# Patient Record
Sex: Female | Born: 1935 | Race: White | Hispanic: No | Smoking: Former smoker
Health system: Southern US, Community
[De-identification: ages and names within clinical notes are randomized; demographics above are authoritative.]

## PROBLEM LIST (undated history)

## (undated) DIAGNOSIS — K5730 Diverticulosis of large intestine without perforation or abscess without bleeding: Secondary | ICD-10-CM

## (undated) DIAGNOSIS — K644 Residual hemorrhoidal skin tags: Secondary | ICD-10-CM

## (undated) DIAGNOSIS — M8580 Other specified disorders of bone density and structure, unspecified site: Secondary | ICD-10-CM

## (undated) DIAGNOSIS — D751 Secondary polycythemia: Secondary | ICD-10-CM

## (undated) DIAGNOSIS — I4891 Unspecified atrial fibrillation: Secondary | ICD-10-CM

## (undated) DIAGNOSIS — E785 Hyperlipidemia, unspecified: Secondary | ICD-10-CM

## (undated) DIAGNOSIS — C549 Malignant neoplasm of corpus uteri, unspecified: Secondary | ICD-10-CM

## (undated) DIAGNOSIS — J189 Pneumonia, unspecified organism: Secondary | ICD-10-CM

## (undated) DIAGNOSIS — I10 Essential (primary) hypertension: Secondary | ICD-10-CM

## (undated) DIAGNOSIS — L03039 Cellulitis of unspecified toe: Secondary | ICD-10-CM

## (undated) DIAGNOSIS — I517 Cardiomegaly: Secondary | ICD-10-CM

## (undated) DIAGNOSIS — I639 Cerebral infarction, unspecified: Secondary | ICD-10-CM

## (undated) DIAGNOSIS — D126 Benign neoplasm of colon, unspecified: Secondary | ICD-10-CM

## (undated) HISTORY — DX: Residual hemorrhoidal skin tags: K64.4

## (undated) HISTORY — DX: Other specified disorders of bone density and structure, unspecified site: M85.80

## (undated) HISTORY — DX: Cellulitis of unspecified toe: L03.039

## (undated) HISTORY — PX: TONSILLECTOMY: SUR1361

## (undated) HISTORY — DX: Pneumonia, unspecified organism: J18.9

## (undated) HISTORY — PX: GYNECOLOGIC CRYOSURGERY: SHX857

## (undated) HISTORY — DX: Cerebral infarction, unspecified: I63.9

## (undated) HISTORY — PX: LUMBAR DISC SURGERY: SHX700

## (undated) HISTORY — DX: Hyperlipidemia, unspecified: E78.5

## (undated) HISTORY — DX: Essential (primary) hypertension: I10

## (undated) HISTORY — DX: Unspecified atrial fibrillation: I48.91

## (undated) HISTORY — DX: Benign neoplasm of colon, unspecified: D12.6

## (undated) HISTORY — DX: Diverticulosis of large intestine without perforation or abscess without bleeding: K57.30

## (undated) HISTORY — PX: CATARACT EXTRACTION: SUR2

## (undated) HISTORY — PX: REFRACTIVE SURGERY: SHX103

## (undated) HISTORY — DX: Cardiomegaly: I51.7

## (undated) HISTORY — DX: Secondary polycythemia: D75.1

## (undated) HISTORY — DX: Malignant neoplasm of corpus uteri, unspecified: C54.9

---

## 1999-01-08 ENCOUNTER — Other Ambulatory Visit: Admission: RE | Admit: 1999-01-08 | Discharge: 1999-01-08 | Payer: Self-pay | Admitting: Family Medicine

## 2000-03-17 ENCOUNTER — Other Ambulatory Visit: Admission: RE | Admit: 2000-03-17 | Discharge: 2000-03-17 | Payer: Self-pay | Admitting: Family Medicine

## 2001-04-29 ENCOUNTER — Other Ambulatory Visit: Admission: RE | Admit: 2001-04-29 | Discharge: 2001-04-29 | Payer: Self-pay | Admitting: Family Medicine

## 2002-04-27 ENCOUNTER — Other Ambulatory Visit: Admission: RE | Admit: 2002-04-27 | Discharge: 2002-04-27 | Payer: Self-pay | Admitting: Family Medicine

## 2003-06-13 ENCOUNTER — Other Ambulatory Visit: Admission: RE | Admit: 2003-06-13 | Discharge: 2003-06-13 | Payer: Self-pay | Admitting: Family Medicine

## 2003-09-22 HISTORY — PX: TOTAL ABDOMINAL HYSTERECTOMY W/ BILATERAL SALPINGOOPHORECTOMY: SHX83

## 2004-07-22 ENCOUNTER — Ambulatory Visit: Payer: Self-pay | Admitting: Family Medicine

## 2004-08-28 ENCOUNTER — Ambulatory Visit: Payer: Self-pay | Admitting: Family Medicine

## 2004-09-12 ENCOUNTER — Ambulatory Visit: Payer: Self-pay | Admitting: Family Medicine

## 2004-09-16 ENCOUNTER — Ambulatory Visit: Payer: Self-pay | Admitting: Family Medicine

## 2004-09-18 ENCOUNTER — Ambulatory Visit: Payer: Self-pay | Admitting: Family Medicine

## 2004-09-25 ENCOUNTER — Ambulatory Visit: Payer: Self-pay | Admitting: Family Medicine

## 2004-10-23 ENCOUNTER — Ambulatory Visit: Payer: Self-pay | Admitting: Family Medicine

## 2004-11-11 ENCOUNTER — Ambulatory Visit: Payer: Self-pay | Admitting: Family Medicine

## 2004-11-12 ENCOUNTER — Ambulatory Visit: Payer: Self-pay | Admitting: Family Medicine

## 2004-11-12 ENCOUNTER — Other Ambulatory Visit: Admission: RE | Admit: 2004-11-12 | Discharge: 2004-11-12 | Payer: Self-pay | Admitting: Family Medicine

## 2004-11-19 ENCOUNTER — Ambulatory Visit (HOSPITAL_COMMUNITY): Admission: RE | Admit: 2004-11-19 | Discharge: 2004-11-19 | Payer: Self-pay | Admitting: Gynecology

## 2004-11-20 ENCOUNTER — Ambulatory Visit: Payer: Self-pay | Admitting: Internal Medicine

## 2004-11-24 ENCOUNTER — Ambulatory Visit: Payer: Self-pay

## 2004-12-04 ENCOUNTER — Encounter (INDEPENDENT_AMBULATORY_CARE_PROVIDER_SITE_OTHER): Payer: Self-pay | Admitting: *Deleted

## 2004-12-05 ENCOUNTER — Inpatient Hospital Stay (HOSPITAL_COMMUNITY): Admission: RE | Admit: 2004-12-05 | Discharge: 2004-12-06 | Payer: Self-pay | Admitting: Gynecology

## 2004-12-19 ENCOUNTER — Ambulatory Visit: Payer: Self-pay | Admitting: Family Medicine

## 2005-01-16 ENCOUNTER — Ambulatory Visit: Payer: Self-pay | Admitting: Family Medicine

## 2005-01-30 ENCOUNTER — Ambulatory Visit: Payer: Self-pay | Admitting: Family Medicine

## 2005-02-13 ENCOUNTER — Ambulatory Visit: Admission: RE | Admit: 2005-02-13 | Discharge: 2005-05-14 | Payer: Self-pay | Admitting: Radiation Oncology

## 2005-03-02 ENCOUNTER — Ambulatory Visit: Payer: Self-pay | Admitting: Family Medicine

## 2005-03-20 ENCOUNTER — Ambulatory Visit: Payer: Self-pay | Admitting: Family Medicine

## 2005-03-30 ENCOUNTER — Ambulatory Visit: Payer: Self-pay | Admitting: Family Medicine

## 2005-04-13 ENCOUNTER — Ambulatory Visit: Payer: Self-pay | Admitting: Family Medicine

## 2005-04-23 ENCOUNTER — Ambulatory Visit (HOSPITAL_COMMUNITY): Admission: RE | Admit: 2005-04-23 | Discharge: 2005-04-23 | Payer: Self-pay | Admitting: Radiation Oncology

## 2005-04-27 ENCOUNTER — Ambulatory Visit: Payer: Self-pay | Admitting: Family Medicine

## 2005-04-30 ENCOUNTER — Ambulatory Visit (HOSPITAL_COMMUNITY): Admission: RE | Admit: 2005-04-30 | Discharge: 2005-04-30 | Payer: Self-pay | Admitting: Radiation Oncology

## 2005-05-07 ENCOUNTER — Ambulatory Visit (HOSPITAL_COMMUNITY): Admission: RE | Admit: 2005-05-07 | Discharge: 2005-05-07 | Payer: Self-pay | Admitting: Radiation Oncology

## 2005-05-11 ENCOUNTER — Ambulatory Visit: Payer: Self-pay | Admitting: Family Medicine

## 2005-05-26 ENCOUNTER — Ambulatory Visit: Payer: Self-pay | Admitting: Family Medicine

## 2005-05-29 ENCOUNTER — Ambulatory Visit: Payer: Self-pay | Admitting: Internal Medicine

## 2005-06-09 ENCOUNTER — Ambulatory Visit: Payer: Self-pay | Admitting: Family Medicine

## 2005-06-22 ENCOUNTER — Ambulatory Visit: Admission: RE | Admit: 2005-06-22 | Discharge: 2005-06-26 | Payer: Self-pay | Admitting: Radiation Oncology

## 2005-06-23 ENCOUNTER — Ambulatory Visit (HOSPITAL_COMMUNITY): Admission: RE | Admit: 2005-06-23 | Discharge: 2005-06-23 | Payer: Self-pay | Admitting: Radiation Oncology

## 2005-07-07 ENCOUNTER — Ambulatory Visit: Payer: Self-pay | Admitting: Family Medicine

## 2005-07-13 ENCOUNTER — Encounter: Payer: Self-pay | Admitting: Family Medicine

## 2005-07-13 LAB — CONVERTED CEMR LAB: Pap Smear: ABNORMAL

## 2005-08-04 ENCOUNTER — Ambulatory Visit: Payer: Self-pay | Admitting: Family Medicine

## 2005-09-01 ENCOUNTER — Ambulatory Visit: Payer: Self-pay | Admitting: Family Medicine

## 2005-10-02 ENCOUNTER — Ambulatory Visit: Payer: Self-pay | Admitting: Family Medicine

## 2005-10-06 ENCOUNTER — Ambulatory Visit: Payer: Self-pay | Admitting: Family Medicine

## 2005-10-12 ENCOUNTER — Ambulatory Visit: Payer: Self-pay | Admitting: Internal Medicine

## 2005-10-14 ENCOUNTER — Ambulatory Visit: Payer: Self-pay | Admitting: Family Medicine

## 2005-10-22 ENCOUNTER — Ambulatory Visit: Payer: Self-pay | Admitting: Internal Medicine

## 2005-10-22 ENCOUNTER — Encounter: Payer: Self-pay | Admitting: Internal Medicine

## 2005-10-22 ENCOUNTER — Encounter (INDEPENDENT_AMBULATORY_CARE_PROVIDER_SITE_OTHER): Payer: Self-pay | Admitting: Specialist

## 2005-10-22 DIAGNOSIS — D126 Benign neoplasm of colon, unspecified: Secondary | ICD-10-CM

## 2005-10-22 HISTORY — DX: Benign neoplasm of colon, unspecified: D12.6

## 2005-10-22 HISTORY — PX: COLONOSCOPY: SHX174

## 2005-10-28 ENCOUNTER — Ambulatory Visit: Payer: Self-pay | Admitting: Family Medicine

## 2005-11-18 ENCOUNTER — Ambulatory Visit: Payer: Self-pay | Admitting: Family Medicine

## 2005-12-02 ENCOUNTER — Ambulatory Visit: Payer: Self-pay | Admitting: Family Medicine

## 2006-01-06 ENCOUNTER — Ambulatory Visit: Payer: Self-pay | Admitting: Family Medicine

## 2006-02-03 ENCOUNTER — Ambulatory Visit: Payer: Self-pay | Admitting: Family Medicine

## 2006-02-17 ENCOUNTER — Ambulatory Visit: Payer: Self-pay | Admitting: Family Medicine

## 2006-03-03 ENCOUNTER — Ambulatory Visit: Payer: Self-pay | Admitting: Family Medicine

## 2006-03-17 ENCOUNTER — Ambulatory Visit: Payer: Self-pay | Admitting: Family Medicine

## 2006-03-30 ENCOUNTER — Ambulatory Visit (HOSPITAL_COMMUNITY): Admission: RE | Admit: 2006-03-30 | Discharge: 2006-03-30 | Payer: Self-pay | Admitting: Obstetrics and Gynecology

## 2006-03-31 ENCOUNTER — Ambulatory Visit: Payer: Self-pay | Admitting: Family Medicine

## 2006-04-02 ENCOUNTER — Ambulatory Visit (HOSPITAL_COMMUNITY): Admission: RE | Admit: 2006-04-02 | Discharge: 2006-04-02 | Payer: Self-pay | Admitting: Obstetrics and Gynecology

## 2006-04-06 ENCOUNTER — Ambulatory Visit: Payer: Self-pay | Admitting: Family Medicine

## 2006-04-28 ENCOUNTER — Ambulatory Visit: Payer: Self-pay | Admitting: Family Medicine

## 2006-05-26 ENCOUNTER — Ambulatory Visit: Payer: Self-pay | Admitting: Family Medicine

## 2006-06-09 ENCOUNTER — Ambulatory Visit: Payer: Self-pay | Admitting: Internal Medicine

## 2006-06-09 ENCOUNTER — Ambulatory Visit: Payer: Self-pay | Admitting: Family Medicine

## 2006-06-30 ENCOUNTER — Ambulatory Visit: Payer: Self-pay | Admitting: Family Medicine

## 2006-07-09 ENCOUNTER — Ambulatory Visit: Payer: Self-pay | Admitting: Family Medicine

## 2006-07-22 ENCOUNTER — Ambulatory Visit: Payer: Self-pay | Admitting: Family Medicine

## 2006-08-13 ENCOUNTER — Ambulatory Visit: Payer: Self-pay | Admitting: Family Medicine

## 2006-09-10 ENCOUNTER — Ambulatory Visit: Payer: Self-pay | Admitting: Family Medicine

## 2006-10-06 ENCOUNTER — Ambulatory Visit: Payer: Self-pay | Admitting: Family Medicine

## 2006-10-06 LAB — CONVERTED CEMR LAB
ALT: 14 units/L (ref 0–40)
AST: 18 units/L (ref 0–37)
Albumin: 3.9 g/dL (ref 3.5–5.2)
Alkaline Phosphatase: 66 units/L (ref 39–117)
BUN: 21 mg/dL (ref 6–23)
Bilirubin, Direct: 0.1 mg/dL (ref 0.0–0.3)
CO2: 33 meq/L — ABNORMAL HIGH (ref 19–32)
Calcium: 9.8 mg/dL (ref 8.4–10.5)
Chloride: 96 meq/L (ref 96–112)
Cholesterol: 164 mg/dL (ref 0–200)
Creatinine, Ser: 1.1 mg/dL (ref 0.4–1.2)
GFR calc Af Amer: 62 mL/min
GFR calc non Af Amer: 51 mL/min
Glucose, Bld: 117 mg/dL — ABNORMAL HIGH (ref 70–99)
HCT: 48 % — ABNORMAL HIGH (ref 36.0–46.0)
HDL: 64.2 mg/dL (ref >39.0)
Hemoglobin: 15.3 g/dL — ABNORMAL HIGH (ref 12.0–15.0)
LDL Cholesterol: 72 mg/dL (ref 0–99)
MCHC: 32 g/dL (ref 30.0–36.0)
MCV: 89.6 fL (ref 78.0–100.0)
Phosphorus: 3.4 mg/dL (ref 2.3–4.6)
Platelets: 243 10*3/uL (ref 150–400)
Potassium: 4 meq/L (ref 3.5–5.1)
RBC: 5.36 M/uL — ABNORMAL HIGH (ref 3.87–5.11)
RDW: 12.5 % (ref 11.5–14.6)
Sodium: 137 meq/L (ref 135–145)
TSH: 2.44 microintl units/mL (ref 0.35–5.50)
Total Bilirubin: 0.8 mg/dL (ref 0.3–1.2)
Total CHOL/HDL Ratio: 2.6
Total Protein: 7.1 g/dL (ref 6.0–8.3)
Triglycerides: 140 mg/dL (ref 0–149)
VLDL: 28 mg/dL (ref 0–40)
WBC: 10.5 10*3/uL (ref 4.5–10.5)

## 2006-10-08 ENCOUNTER — Ambulatory Visit: Payer: Self-pay | Admitting: Family Medicine

## 2006-10-14 ENCOUNTER — Ambulatory Visit: Payer: Self-pay | Admitting: Family Medicine

## 2006-10-22 ENCOUNTER — Ambulatory Visit: Payer: Self-pay | Admitting: Family Medicine

## 2006-10-29 ENCOUNTER — Ambulatory Visit: Payer: Self-pay | Admitting: Family Medicine

## 2006-11-11 ENCOUNTER — Ambulatory Visit: Payer: Self-pay | Admitting: Family Medicine

## 2006-12-09 ENCOUNTER — Ambulatory Visit: Payer: Self-pay | Admitting: Family Medicine

## 2006-12-23 ENCOUNTER — Ambulatory Visit: Payer: Self-pay | Admitting: Family Medicine

## 2007-01-06 ENCOUNTER — Ambulatory Visit: Payer: Self-pay | Admitting: Family Medicine

## 2007-01-31 ENCOUNTER — Ambulatory Visit (HOSPITAL_COMMUNITY): Admission: RE | Admit: 2007-01-31 | Discharge: 2007-01-31 | Payer: Self-pay | Admitting: Obstetrics and Gynecology

## 2007-02-02 ENCOUNTER — Ambulatory Visit (HOSPITAL_COMMUNITY): Admission: RE | Admit: 2007-02-02 | Discharge: 2007-02-02 | Payer: Self-pay | Admitting: Obstetrics and Gynecology

## 2007-02-03 ENCOUNTER — Ambulatory Visit: Payer: Self-pay | Admitting: Family Medicine

## 2007-02-03 DIAGNOSIS — I4891 Unspecified atrial fibrillation: Secondary | ICD-10-CM

## 2007-02-03 LAB — CONVERTED CEMR LAB
INR: 2
Prothrombin Time: 17.6 s

## 2007-02-22 ENCOUNTER — Ambulatory Visit: Payer: Self-pay | Admitting: Family Medicine

## 2007-02-22 DIAGNOSIS — Z8542 Personal history of malignant neoplasm of other parts of uterus: Secondary | ICD-10-CM

## 2007-02-22 DIAGNOSIS — I10 Essential (primary) hypertension: Secondary | ICD-10-CM | POA: Insufficient documentation

## 2007-02-22 DIAGNOSIS — L2089 Other atopic dermatitis: Secondary | ICD-10-CM

## 2007-02-22 DIAGNOSIS — E7800 Pure hypercholesterolemia, unspecified: Secondary | ICD-10-CM

## 2007-02-22 DIAGNOSIS — J45909 Unspecified asthma, uncomplicated: Secondary | ICD-10-CM | POA: Insufficient documentation

## 2007-02-22 DIAGNOSIS — K5730 Diverticulosis of large intestine without perforation or abscess without bleeding: Secondary | ICD-10-CM | POA: Insufficient documentation

## 2007-02-22 LAB — CONVERTED CEMR LAB
Cholesterol, target level: 200 mg/dL
HDL goal, serum: 40 mg/dL
LDL Goal: 160 mg/dL

## 2007-03-04 ENCOUNTER — Ambulatory Visit: Payer: Self-pay | Admitting: Family Medicine

## 2007-03-04 LAB — CONVERTED CEMR LAB
INR: 1.8
Prothrombin Time: 16.5 s

## 2007-03-18 ENCOUNTER — Ambulatory Visit: Payer: Self-pay | Admitting: Family Medicine

## 2007-03-18 LAB — CONVERTED CEMR LAB
INR: 1.9
Prothrombin Time: 17 s

## 2007-04-08 ENCOUNTER — Ambulatory Visit: Payer: Self-pay | Admitting: Family Medicine

## 2007-05-06 ENCOUNTER — Ambulatory Visit: Payer: Self-pay | Admitting: Family Medicine

## 2007-05-06 LAB — CONVERTED CEMR LAB: Prothrombin Time: 22.2 s

## 2007-05-20 ENCOUNTER — Ambulatory Visit: Payer: Self-pay | Admitting: Family Medicine

## 2007-05-20 LAB — CONVERTED CEMR LAB: INR: 2.8

## 2007-05-24 ENCOUNTER — Ambulatory Visit: Payer: Self-pay | Admitting: Internal Medicine

## 2007-06-17 ENCOUNTER — Ambulatory Visit: Payer: Self-pay | Admitting: Internal Medicine

## 2007-06-21 LAB — CONVERTED CEMR LAB: INR: 1.9

## 2007-07-15 ENCOUNTER — Ambulatory Visit: Payer: Self-pay | Admitting: Family Medicine

## 2007-07-15 LAB — CONVERTED CEMR LAB: INR: 1.9

## 2007-08-12 ENCOUNTER — Ambulatory Visit: Payer: Self-pay | Admitting: Family Medicine

## 2007-09-09 ENCOUNTER — Ambulatory Visit: Payer: Self-pay | Admitting: Family Medicine

## 2007-09-09 LAB — CONVERTED CEMR LAB: INR: 1.7

## 2007-09-23 ENCOUNTER — Ambulatory Visit: Payer: Self-pay | Admitting: Family Medicine

## 2007-09-23 LAB — CONVERTED CEMR LAB: Prothrombin Time: 17.7 s

## 2007-09-25 LAB — CONVERTED CEMR LAB
Albumin: 3.4 g/dL — ABNORMAL LOW (ref 3.5–5.2)
Alkaline Phosphatase: 66 units/L (ref 39–117)
BUN: 17 mg/dL (ref 6–23)
Basophils Absolute: 0 10*3/uL (ref 0.0–0.1)
Cholesterol: 141 mg/dL (ref 0–200)
GFR calc Af Amer: 78 mL/min
HDL: 55.4 mg/dL (ref >39.0)
Hemoglobin: 14.7 g/dL (ref 12.0–15.0)
LDL Cholesterol: 60 mg/dL (ref 0–99)
Lymphocytes Relative: 15.3 % (ref 12.0–46.0)
MCHC: 33.2 g/dL (ref 30.0–36.0)
MCV: 88.6 fL (ref 78.0–100.0)
Monocytes Absolute: 0.9 10*3/uL — ABNORMAL HIGH (ref 0.2–0.7)
Monocytes Relative: 12 % — ABNORMAL HIGH (ref 3.0–11.0)
Neutro Abs: 5 10*3/uL (ref 1.4–7.7)
Potassium: 3.9 meq/L (ref 3.5–5.1)
TSH: 3.03 microintl units/mL (ref 0.35–5.50)
Total Protein: 6.6 g/dL (ref 6.0–8.3)

## 2007-09-28 ENCOUNTER — Ambulatory Visit: Payer: Self-pay | Admitting: Family Medicine

## 2007-10-13 ENCOUNTER — Ambulatory Visit: Payer: Self-pay | Admitting: Family Medicine

## 2007-10-21 ENCOUNTER — Ambulatory Visit: Payer: Self-pay | Admitting: Family Medicine

## 2007-10-21 LAB — CONVERTED CEMR LAB
INR: 1.9
Prothrombin Time: 17.1 s

## 2007-10-26 ENCOUNTER — Encounter: Payer: Self-pay | Admitting: Family Medicine

## 2007-11-18 ENCOUNTER — Ambulatory Visit: Payer: Self-pay | Admitting: Family Medicine

## 2007-11-18 LAB — CONVERTED CEMR LAB: Prothrombin Time: 17.5 s

## 2007-11-25 ENCOUNTER — Ambulatory Visit: Payer: Self-pay | Admitting: Family Medicine

## 2007-11-29 ENCOUNTER — Encounter (INDEPENDENT_AMBULATORY_CARE_PROVIDER_SITE_OTHER): Payer: Self-pay | Admitting: *Deleted

## 2007-12-16 ENCOUNTER — Ambulatory Visit: Payer: Self-pay | Admitting: Family Medicine

## 2008-01-13 ENCOUNTER — Ambulatory Visit: Payer: Self-pay | Admitting: Family Medicine

## 2008-01-13 LAB — CONVERTED CEMR LAB: Prothrombin Time: 18.1 s

## 2008-02-08 ENCOUNTER — Encounter: Payer: Self-pay | Admitting: Family Medicine

## 2008-02-08 ENCOUNTER — Ambulatory Visit (HOSPITAL_COMMUNITY): Admission: RE | Admit: 2008-02-08 | Discharge: 2008-02-08 | Payer: Self-pay | Admitting: Obstetrics and Gynecology

## 2008-02-10 ENCOUNTER — Ambulatory Visit: Payer: Self-pay | Admitting: Family Medicine

## 2008-02-10 LAB — CONVERTED CEMR LAB: INR: 2.5

## 2008-03-08 ENCOUNTER — Ambulatory Visit: Payer: Self-pay | Admitting: Family Medicine

## 2008-03-08 LAB — CONVERTED CEMR LAB: Prothrombin Time: 2.5 s

## 2008-03-29 ENCOUNTER — Ambulatory Visit: Payer: Self-pay | Admitting: Family Medicine

## 2008-04-26 ENCOUNTER — Ambulatory Visit: Payer: Self-pay | Admitting: Family Medicine

## 2008-05-24 ENCOUNTER — Ambulatory Visit: Payer: Self-pay | Admitting: Family Medicine

## 2008-05-24 LAB — CONVERTED CEMR LAB: Prothrombin Time: 22.6 s

## 2008-06-07 ENCOUNTER — Ambulatory Visit: Payer: Self-pay | Admitting: Family Medicine

## 2008-06-07 LAB — CONVERTED CEMR LAB
INR: 2.7
Prothrombin Time: 19.9 s

## 2008-07-05 ENCOUNTER — Ambulatory Visit: Payer: Self-pay | Admitting: Family Medicine

## 2008-07-06 LAB — CONVERTED CEMR LAB: Prothrombin Time: 16.9 s — ABNORMAL HIGH (ref 10.9–13.3)

## 2008-07-20 ENCOUNTER — Ambulatory Visit: Payer: Self-pay | Admitting: Family Medicine

## 2008-07-20 LAB — CONVERTED CEMR LAB: INR: 3.3

## 2008-08-03 ENCOUNTER — Ambulatory Visit: Payer: Self-pay | Admitting: Family Medicine

## 2008-08-31 ENCOUNTER — Ambulatory Visit: Payer: Self-pay | Admitting: Family Medicine

## 2008-08-31 LAB — CONVERTED CEMR LAB
INR: 2.2
Prothrombin Time: 18.1 s

## 2008-09-19 ENCOUNTER — Ambulatory Visit: Payer: Self-pay | Admitting: Family Medicine

## 2008-09-19 LAB — CONVERTED CEMR LAB
Albumin: 3.7 g/dL (ref 3.5–5.2)
Alkaline Phosphatase: 64 units/L (ref 39–117)
Basophils Absolute: 0 10*3/uL (ref 0.0–0.1)
Bilirubin, Direct: 0.1 mg/dL (ref 0.0–0.3)
Calcium: 10 mg/dL (ref 8.4–10.5)
Cholesterol: 153 mg/dL (ref 0–200)
Eosinophils Absolute: 0.3 10*3/uL (ref 0.0–0.7)
GFR calc Af Amer: 62 mL/min
GFR calc non Af Amer: 51 mL/min
Glucose, Bld: 105 mg/dL — ABNORMAL HIGH (ref 70–99)
HCT: 45.2 % (ref 36.0–46.0)
HDL: 67.9 mg/dL (ref >39.0)
Hemoglobin: 15.3 g/dL — ABNORMAL HIGH (ref 12.0–15.0)
MCHC: 33.8 g/dL (ref 30.0–36.0)
MCV: 90.8 fL (ref 78.0–100.0)
Monocytes Absolute: 0.7 10*3/uL (ref 0.1–1.0)
Neutro Abs: 4.9 10*3/uL (ref 1.4–7.7)
Platelets: 200 10*3/uL (ref 150–400)
Potassium: 4.1 meq/L (ref 3.5–5.1)
RDW: 13.1 % (ref 11.5–14.6)
Sodium: 139 meq/L (ref 135–145)
Total CHOL/HDL Ratio: 2.3
Total Protein: 6.9 g/dL (ref 6.0–8.3)
Triglycerides: 174 mg/dL — ABNORMAL HIGH (ref 0–149)

## 2008-09-22 LAB — CONVERTED CEMR LAB: Vit D, 1,25-Dihydroxy: 24 — ABNORMAL LOW (ref 30–89)

## 2008-09-27 ENCOUNTER — Ambulatory Visit: Payer: Self-pay | Admitting: Family Medicine

## 2008-10-04 ENCOUNTER — Ambulatory Visit: Payer: Self-pay | Admitting: Family Medicine

## 2008-10-04 LAB — CONVERTED CEMR LAB
INR: 1.8
Prothrombin Time: 16.4 s

## 2008-10-18 ENCOUNTER — Ambulatory Visit: Payer: Self-pay | Admitting: Family Medicine

## 2008-10-18 LAB — CONVERTED CEMR LAB: INR: 1.8

## 2008-10-29 ENCOUNTER — Encounter: Payer: Self-pay | Admitting: Family Medicine

## 2008-10-31 ENCOUNTER — Encounter (INDEPENDENT_AMBULATORY_CARE_PROVIDER_SITE_OTHER): Payer: Self-pay | Admitting: *Deleted

## 2008-11-01 ENCOUNTER — Ambulatory Visit: Payer: Self-pay | Admitting: Family Medicine

## 2008-11-01 LAB — CONVERTED CEMR LAB: Prothrombin Time: 17.1 s

## 2008-11-08 ENCOUNTER — Ambulatory Visit: Payer: Self-pay | Admitting: Family Medicine

## 2008-11-08 ENCOUNTER — Encounter (INDEPENDENT_AMBULATORY_CARE_PROVIDER_SITE_OTHER): Payer: Self-pay | Admitting: *Deleted

## 2008-11-08 LAB — FECAL OCCULT BLOOD, GUAIAC: Fecal Occult Blood: NEGATIVE

## 2008-11-08 LAB — CONVERTED CEMR LAB
OCCULT 2: NEGATIVE
OCCULT 3: NEGATIVE

## 2008-11-15 ENCOUNTER — Ambulatory Visit: Payer: Self-pay | Admitting: Family Medicine

## 2008-11-15 LAB — CONVERTED CEMR LAB: INR: 2.3

## 2008-12-13 ENCOUNTER — Ambulatory Visit: Payer: Self-pay | Admitting: Family Medicine

## 2008-12-13 LAB — CONVERTED CEMR LAB
INR: 2
Prothrombin Time: 17.5 s

## 2009-01-10 ENCOUNTER — Ambulatory Visit: Payer: Self-pay | Admitting: Family Medicine

## 2009-02-07 ENCOUNTER — Ambulatory Visit: Payer: Self-pay | Admitting: Family Medicine

## 2009-02-07 LAB — CONVERTED CEMR LAB: Prothrombin Time: 18.5 s

## 2009-03-07 ENCOUNTER — Ambulatory Visit: Payer: Self-pay | Admitting: Family Medicine

## 2009-03-07 LAB — CONVERTED CEMR LAB: INR: 2.3

## 2009-04-04 ENCOUNTER — Ambulatory Visit: Payer: Self-pay | Admitting: Family Medicine

## 2009-04-04 LAB — CONVERTED CEMR LAB: Prothrombin Time: 14.5 s

## 2009-04-16 ENCOUNTER — Ambulatory Visit (HOSPITAL_COMMUNITY): Admission: RE | Admit: 2009-04-16 | Discharge: 2009-04-16 | Payer: Self-pay | Admitting: Obstetrics and Gynecology

## 2009-04-18 ENCOUNTER — Ambulatory Visit: Payer: Self-pay | Admitting: Internal Medicine

## 2009-04-18 LAB — CONVERTED CEMR LAB: Prothrombin Time: 16.5 s

## 2009-05-02 ENCOUNTER — Ambulatory Visit: Payer: Self-pay | Admitting: Internal Medicine

## 2009-05-30 ENCOUNTER — Ambulatory Visit: Payer: Self-pay | Admitting: Internal Medicine

## 2009-05-30 LAB — CONVERTED CEMR LAB
INR: 2.4
Prothrombin Time: 18.8 s

## 2009-06-26 ENCOUNTER — Ambulatory Visit: Payer: Self-pay | Admitting: Internal Medicine

## 2009-06-26 LAB — CONVERTED CEMR LAB: INR: 2.6

## 2009-07-25 ENCOUNTER — Ambulatory Visit: Payer: Self-pay | Admitting: Internal Medicine

## 2009-07-25 LAB — CONVERTED CEMR LAB: Prothrombin Time: 19.8 s

## 2009-08-22 ENCOUNTER — Ambulatory Visit: Payer: Self-pay | Admitting: Internal Medicine

## 2009-09-19 ENCOUNTER — Ambulatory Visit: Payer: Self-pay | Admitting: Internal Medicine

## 2009-09-30 ENCOUNTER — Ambulatory Visit: Payer: Self-pay | Admitting: Internal Medicine

## 2009-09-30 LAB — CONVERTED CEMR LAB: INR: 2

## 2009-10-18 ENCOUNTER — Ambulatory Visit: Payer: Self-pay | Admitting: Internal Medicine

## 2009-10-18 LAB — CONVERTED CEMR LAB
INR: 1.6
Prothrombin Time: 15.8 s

## 2009-11-01 ENCOUNTER — Ambulatory Visit: Payer: Self-pay | Admitting: Internal Medicine

## 2009-11-11 ENCOUNTER — Telehealth (INDEPENDENT_AMBULATORY_CARE_PROVIDER_SITE_OTHER): Payer: Self-pay | Admitting: *Deleted

## 2009-11-12 ENCOUNTER — Ambulatory Visit: Payer: Self-pay | Admitting: Family Medicine

## 2009-11-22 ENCOUNTER — Ambulatory Visit: Payer: Self-pay | Admitting: Family Medicine

## 2009-11-22 LAB — CONVERTED CEMR LAB
AST: 24 units/L (ref 0–37)
Albumin: 4 g/dL (ref 3.5–5.2)
Alkaline Phosphatase: 61 units/L (ref 39–117)
Basophils Absolute: 0 10*3/uL (ref 0.0–0.1)
Basophils Relative: 0.1 % (ref 0.0–3.0)
Calcium: 9.7 mg/dL (ref 8.4–10.5)
GFR calc non Af Amer: 45.87 mL/min (ref >60)
HDL: 73.7 mg/dL (ref >39.00)
Hemoglobin: 15.2 g/dL — ABNORMAL HIGH (ref 12.0–15.0)
LDL Cholesterol: 43 mg/dL (ref 0–99)
Lymphocytes Relative: 17.9 % (ref 12.0–46.0)
Monocytes Relative: 12.3 % — ABNORMAL HIGH (ref 3.0–12.0)
Neutro Abs: 5.3 10*3/uL (ref 1.4–7.7)
RBC: 5.01 M/uL (ref 3.87–5.11)
RDW: 13 % (ref 11.5–14.6)
Sodium: 139 meq/L (ref 135–145)
Total CHOL/HDL Ratio: 2
VLDL: 29.8 mg/dL (ref 0.0–40.0)

## 2009-11-26 ENCOUNTER — Ambulatory Visit: Payer: Self-pay | Admitting: Family Medicine

## 2009-12-10 ENCOUNTER — Ambulatory Visit: Payer: Self-pay | Admitting: Family Medicine

## 2009-12-10 LAB — CONVERTED CEMR LAB: INR: 2.2

## 2009-12-12 ENCOUNTER — Encounter: Payer: Self-pay | Admitting: Family Medicine

## 2009-12-16 ENCOUNTER — Encounter: Payer: Self-pay | Admitting: Family Medicine

## 2009-12-18 ENCOUNTER — Telehealth: Payer: Self-pay | Admitting: Family Medicine

## 2009-12-18 ENCOUNTER — Encounter: Payer: Self-pay | Admitting: Family Medicine

## 2010-01-06 ENCOUNTER — Encounter: Payer: Self-pay | Admitting: Family Medicine

## 2010-01-13 ENCOUNTER — Ambulatory Visit: Payer: Self-pay | Admitting: Family Medicine

## 2010-01-13 LAB — CONVERTED CEMR LAB: Prothrombin Time: 17.6 s

## 2010-01-27 ENCOUNTER — Ambulatory Visit: Payer: Self-pay | Admitting: Family Medicine

## 2010-01-27 LAB — CONVERTED CEMR LAB
INR: 2.1
Prothrombin Time: 24.8 s

## 2010-02-24 ENCOUNTER — Ambulatory Visit: Payer: Self-pay | Admitting: Family Medicine

## 2010-03-25 ENCOUNTER — Ambulatory Visit: Payer: Self-pay | Admitting: Family Medicine

## 2010-03-25 LAB — CONVERTED CEMR LAB: INR: 1.5

## 2010-04-01 ENCOUNTER — Ambulatory Visit: Payer: Self-pay | Admitting: Family Medicine

## 2010-04-15 ENCOUNTER — Ambulatory Visit: Payer: Self-pay | Admitting: Family Medicine

## 2010-04-15 LAB — CONVERTED CEMR LAB
INR: 1.7
Prothrombin Time: 20 s

## 2010-04-25 ENCOUNTER — Ambulatory Visit (HOSPITAL_COMMUNITY): Admission: RE | Admit: 2010-04-25 | Discharge: 2010-04-25 | Payer: Self-pay | Admitting: Obstetrics and Gynecology

## 2010-04-29 ENCOUNTER — Ambulatory Visit: Payer: Self-pay | Admitting: Family Medicine

## 2010-04-29 ENCOUNTER — Encounter (INDEPENDENT_AMBULATORY_CARE_PROVIDER_SITE_OTHER): Payer: Self-pay | Admitting: *Deleted

## 2010-04-29 LAB — CONVERTED CEMR LAB
INR: 2.4
Prothrombin Time: 28.4 s

## 2010-05-01 ENCOUNTER — Encounter: Payer: Self-pay | Admitting: Family Medicine

## 2010-05-27 ENCOUNTER — Ambulatory Visit: Payer: Self-pay | Admitting: Family Medicine

## 2010-05-27 LAB — CONVERTED CEMR LAB: Prothrombin Time: 34.8 s

## 2010-06-08 ENCOUNTER — Encounter: Payer: Self-pay | Admitting: Family Medicine

## 2010-06-10 ENCOUNTER — Ambulatory Visit: Payer: Self-pay | Admitting: Family Medicine

## 2010-06-24 ENCOUNTER — Ambulatory Visit: Payer: Self-pay | Admitting: Family Medicine

## 2010-06-30 ENCOUNTER — Encounter: Payer: Self-pay | Admitting: Family Medicine

## 2010-07-01 ENCOUNTER — Encounter: Payer: Self-pay | Admitting: Family Medicine

## 2010-07-22 ENCOUNTER — Ambulatory Visit: Payer: Self-pay | Admitting: Family Medicine

## 2010-08-21 ENCOUNTER — Ambulatory Visit: Payer: Self-pay | Admitting: Family Medicine

## 2010-08-21 LAB — CONVERTED CEMR LAB: INR: 2.3

## 2010-09-18 ENCOUNTER — Ambulatory Visit: Payer: Self-pay | Admitting: Family Medicine

## 2010-09-18 LAB — CONVERTED CEMR LAB: INR: 3.9

## 2010-10-09 ENCOUNTER — Ambulatory Visit
Admission: RE | Admit: 2010-10-09 | Discharge: 2010-10-09 | Payer: Self-pay | Source: Home / Self Care | Attending: Family Medicine | Admitting: Family Medicine

## 2010-10-09 LAB — CONVERTED CEMR LAB: Prothrombin Time: 37.8 s

## 2010-10-21 NOTE — Letter (Signed)
Summary: [facility],Order for Left Breast Stereo Core Biopsy  [facility],Order for Left Breast Stereo Core Biopsy   Imported By: Beau Fanny 12/19/2009 11:46:05  _____________________________________________________________________  External Attachment:    Type:   Image     Comment:   External Document

## 2010-10-21 NOTE — Progress Notes (Signed)
Summary: nose bleed   Phone Note Call from Patient   Caller: Patient Call For: Shaune Leeks MD Summary of Call: Patient had a nose bleed for over an hour today. She has a  runny nose that she usually has. I looked up past INR's and they have been stable. She is concerened and thinks she should have INR done. We discussed this and I told her I would send you a phone note Initial call taken by: Mills Koller,  November 11, 2009 11:14 AM  Follow-up for Phone Call        Discussed with pt. Discussed approach to epistaxis. Also she has been on Ab for prolonged period for toe infection so will do PT to check clotting function. PT tomm. Follow-up by: Shaune Leeks MD,  November 11, 2009 1:32 PM  Additional Follow-up for Phone Call Additional follow up Details #1::        scheduled for 2.22.11, 11:30  Additional Follow-up by: Mills Koller,  November 11, 2009 2:06 PM

## 2010-10-21 NOTE — Assessment & Plan Note (Signed)
Summary: CPX/RBH   Vital Signs:  Patient profile:   77 year old female Height:      68.25 inches Weight:      201.25 pounds BMI:     30.49 Temp:     98.0 degrees F oral Pulse rate:   80 / minute Pulse rhythm:   regular BP sitting:   114 / 70  (left arm) Cuff size:   large  Vitals Entered By: Sydell Axon LPN (November 27, 5282 1:52 PM) CC: 30 Minute checkup, has her GYN care done in [place] at the [facility], had a colonoscopy 02/07 by Dr. Leone Payor   History of Present Illness: Pt here for followup. She was seen at [facility] for her Gyn followup. She is moving her mammo and radiologic followup to july.  She has no complaints and feels well. Vit D has helped her a lot.   Preventive Screening-Counseling & Management  Alcohol-Tobacco     Alcohol drinks/day: 0     Smoking Status: quit     Year Quit: 1988     Pack years: 15 yrs     Passive Smoke Exposure: no  Caffeine-Diet-Exercise     Caffeine use/day: 5+     Does Patient Exercise: no  Problems Prior to Update: 1)  Unspecified Menopausal&postmenopausal Disorder  (ICD-627.9) 2)  Onychia, Left Second Toenail  (ICD-681.11) 3)  Special Screening Malig Neoplasms Other Sites  (ICD-V76.49) 4)  Other Screening Mammogram  (ICD-V76.12) 5)  Dermatitis, Other Atopic  (ICD-691.8) 6)  Hx of Cancer, Endometrium  (ICD-182.0) 7)  Hx of Hypercholesterolemia  (ICD-272.0) 8)  Hx of Reactive Airway Disease  (ICD-493.90) 9)  Hypertension  (ICD-401.9) 10)  Diverticulosis, Colon  (ICD-562.10) 11)  Encounter For Therapeutic Drug Monitoring  (ICD-V58.83) 12)  Aftercare, Long-term Use, Anticoagulants  (ICD-V58.61) 13)  Atrial Fibrillation  (ICD-427.31)  Medications Prior to Update: 1)  Calcium .... Take 1000 Mg By Mouth Daily 2)  Zyrtec 10 Mg Tabs (Cetirizine Hcl) .... Take One By Mouth Daily 3)  Atenolol 50 Mg Tabs (Atenolol) .... Take One By Mouth Daily 4)  Klor-Con 10 10 Meq Tbcr (Potassium Chloride) .... Take One By Mouth Daily 5)   Pravastatin Sodium 40 Mg Tabs (Pravastatin Sodium) .... Take 2 Tablet By Mouth At Bedtime 6)  Coumadin 2.5 Mg Tabs (Warfarin Sodium) .... Take 1 Tablet By Mouth As Directed 7)  Triamterene-Hctz 75-50 Mg  Tabs (Triamterene-Hctz) .Marland Kitchen.. 1 Tablet Daily 8)  Warfarin Sodium 5 Mg  Tabs (Warfarin Sodium) .... As Directed  Allergies: 1)  ! Penicillin 2)  ! Steroids 3)  ! Amoxicillin  Review of Systems General:  Complains of fatigue; denies chills, fever, sweats, weakness, and weight loss; at times. Eyes:  Denies blurring, discharge, and eye pain. ENT:  Denies decreased hearing, earache, and ringing in ears; Familial hard of hearing.. CV:  Complains of shortness of breath with exertion; denies chest pain or discomfort, fainting, palpitations, swelling of feet, and swelling of hands; minimal. Resp:  Denies cough, shortness of breath, and wheezing. GI:  Complains of bloody stools and hemorrhoids; denies abdominal pain, change in bowel habits, constipation, dark tarry stools, diarrhea, indigestion, loss of appetite, nausea, vomiting, vomiting blood, and yellowish skin color. GU:  Denies discharge, nocturia, and urinary frequency. MS:  Complains of stiffness; denies joint pain, low back pain, muscle aches, cramps, and muscle weakness. Derm:  Denies dryness, itching, and rash. Neuro:  Denies numbness, poor balance, tingling, and tremors.  Physical Exam  General:  Well-developed,well-nourished,in  no acute distress; alert,appropriate and cooperative throughout examination Head:  Normocephalic and atraumatic without obvious abnormalities. No apparent alopecia or balding. Sinuses NT. Eyes:  Conjunctiva clear bilaterally.  Ears:  External ear exam shows no significant lesions or deformities.  Otoscopic examination reveals clear canals, tympanic membranes are intact bilaterally without bulging, retraction, inflammation or discharge. Hearing is grossly normal bilaterally. Nose:  External nasal examination  shows no deformity or inflammation. Nasal mucosa are pink and moist without lesions or exudates. Mouth:  Oral mucosa and oropharynx without lesions or exudates.  Teeth in good repair. Neck:  No deformities, masses, or tenderness noted. Chest Wall:  No deformities, masses, or tenderness noted. Breasts:  Not done. Lungs:  Normal respiratory effort, chest expands symmetrically. Lungs are clear to auscultation, no crackles or wheezes. Heart:  Irreg, irreg, good rate. Abdomen:  Bowel sounds positive,abdomen soft and non-tender without masses, organomegaly or hernias noted. Mildly obese. Rectal:  NOT done. Genitalia:  Not Done, had appt with gynonc in Jan  at [facility]. Msk:  No deformity or scoliosis noted of thoracic or lumbar spine.   Pulses:  R and L carotid,radial,femoral,dorsalis pedis and posterior tibial pulses are full and equal bilaterally Extremities:  No clubbing, cyanosis, edema, or deformity noted with normal full range of motion of all joints.   Neurologic:  No cranial nerve deficits noted. Station and gait are normal. Plantar reflexes are down-going bilaterally. DTRs are symmetrical throughout. Sensory, motor and coordinative functions appear intact. Skin:  Intact without suspicious lesions or rashes. Cervical Nodes:  No lymphadenopathy noted Inguinal Nodes:  No significant adenopathy Psych:  Cognition and judgment appear intact. Alert and cooperative with normal attention span and concentration. No apparent delusions, illusions, hallucinations   Impression & Recommendations:  Problem # 1:  Hx of HYPERCHOLESTEROLEMIA (ICD-272.0) Assessment Improved Great nos. Her updated medication list for this problem includes:    Pravastatin Sodium 40 Mg Tabs (Pravastatin sodium) .Marland Kitchen... Take 2 tablet by mouth at bedtime  Labs Reviewed: SGOT: 24 (11/22/2009)   SGPT: 20 (11/22/2009)  Lipid Goals: Chol Goal: 200 (02/22/2007)   HDL Goal: 40 (02/22/2007)   LDL Goal: 160 (02/22/2007)   TG Goal:  150 (02/22/2007)  Prior 10 Yr Risk Heart Disease: 5 % (02/22/2007)   HDL:73.70 (11/22/2009), 67.9 (09/19/2008)  LDL:43 (11/22/2009), 50 (09/19/2008)  Chol:146 (11/22/2009), 153 (09/19/2008)  Trig:149.0 (11/22/2009), 174 (09/19/2008)  Problem # 2:  Hx of CANCER, ENDOMETRIUM (ICD-182.0) Assessment: Unchanged Per [facility]. To have relook workup in July.  Problem # 3:  HYPERTENSION (ICD-401.9) Assessment: Unchanged Great. Her updated medication list for this problem includes:    Atenolol 50 Mg Tabs (Atenolol) .Marland Kitchen... Take one by mouth daily    Triamterene-hctz 75-50 Mg Tabs (Triamterene-hctz) .Marland Kitchen... 1 tablet daily  BP today: 114/70 Prior BP: 120/70 (09/27/2008)  Prior 10 Yr Risk Heart Disease: 5 % (02/22/2007)  Labs Reviewed: K+: 3.8 (11/22/2009) Creat: : 1.2 (11/22/2009)   Chol: 146 (11/22/2009)   HDL: 73.70 (11/22/2009)   LDL: 43 (11/22/2009)   TG: 149.0 (11/22/2009)  Problem # 4:  Hx of REACTIVE AIRWAY DISEASE (ICD-493.90) Assessment: Unchanged Stable.  Problem # 5:  DIVERTICULOSIS, COLON (ICD-562.10) Assessment: Unchanged  Discussed being seen for LLQ pain.  Labs Reviewed: Hgb: 15.2 (11/22/2009)   Hct: 46.7 (11/22/2009)   WBC: 8.0 (11/22/2009)  Problem # 6:  ATRIAL FIBRILLATION (ICD-427.31) Assessment: Unchanged  NSR when examined today. The following medications were removed from the medication list:    Coumadin 2.5 Mg Tabs (Warfarin sodium) .Marland Kitchen... Take  1 tablet by mouth as directed Her updated medication list for this problem includes:    Atenolol 50 Mg Tabs (Atenolol) .Marland Kitchen... Take one by mouth daily    Warfarin Sodium 5 Mg Tabs (Warfarin sodium) .Marland Kitchen... As directed  Reviewed the following: PT: 17.5 (11/01/2009)   INR: 2.2 (11/12/2009) Next Protime: 4 weeks (dated on 11/12/2009)  Complete Medication List: 1)  Calcium  .... Take 1000 mg by mouth daily 2)  Zyrtec 10 Mg Tabs (Cetirizine hcl) .... Take one by mouth daily as needed 3)  Atenolol 50 Mg Tabs (Atenolol) ....  Take one by mouth daily 4)  Klor-con 10 10 Meq Tbcr (Potassium chloride) .... Take one by mouth daily 5)  Pravastatin Sodium 40 Mg Tabs (Pravastatin sodium) .... Take 2 tablet by mouth at bedtime 6)  Triamterene-hctz 75-50 Mg Tabs (Triamterene-hctz) .Marland Kitchen.. 1 tablet daily 7)  Warfarin Sodium 5 Mg Tabs (Warfarin sodium) .... As directed 8)  Vitamin D 1000 Unit Tabs (Cholecalciferol) .... Take one by mouth daily  Current Allergies (reviewed today): ! PENICILLIN ! STEROIDS ! AMOXICILLIN

## 2010-10-21 NOTE — Letter (Signed)
Summary: Nadara Eaton letter  [place] at [facility]  [address] [place], [place] [address]   Phone: [phone]  Fax: [phone]       04/29/2010 MRN: [id number]  BRANDILYNN TAORMINA [address] [facility] [address] [place], [place]  [address]  Dear Ms. [facility] [place] and [place] announce the retirement of Arta Silence, M.D., from full-time practice at the [facility] office effective March 20, 2010 and his plans of returning part-time.  It is important to Dr. Hetty Ely and to our practice that you understand that [facility] has seven physicians in our office for your health care needs.  We will continue to offer the same exceptional care that you have today.    Dr. Hetty Ely has spoken to many of you about his plans for retirement and returning part-time in the fall.   We will continue to work with you through the transition to schedule appointments for you in the office and meet the high standards that [facility] is committed to.   Again, it is with great pleasure that we share the news that Dr. Hetty Ely will return to [facility] in October of 2011 with a reduced schedule.    If you have any questions, or would like to request an appointment with one of our physicians, please call us at [phone] and press the option for Scheduling an appointment.  We take pleasure in providing you with excellent patient care and look forward to seeing you at your next office visit.  Our [facility] Physicians are:  Tillman Abide, M.D. Laurita Quint, M.D. Roxy Manns, M.D. Kerby Nora, M.D. Hannah Beat, M.D. Ruthe Mannan, M.D. We proudly welcomed Raechel Ache, M.D. and Eustaquio Boyden, M.D. to the practice in July/August 2011.  Sincerely,  [facility]

## 2010-10-21 NOTE — Letter (Signed)
Summary: [facility],Order for additional diagnostic imaging  [facility],Order for additional diagnostic imaging   Imported By: Beau Fanny 12/19/2009 11:45:03  _____________________________________________________________________  External Attachment:    Type:   Image     Comment:   External Document

## 2010-10-21 NOTE — Assessment & Plan Note (Signed)
Summary: [facility] FLU SHOT/RBH   Nurse Visit   Allergies: 1)  ! Penicillin 2)  ! Steroids 3)  ! Amoxicillin  Immunizations Administered:  Influenza Vaccine # 1:    Vaccine Type: Fluvax 3+    Site: left deltoid    Mfr: [employer]    Dose: 0.5 ml    Route: IM    Given by: Mervin Hack CMA (AAMA)    Exp. Date: 03/21/2011    Lot #: [id number]    VIS given: 04/15/10 version given June 10, 2010.  Flu Vaccine Consent Questions:    Do you have a history of severe allergic reactions to this vaccine? no    Any prior history of allergic reactions to egg and/or gelatin? no    Do you have a sensitivity to the preservative Thimersol? no    Do you have a past history of Guillan-Barre Syndrome? no    Do you currently have an acute febrile illness? no    Have you ever had a severe reaction to latex? no    Vaccine information given and explained to patient? yes    Are you currently pregnant? no  Orders Added: 1)  Flu Vaccine 21yrs + [90658] 2)  Admin 1st Vaccine [[address]]

## 2010-10-21 NOTE — Progress Notes (Signed)
  Phone Note Call from Patient   Summary of Call: patient is having a left breast bx on April 18th, she was told to contact you to ask how many doses of Warafin to hold for that.  Initial call taken by: Mills Koller,  December 18, 2009 2:26 PM  Follow-up for Phone Call        Stop Coumadin for 5 days and then start the day done after procedure is completed. recheck one week later. Follow-up by: Shaune Leeks MD,  December 18, 2009 4:04 PM  Additional Follow-up for Phone Call Additional follow up Details #1::        Patient notified and appt made.  Additional Follow-up by: Mills Koller,  December 18, 2009 5:06 PM

## 2010-10-21 NOTE — Letter (Signed)
Summary: Results Follow up Letter  [place] at [facility]  [address] [place], [place] [address]   Phone: [phone]  Fax: [phone]    07/01/2010 MRN: [id number]  Erica Bridges [address] [facility] [address] [facility] [address] [place], [place]  [address]  Dear Ms. Heffern,  The following are the results of your recent test(s):  Test         Result    Pap Smear:        Normal _____  Not Normal _____ Comments: ______________________________________________________ Cholesterol: LDL(Bad cholesterol):         Your goal is less than:         HDL (Good cholesterol):       Your goal is more than: Comments:  ______________________________________________________ Mammogram:        Normal __X___  Not Normal _____ Comments: Next mammogram due 11/2010.  ___________________________________________________________________ Hemoccult:        Normal _____  Not normal _______ Comments:    _____________________________________________________________________ Other Tests:    We routinely do not discuss normal results over the telephone.  If you desire a copy of the results, or you have any questions about this information we can discuss them at your next office visit.   Sincerely,

## 2010-11-06 ENCOUNTER — Other Ambulatory Visit: Payer: Self-pay

## 2010-11-06 ENCOUNTER — Other Ambulatory Visit (INDEPENDENT_AMBULATORY_CARE_PROVIDER_SITE_OTHER): Payer: Medicare Other

## 2010-11-06 ENCOUNTER — Encounter: Payer: Self-pay | Admitting: Family Medicine

## 2010-11-06 DIAGNOSIS — Z7901 Long term (current) use of anticoagulants: Secondary | ICD-10-CM

## 2010-11-06 DIAGNOSIS — Z5181 Encounter for therapeutic drug level monitoring: Secondary | ICD-10-CM

## 2010-11-06 DIAGNOSIS — I4891 Unspecified atrial fibrillation: Secondary | ICD-10-CM

## 2010-11-06 LAB — CONVERTED CEMR LAB: INR: 2.2

## 2010-12-01 ENCOUNTER — Encounter: Payer: Self-pay | Admitting: Family Medicine

## 2010-12-01 ENCOUNTER — Other Ambulatory Visit (INDEPENDENT_AMBULATORY_CARE_PROVIDER_SITE_OTHER): Payer: Medicare Other

## 2010-12-01 ENCOUNTER — Other Ambulatory Visit: Payer: Self-pay | Admitting: Family Medicine

## 2010-12-01 DIAGNOSIS — Z5181 Encounter for therapeutic drug level monitoring: Secondary | ICD-10-CM

## 2010-12-01 DIAGNOSIS — E7800 Pure hypercholesterolemia, unspecified: Secondary | ICD-10-CM

## 2010-12-01 DIAGNOSIS — Z7901 Long term (current) use of anticoagulants: Secondary | ICD-10-CM

## 2010-12-01 DIAGNOSIS — I10 Essential (primary) hypertension: Secondary | ICD-10-CM

## 2010-12-01 DIAGNOSIS — I4891 Unspecified atrial fibrillation: Secondary | ICD-10-CM

## 2010-12-01 LAB — BASIC METABOLIC PANEL
BUN: 22 mg/dL (ref 6–23)
Creatinine, Ser: 1 mg/dL (ref 0.4–1.2)
GFR: 53.97 mL/min — ABNORMAL LOW (ref >60.00)
Glucose, Bld: 99 mg/dL (ref 70–99)
Potassium: 3.7 mEq/L (ref 3.5–5.1)

## 2010-12-01 LAB — LIPID PANEL
Cholesterol: 149 mg/dL (ref 0–200)
Triglycerides: 137 mg/dL (ref 0.0–149.0)
VLDL: 27.4 mg/dL (ref 0.0–40.0)

## 2010-12-01 LAB — HEPATIC FUNCTION PANEL
AST: 21 U/L (ref 0–37)
Albumin: 3.8 g/dL (ref 3.5–5.2)
Total Bilirubin: 0.7 mg/dL (ref 0.3–1.2)

## 2010-12-01 LAB — CBC WITH DIFFERENTIAL/PLATELET
Basophils Absolute: 0.1 10*3/uL (ref 0.0–0.1)
Basophils Relative: 0.9 % (ref 0.0–3.0)
Eosinophils Absolute: 0.4 10*3/uL (ref 0.0–0.7)
Hemoglobin: 15.2 g/dL — ABNORMAL HIGH (ref 12.0–15.0)
MCHC: 33.2 g/dL (ref 30.0–36.0)
MCV: 91.2 fl (ref 78.0–100.0)
Monocytes Absolute: 0.9 10*3/uL (ref 0.1–1.0)
Neutro Abs: 4.8 10*3/uL (ref 1.4–7.7)
Neutrophils Relative %: 62.9 % (ref 43.0–77.0)
RBC: 5.03 Mil/uL (ref 3.87–5.11)
RDW: 13.2 % (ref 11.5–14.6)

## 2010-12-03 ENCOUNTER — Encounter: Payer: Self-pay | Admitting: Family Medicine

## 2010-12-03 ENCOUNTER — Encounter (INDEPENDENT_AMBULATORY_CARE_PROVIDER_SITE_OTHER): Payer: Medicare Other | Admitting: Family Medicine

## 2010-12-03 DIAGNOSIS — Z0110 Encounter for examination of ears and hearing without abnormal findings: Secondary | ICD-10-CM

## 2010-12-03 DIAGNOSIS — E7800 Pure hypercholesterolemia, unspecified: Secondary | ICD-10-CM

## 2010-12-03 DIAGNOSIS — Z0000 Encounter for general adult medical examination without abnormal findings: Secondary | ICD-10-CM

## 2010-12-03 DIAGNOSIS — I10 Essential (primary) hypertension: Secondary | ICD-10-CM

## 2010-12-03 DIAGNOSIS — Z0100 Encounter for examination of eyes and vision without abnormal findings: Secondary | ICD-10-CM

## 2010-12-09 NOTE — Letter (Signed)
Summary: Nature conservation officer [employer] Wellness Visit Questionnaire   [employer] Annual Wellness Visit Questionnaire   Imported By: Beau Fanny 12/03/2010 10:25:06  _____________________________________________________________________  External Attachment:    Type:   Image     Comment:   External Document

## 2010-12-09 NOTE — Assessment & Plan Note (Signed)
Summary: CPX/ LFW   Vital Signs:  Patient profile:   77 year old female Weight:      198.75 pounds BMI:     30.11 Temp:     97.1 degrees F oral Pulse rate:   60 / minute Pulse rhythm:   regular BP sitting:   114 / 70  (left arm) Cuff size:   large  Vitals Entered By: Sydell Axon LPN (December 03, 2010 8:11 AM) CC: 30 minute checkup, has had a hysterectomy, Preventive Care  Vision Screening:Left eye with correction: 20 / 30 Right eye with correction: 20 / 40 Both eyes with correction: 20 / 30       25db HL: Left  500 hz: 25db 1000 hz: No Response 2000 hz: No Response 4000 hz: No Response Right  500 hz: 25db 1000 hz: No Response 2000 hz: No Response 4000 hz: No Response    History of Present Illness: Pt here for Comp Exam. She sees the folks at [facility] for followup of her endometrial cancer. Her last Pap smear was in 8/11, nml. She feels well with no complaints. She feels better than last year.  Her vision has been checked lately and was told her vision is not bad enough to change her glasses. She heard sounds that she did not respond to on oiur exam. She has been seen by Audiology in the past and was told she has some loss but not enough to get aids.  She has no probs with ADLs.  She does not exercise regularly because of aching.   Preventive Screening-Counseling & Management  Alcohol-Tobacco     Alcohol drinks/day: 0     Smoking Status: quit     Year Quit: 1988     Pack years: 15 yrs     Passive Smoke Exposure: no  Caffeine-Diet-Exercise     Caffeine use/day: 5+     Does Patient Exercise: no  Problems Prior to Update: 1)  Unspecified Menopausal&postmenopausal Disorder  (ICD-627.9) 2)  Onychia, Left Second Toenail  (ICD-681.11) 3)  Special Screening Malig Neoplasms Other Sites  (ICD-V76.49) 4)  Other Screening Mammogram  (ICD-V76.12) 5)  Dermatitis, Other Atopic  (ICD-691.8) 6)  Hx of Cancer, Endometrium  (ICD-182.0) 7)  Hx of Hypercholesterolemia   (ICD-272.0) 8)  Hx of Reactive Airway Disease  (ICD-493.90) 9)  Hypertension  (ICD-401.9) 10)  Diverticulosis, Colon  (ICD-562.10) 11)  Encounter For Therapeutic Drug Monitoring  (ICD-V58.83) 12)  Aftercare, Long-term Use, Anticoagulants  (ICD-V58.61) 13)  Atrial Fibrillation  (ICD-427.31)  Medications Prior to Update: 1)  Calcium .... Take 1000 Mg By Mouth Daily 2)  Zyrtec 10 Mg Tabs (Cetirizine Hcl) .... Take One By Mouth Daily As Needed 3)  Atenolol 50 Mg Tabs (Atenolol) .... Take One By Mouth Daily 4)  Klor-Con 10 10 Meq Tbcr (Potassium Chloride) .... Take One By Mouth Daily 5)  Pravastatin Sodium 40 Mg Tabs (Pravastatin Sodium) .... Take 2 Tablet By Mouth At Bedtime 6)  Triamterene-Hctz 75-50 Mg  Tabs (Triamterene-Hctz) .Marland Kitchen.. 1 Tablet Daily 7)  Warfarin Sodium 5 Mg  Tabs (Warfarin Sodium) .... As Directed 8)  Vitamin D 1000 Unit Tabs (Cholecalciferol) .... Take One By Mouth Daily  Current Medications (verified): 1)  Zyrtec 10 Mg Tabs (Cetirizine Hcl) .... Take One By Mouth Daily As Needed 2)  Atenolol 50 Mg Tabs (Atenolol) .... Take One By Mouth Daily 3)  Klor-Con 10 10 Meq Tbcr (Potassium Chloride) .... Take One By Mouth Daily 4)  Pravastatin Sodium 40  Mg Tabs (Pravastatin Sodium) .... Take 2 Tablet By Mouth At Bedtime 5)  Triamterene-Hctz 75-50 Mg  Tabs (Triamterene-Hctz) .Marland Kitchen.. 1 Tablet Daily 6)  Warfarin Sodium 5 Mg  Tabs (Warfarin Sodium) .... As Directed 7)  Vitamin D 1000 Unit Tabs (Cholecalciferol) .... Take One By Mouth Daily 8)  Calcium 500/vitamin D 500-125 Mg-Unit Tabs (Calcium Carbonate-Vitamin D) .... Take One By Mouth Two Times A Day  Allergies: 1)  ! Penicillin 2)  ! Steroids 3)  ! Amoxicillin  Past History:  Past Medical History: Last updated: 04/28/2008 Atrial fibrillation Diverticulosis, colon Hypertension Elevated glucose- 100 (03/2006) CT Chest No Metastasis (Dr Seward Carol) (02/09/08) CT Abdomen No Metastasis Stable hypervasc lesion in Liver  CT Pelvis No  Mets (Dr Seward Carol) (02/09/08)  Past Surgical History: Last updated: 02/25/2007 Cataract extraction Dexa- normal (12/1998) Flex- divertics (1998) Adenosine myoview- normal EF 68% (03/06/22006) Colonoscopy- polyps, biopsy neg, divertics, ext hemorrhoids (10/22/2005) Hysterectomy/ BSO- adenocarcinoma of the uterus CT Chest  Stable w/o met dz   Emphesem changes   02/02/07 CT Abd neg mets   stable R Hepatic lobe hypervascular lesion   02/02/07 CT Pelvis  NML   02/02/07  Family History: Last updated: March 08, 2007 Father: deceased age 68- HTN, stroke Mother: deceased age 50- DM,CAD,HTN Siblings: none  Social History: Last updated: 03/08/2007 Marital Status: Married Children: 3 Occupation: retired bookkeeper  Risk Factors: Alcohol Use: 0 (12/03/2010) Caffeine Use: 5+ (12/03/2010) Exercise: no (12/03/2010)  Risk Factors: Smoking Status: quit (12/03/2010) Passive Smoke Exposure: no (12/03/2010)  Review of Systems General:  Denies chills, fatigue, fever, sweats, weakness, and weight loss. Eyes:  Complains of blurring; denies discharge and eye pain; see HPI. ENT:  Complains of decreased hearing; denies ear discharge, earache, and ringing in ears; mild hearing problem with telephone.. CV:  Complains of shortness of breath with exertion; denies chest pain or discomfort, fainting, fatigue, palpitations, swelling of feet, and swelling of hands; chronicically progressed. Resp:  Complains of shortness of breath; denies cough and wheezing. GI:  Complains of bloody stools, hemorrhoids, and indigestion; denies abdominal pain, change in bowel habits, constipation, dark tarry stools, diarrhea, loss of appetite, nausea, vomiting, vomiting blood, and yellowish skin color; bright red with hemm irritation.. GU:  Denies discharge, dysuria, nocturia, and urinary frequency. MS:  Complains of joint pain, low back pain, and muscle aches; global has h/o disc probs s/p surgery 40 yrs ago. Had some permanent sciatic  probs. . Derm:  Complains of rash; denies dryness and itching. Neuro:  Denies numbness, poor balance, tingling, and tremors.  Physical Exam  General:  Well-developed,well-nourished,in no acute distress; alert,appropriate and cooperative throughout examination Head:  Normocephalic and atraumatic without obvious abnormalities. No apparent alopecia or balding. Sinuses NT. Eyes:  Conjunctiva clear bilaterally.  Ears:  External ear exam shows no significant lesions or deformities.  Otoscopic examination reveals clear canals, tympanic membranes are intact bilaterally without bulging, retraction, inflammation or discharge. Hearing is grossly normal bilaterally. Nose:  External nasal examination shows no deformity or inflammation. Nasal mucosa are pink and moist without lesions or exudates. Mouth:  Oral mucosa and oropharynx without lesions or exudates.  Teeth in good repair. Neck:  No deformities, masses, or tenderness noted. Chest Wall:  No deformities, masses, or tenderness noted. Breasts:  Not done. Sees [facility]. Lungs:  Normal respiratory effort, chest expands symmetrically. Lungs are clear to auscultation, no crackles or wheezes. Heart:  Irreg, irreg, good rate. Nml S1 and S2. Abdomen:  Bowel sounds positive,abdomen soft and non-tender without masses,  organomegaly or hernias noted. Mildly obese. Rectal:  NOT done. Genitalia:  Not Done, had appt with gyn/onc in Jan  at [facility]. Msk:  No deformity or scoliosis noted of thoracic or lumbar spine.   Pulses:  R and L carotid,radial,femoral,dorsalis pedis and posterior tibial pulses are full and equal bilaterally Extremities:  No clubbing, cyanosis, edema, or deformity noted..   Neurologic:  No cranial nerve deficits noted. Station and gait are normal. Sensory, motor and coordinative functions appear intact. Skin:  Intact without suspicious lesions or rashes. Cervical Nodes:  No lymphadenopathy noted Inguinal Nodes:  No significant  adenopathy Psych:  Cognition and judgment appear intact. Alert and cooperative with normal attention span and concentration. No apparent delusions, illusions, hallucinations   Impression & Recommendations:  Problem # 1:  HEALTH MAINTENANCE EXAM (ICD-V70.0) Assessment Comment Only  I have personally reviewed the Medicare Annual Wellness questionnaire and have noted 1.   The patient's medical and social history 2.   Their use of alcohol, tobacco or illicit drugs 3.   Their current medications and supplements 4.   The patient's functional ability including ADL's, fall risks, home safety risks and hearing or visual             impairment. Has some known wearing and vision loss but acceptable to the pt.Does not affect her abilities to do nml things. 5.   Diet and physical activities 6.   Evidence for depression or mood disorders   Orders: [facility] -Subsequent Annual Wellness Visit [phone])  Problem # 2:  ONYCHIA, LEFT SECOND TOENAIL (ICD-681.11) Assessment: Unchanged Sees Dr Al Corpus regularly. Is scheduled next week.  Problem # 3:  OTHER SCREENING MAMMOGRAM (ICD-V76.12) Assessment: Unchanged UTD via [facility]. LAst 06/30/10  Problem # 4:  DERMATITIS, OTHER ATOPIC (ICD-691.8) Assessment: Unchanged Stable uses Cortaid.  Problem # 5:  Hx of CANCER, ENDOMETRIUM (ICD-182.0) Assessment: Unchanged Followed via [facility].  Problem # 6:  Hx of HYPERCHOLESTEROLEMIA (ICD-272.0) Assessment: Unchanged Good control with excellent recent results. Her updated medication list for this problem includes:    Pravastatin Sodium 40 Mg Tabs (Pravastatin sodium) .Marland Kitchen... Take 2 tablet by mouth at bedtime  Labs Reviewed: SGOT: 21 (12/01/2010)   SGPT: 17 (12/01/2010)  Lipid Goals: Chol Goal: 200 (02/22/2007)   HDL Goal: 40 (02/22/2007)   LDL Goal: 160 (02/22/2007)   TG Goal: 150 (02/22/2007)  Prior 10 Yr Risk Heart Disease: 5 % (02/22/2007)   HDL:62.20 (12/01/2010), 73.70 (11/22/2009)  LDL:59  (12/01/2010), 43 (82/95/6213)  Chol:149 (12/01/2010), 146 (11/22/2009)  Trig:137.0 (12/01/2010), 149.0 (11/22/2009)  Problem # 7:  HYPERTENSION (ICD-401.9) Assessment: Unchanged Great control. Her updated medication list for this problem includes:    Atenolol 50 Mg Tabs (Atenolol) .Marland Kitchen... Take one by mouth daily    Triamterene-hctz 75-50 Mg Tabs (Triamterene-hctz) .Marland Kitchen... 1 tablet daily  BP today: 114/70 Prior BP: 114/70 (11/26/2009)  Prior 10 Yr Risk Heart Disease: 5 % (02/22/2007)  Labs Reviewed: K+: 3.7 (12/01/2010) Creat: : 1.0 (12/01/2010)   Chol: 149 (12/01/2010)   HDL: 62.20 (12/01/2010)   LDL: 59 (12/01/2010)   TG: 137.0 (12/01/2010)  Problem # 8:  DIVERTICULOSIS, COLON (ICD-562.10) Assessment: Unchanged Discussed being seen for LLQ pain.  Problem # 9:  ATRIAL FIBRILLATION (ICD-427.31) Assessment: Unchanged Rate well controlled on Coumadin.Marland Kitchenis therapeutic. Her updated medication list for this problem includes:    Atenolol 50 Mg Tabs (Atenolol) .Marland Kitchen... Take one by mouth daily    Warfarin Sodium 5 Mg Tabs (Warfarin sodium) .Marland Kitchen... As directed  Complete Medication List:  1)  Zyrtec 10 Mg Tabs (Cetirizine hcl) .... Take one by mouth daily as needed 2)  Atenolol 50 Mg Tabs (Atenolol) .... Take one by mouth daily 3)  Klor-con 10 10 Meq Tbcr (Potassium chloride) .... Take one by mouth daily 4)  Pravastatin Sodium 40 Mg Tabs (Pravastatin sodium) .... Take 2 tablet by mouth at bedtime 5)  Triamterene-hctz 75-50 Mg Tabs (Triamterene-hctz) .Marland Kitchen.. 1 tablet daily 6)  Warfarin Sodium 5 Mg Tabs (Warfarin sodium) .... As directed 7)  Vitamin D 1000 Unit Tabs (Cholecalciferol) .... Take one by mouth daily 8)  Calcium 500/vitamin D 500-125 Mg-unit Tabs (Calcium carbonate-vitamin d) .... Take one by mouth two times a day  PAP Screening:    Last PAP smear:  05/01/2010  PAP Smear Results:    Date of Exam:  05/01/2010    Results:  Normal, Satisfactory  PAP Smear Comments:    as reported by  pt.  Mammogram Screening:    Last Mammogram:  06/30/2010  Osteoporosis Risk Assessment:  Risk Factors for Fracture or Low Bone Density:   Race (White or Asian):     yes   Smoking status:       quit  Immunization & Chemoprophylaxis:    Tetanus vaccine: Historical  (01/20/1999)    Influenza vaccine: Fluvax 3+  (06/10/2010)    Pneumovax: Historical  (09/21/2004)  Patient Instructions: 1)  RTC as needed or in one year. 2)  Look into Zostavax. 3)  Try Co Q 10 100-200mg  per label.   Orders Added: 1)  [facility] -Subsequent Annual Wellness Visit [G0439]    Current Allergies (reviewed today): ! PENICILLIN ! STEROIDS ! AMOXICILLIN  Appended Document: CPX/ LFW     Clinical Lists Changes  Observations: Added new observation of TD BOOSTER: Td (03/06/2009 9:10)       Tetanus/Td Immunization History:    Tetanus/Td # 1:  Td (03/06/2009)

## 2010-12-12 ENCOUNTER — Other Ambulatory Visit: Payer: Self-pay | Admitting: Family Medicine

## 2010-12-25 ENCOUNTER — Telehealth: Payer: Self-pay | Admitting: *Deleted

## 2010-12-25 DIAGNOSIS — Z5181 Encounter for therapeutic drug level monitoring: Secondary | ICD-10-CM

## 2010-12-25 DIAGNOSIS — Z7901 Long term (current) use of anticoagulants: Secondary | ICD-10-CM

## 2010-12-25 DIAGNOSIS — I4891 Unspecified atrial fibrillation: Secondary | ICD-10-CM

## 2010-12-25 NOTE — Telephone Encounter (Signed)
INR monitoring enrollment  

## 2010-12-30 ENCOUNTER — Ambulatory Visit (INDEPENDENT_AMBULATORY_CARE_PROVIDER_SITE_OTHER): Payer: Medicare Other | Admitting: Family Medicine

## 2010-12-30 DIAGNOSIS — Z7901 Long term (current) use of anticoagulants: Secondary | ICD-10-CM

## 2010-12-30 DIAGNOSIS — Z5181 Encounter for therapeutic drug level monitoring: Secondary | ICD-10-CM

## 2010-12-30 DIAGNOSIS — I4891 Unspecified atrial fibrillation: Secondary | ICD-10-CM

## 2010-12-30 NOTE — Patient Instructions (Signed)
Continue current dose, check in 4 weeks  

## 2011-01-05 ENCOUNTER — Other Ambulatory Visit: Payer: Self-pay | Admitting: *Deleted

## 2011-01-05 MED ORDER — PRAVASTATIN SODIUM 40 MG PO TABS: 80.0000 mg | ORAL_TABLET | Freq: Every day | ORAL | Status: DC

## 2011-01-27 ENCOUNTER — Ambulatory Visit (INDEPENDENT_AMBULATORY_CARE_PROVIDER_SITE_OTHER): Payer: Medicare Other | Admitting: Family Medicine

## 2011-01-27 DIAGNOSIS — I4891 Unspecified atrial fibrillation: Secondary | ICD-10-CM

## 2011-01-27 DIAGNOSIS — Z7901 Long term (current) use of anticoagulants: Secondary | ICD-10-CM

## 2011-01-27 DIAGNOSIS — Z5181 Encounter for therapeutic drug level monitoring: Secondary | ICD-10-CM

## 2011-01-27 LAB — POCT INR: INR: 2.2

## 2011-02-03 NOTE — Assessment & Plan Note (Signed)
[facility]                         ELECTROPHYSIOLOGY OFFICE NOTE   JAMILETT, FERRANTE                         MRN:          [id number]  DATE:05/24/2007                            DOB:          [birth year]    [name] returns today for followup.  She is a very pleasant 77-year-  old woman with a history of atrial fibrillation (chronic) and  hypertension, who returns today for followup.  The patient in the last  year has done well.  She occasionally has dizziness when she stands up  early in the morning but otherwise has been stable.  She denies chest  pain or shortness of breath.  She has had no peripheral edema.   Medications include triamterene/HCTZ, atenolol, warfarin, potassium, and  calcium supplement.  She is also on Pravachol.   PHYSICAL EXAMINATION:  She is a pleasant, well-appearing 77 year old  woman in no distress.  The blood pressure today was 108/76.  The pulse was 65 and irregular.  Respirations were 18.  The weight was 200 pounds.  NECK:  No jugular venous distention.  LUNGS:  Clear bilaterally to auscultation.  There are no wheezes, rales  or rhonchi present.  CARDIOVASCULAR:  Regular rate and rhythm with a normal S1 and S2.  EXTREMITIES:  No edema.   EKG demonstrated atrial fibrillation.   IMPRESSION:  1. Chronic atrial fibrillation.  2. Hypertension.  3. Chronic Coumadin therapy.  4. Dizziness.   DISCUSSION:  Ms. Abeln was stable.  Her dizziness could be exacerbated  by her HCTZ, and if this persists, this would be my first recommendation  for decrease in her medical therapy.  We will plan to see her back in  the office on a p.r.n. basis.     Doylene Canning. Ladona Ridgel, MD  Electronically Signed    GWT/MedQ  DD: 05/24/2007  DT: 05/24/2007  Job #: [id number]   cc:   Arta Silence, MD

## 2011-02-06 NOTE — H&P (Signed)
NAME:  NEHEMIE, CASSERLY NO.:  [id number]   MEDICAL RECORD NO.:  [id number]          PATIENT TYPE:  AMB   LOCATION:  SDC                           FACILITY:  [facility]   PHYSICIAN:  Ginger Carne, MD  DATE OF BIRTH:  28-Jan-1927   DATE OF ADMISSION:  DATE OF DISCHARGE:                                HISTORY & PHYSICAL   ADDENDUM:  Ms. Cantrelle completed her cardiac evaluation for preoperative  clearance by Dr. Ladona Ridgel.  Review of patient's history indicates that the  patient has been under good control for her chronic atrial fibrillation  without recent breakthroughs.  She underwent an adenosine Myoview stress  test, which was normal.  Her ejection fraction was 68% as well.  In  discussion with Dr. Ladona Ridgel and in his notes, the patient was cleared for  surgery and was advised for inadvertent breakthrough intraoperatively or  postoperatively of atrial fibrillation, to use Lopressor 5 mg IV over three  to five minutes with a repeat in five to 10 minutes as necessary.  Otherwise, there is no change in condition or status of Ms. Grissinger, since her  November 16, 2004, dictation.      SHB/MEDQ  D:  12/01/2004  T:  12/01/2004  Job:  [id number]

## 2011-02-06 NOTE — Op Note (Signed)
NAMEJIMMI, SIDENER NO.:  [id number]   MEDICAL RECORD NO.:  [id number]          PATIENT TYPE:  OBV   LOCATION:  9399                          FACILITY:  [facility]   PHYSICIAN:  Ginger Carne, MD  DATE OF BIRTH:  1927-06-28   DATE OF PROCEDURE:  DATE OF DISCHARGE:                                 OPERATIVE REPORT   PREOPERATIVE DIAGNOSIS:  Adenocarcinoma of uterus.   POSTOPERATIVE DIAGNOSIS:  Adenocarcinoma of uterus.   PROCEDURE:  Laparoscopic-assisted vaginal hysterectomy, bilateral salpingo-  oophorectomy, and peritoneal washings.   SURGEON:  Ginger Carne, M.D.   ASSISTANT:  None.   COMPLICATIONS:  None immediate.   ESTIMATED BLOOD LOSS:  Less than 75 mL.   SPECIMENS:  Uterus, cervix, right and left tube and ovary with peritoneal  washings.   COMPLICATIONS:  None immediate.   OPERATIVE FINDINGS:  The patient preoperative diagnosis of well-  differentiated adenocarcinoma of uterus with a normal Pap smear.  Both  adnexa were palpable and found to be normal.  Laparoscopic evaluation was  unremarkable in the pelvis.  Uterus was normal size.  Peritoneal washing  obtained and sent for cell block and cytology.   OPERATIVE PROCEDURE:  The patient was prepped and draped in the usual  fashion and placed in a lithotomy position.  Betadine solution used for  antiseptic and the patient was catheterized prior to the procedure.  After  adequate general anesthesia, a vertical infra-umbilical was made, the Veress  needle placed in the abdomen.  Open and closing pressures were 10 and 15  mmHg.  __________ Trocar placed in the same incision.  Laparoscope placed in  the trocar sleeve.  Two 5 mm ports were made in the left lower quadrant and  left hypogastric regions respectively.  Afterwards, ureters were identified  bilaterally.  Peritoneal washing obtained from lactated Ringer's.  Afterwards, the infundibulopelvic ligaments were bipolar cauterized and cut  including the round ligaments, 40 watts of bipolar cautery was utilized, no  bleeding noted afterwards.   Attention was then directed to the vaginal portion of the procedure.  After  appropriate placement of lower extremities in a neurologically safe  position, double-tooth tenaculum grasped at the anterior and posterior lips  of the cervix, 2 cm of anterior and posterior vaginal epithelium were  incised transversely, peritoneal reflections opened without injury to  respective organs.  Uterosacral, cardinal ligament complexes were clamped,  cut, and ligated with 0 Vicryl suture.  This extended to the uterine  vasculature and its ascending branches and broad ligaments.  At this point,  the uterus, cervix, tubes, and ovaries were removed, bleeding points  hemostatically checked.  Irrigation followed, closure of the cuff with 0  Vicryl interlocking suture.   Following this, re-inspection from a laparoscopic perspective following.  Bleeding points hemostatically checked.  No active bleeding noted at the end  of the procedure.  Gas release, trocars removed.  Closure of the 10 mm  fascial site with 0 Vicryl suture and 4-0 Vicryl for subcuticular closure.  Instruments, sponge counts were correct.  The patient tolerated  the  procedure well and returned to post-anesthesia recovery room in excellent  condition.      SHB/MEDQ  D:  12/04/2004  T:  12/04/2004  Job:  [id number]

## 2011-02-06 NOTE — H&P (Signed)
NAME:  Erica Bridges, Erica Bridges NO.:  [id number]   MEDICAL RECORD NO.:  [id number]          PATIENT TYPE:  AMB   LOCATION:  SDC                           [facility]ACILITY:  [facility]   PHYSICIAN:  Ginger Carne, MD  DATE OF BIRTH:  Dec 25, 1926   DATE OF ADMISSION:  DATE OF DISCHARGE:                                HISTORY & PHYSICAL   ADMISSION DIAGNOSIS:  Well-differentiated adenocarcinoma of the cervix.   HISTORY OF PRESENT ILLNESS:  This patient is a 77 year old gravida 4, para 3-  0-1-3, Caucasian female with well-differentiated adenocarcinoma of the  uterus.  She is scheduled for a laparoscopically-assisted vaginal  hysterectomy, bilateral salpingo-oophorectomy and peritoneal washings.  The  patient presented with postmenopausal bleeding which started initially in  October 2005 for approximately three to four weeks, ceased and restarted  continuously, starting November 03, 2004.  Endometrial biopsy was performed  and transvaginal ultrasound revealed an endometrial thickness of 5.4 mm  without any added findings.  Report came back as well-differentiated  adenocarcinoma of the uterus.   OBSTETRIC/GYNECOLOGIC HISTORY:  The patient has had three vaginal deliveries  in 1954, 1958 and 1963.  In 1956 had a first trimester miscarriage.  In  1965, the patient had a dilation and curettage for abnormal uterine  bleeding.   CURRENT MEDICAL PROBLEMS:  Hypertension and medication-controlled atrial  fibrillation   MEDICATIONS:  1.  Triamterene with hydrochlorothiazide 75/50 mg daily.  2.  Atenolol 5 mg once at night.  3.  Warfarin 5 mg Monday, Wednesday and Thursday, and 2.5 mg on Sunday,      Tuesday, Friday and Saturday.   ALLERGIES:  PENICILLIN.   SMOKING HISTORY:  The patient stopped two years ago.  Does not abuse alcohol  or illicit drugs.   FAMILY HISTORY:  Negative for first degree relatives having breast, colon,  ovarian or uterine carcinoma.  Her father is deceased at age  14 with  hypertension and a stroke, and her mother is deceased at 53 from  hypertension and type 2 diabetes mellitus.   REVIEW OF SYSTEMS:  Negative.   PHYSICAL EXAMINATION:  GENERAL:  The patient is a pleasant-appearing female  in no acute distress.  VITAL SIGNS:  Blood pressure is 124/72, height 5 feet 9 inches, weight 219  pounds.  HEENT:  Grossly normal.  BREASTS:  Without masses, discharge, thickenings or tenderness.  CHEST:  Clear to percussion and auscultation.  CARDIOVASCULAR:  Without murmurs or enlargements, regular rate and rhythm.  Extremities, lymphatic, skin, neurological, musculoskeletal systems all  within normal limits.  ABDOMEN:  Soft without gross hepatosplenomegaly.  PELVIC:  External genitalia, vulva and vagina normal.  Cervix smooth without  erosions or lesions.  The uterus is normal in size, anteverted and flexed.  Both adnexa are palpable, found to be normal.  RECTAL:  Hemoccult-negative and without masses.   IMPRESSION:  Well-differentiated adenocarcinoma of the uterus.   PLAN:  Laparoscopically-assisted vaginal hysterectomy, bilateral salpingo-  oophorectomy, with peritoneal washings.  The nature of said procedure  discussed in detail with the patient.  Risks including injuries to  ureter,  bowel and bladder with possible conversion to an open procedure, hemorrhage  possibly requiring blood transfusion, infection and other unforeseen  complications are discussed and understood by the patient.  She will stop  Coumadin five days prior to surgery, and a PT will be obtained immediately  before her operation.  The nature of said procedure discussed in detail with  the patient, all questions answered. Patient is presently undergoing cardiac  clearance through a cardiologist with recommendations for management of  atrial fibrillation should this occur during her hospitalization.      SHB/MEDQ  D:  11/18/2004  T:  11/18/2004  Job:  [id number]

## 2011-02-06 NOTE — Assessment & Plan Note (Signed)
[facility]                           ELECTROPHYSIOLOGY OFFICE NOTE   Erica Bridges, Erica Bridges                         MRN:          [id number]  DATE:06/09/2006                            DOB:          07-Oct-1926    Erica Bridges returns today for follow up. She is a very pleasant 77 year old  woman with a history of chronic atrial fibrillation, chronic Coumadin  therapy and hypertension who also has a history of uterine cancer and is now  one year out from her hysterectomy, chemotherapy and radiation therapy. She  is doing well although she does complain of fatigue and being somewhat  lazy. She is not exercising regularly. She is concerned that her  medications are making her feel bad.   PHYSICAL EXAMINATION:  GENERAL:  he is a pleasant, well-appearing, 79-year-  old woman in no acute distress.  VITAL SIGNS:  Blood pressure today was 118/68, pulse 66 and irregular,  respirations 18, weight 207 pounds - up from 203 pounds back a year ago.  NECK:  Revealed no jugular venous distention.  LUNGS:  Clear bilaterally to auscultation.  CARDIOVASCULAR:  Irregular rate and rhythm with normal S1 and S2.  EXTREMITIES:  Demonstrated no edema.   EKG demonstrates atrial fibrillation with a controlled ventricular response.   IMPRESSION:  1. Chronic atrial fibrillation.  2. Chronic Coumadin therapy.  3. Hypertension.  4. History of uterine cancer.   DISCUSSION:  Overall, Erica Bridges is stable. Her medications include  triamterene/hydrochlorothiazide, atenolol, warfarin, potassium, and  Pravachol. I will plan to have the patient see [place] back in one year. She is  instructed to start an exercise program of walking daily, and I have  discussed the importance of this with her. We will plan to see her back in  the office in one year.                                   Doylene Canning. Ladona Ridgel, MD   GWT/MedQ  DD:  06/09/2006  DT:  06/10/2006  Job #:  [id number]

## 2011-02-06 NOTE — Discharge Summary (Signed)
NAMEQUINESHA, Erica Bridges NO.:  [id number]   MEDICAL RECORD NO.:  [id number]          PATIENT TYPE:  INP   LOCATION:  9319                          FACILITY:  [facility]   PHYSICIAN:  Ginger Carne, MD  DATE OF BIRTH:  01-30-1927   DATE OF ADMISSION:  12/04/2004  DATE OF DISCHARGE:  12/06/2004                                 DISCHARGE SUMMARY   ADMITTING DIAGNOSIS:  Well-differentiated adenocarcinoma of the uterus.   PROCEDURES:  Laparoscopic-assisted vaginal hysterectomy, bilateral salpingo-  oophorectomy and peritoneal washings.   HOSPITAL COURSE:  This patient is a 77 year old Caucasian female who  underwent the aforementioned procedure on December 04, 2004. Postoperative  course was uneventful. She was afebrile. Her hemoglobin was 13.6  postoperatively. The patient has chronic atrial fibrillation but there was  no change in her heart rate pattern throughout the course of her stay. Her  calves were without tenderness. Lungs were clear and the patient  demonstrated no cardiac symptomatology.   She was discharged with recommendations to resume her Coumadin protocol  preoperatively, Darvocet N 100 one to two every 4-6 hours and ibuprofen 600  milligrams up to four times a day as needed for pain relief. She was also  asked to contact the office if she has a temperature elevation above 100.4  degrees Fahrenheit, increasing pelvic or flank pain, increasing vaginal  drainage or discharge or any other concerns. She will return to the office  in 3 weeks. All questions answered to the satisfaction of said patient.      SHB/MEDQ  D:  12/08/2004  T:  12/08/2004  Job:  [id number]

## 2011-02-24 ENCOUNTER — Ambulatory Visit: Payer: Medicare Other

## 2011-03-04 ENCOUNTER — Ambulatory Visit (INDEPENDENT_AMBULATORY_CARE_PROVIDER_SITE_OTHER): Payer: Medicare Other | Admitting: Family Medicine

## 2011-03-04 DIAGNOSIS — I4891 Unspecified atrial fibrillation: Secondary | ICD-10-CM

## 2011-03-04 DIAGNOSIS — Z7901 Long term (current) use of anticoagulants: Secondary | ICD-10-CM

## 2011-03-04 DIAGNOSIS — Z5181 Encounter for therapeutic drug level monitoring: Secondary | ICD-10-CM

## 2011-03-04 NOTE — Patient Instructions (Signed)
Continue current dose, check in 4 weeks  

## 2011-04-01 ENCOUNTER — Ambulatory Visit (INDEPENDENT_AMBULATORY_CARE_PROVIDER_SITE_OTHER): Payer: Medicare Other | Admitting: Family Medicine

## 2011-04-01 DIAGNOSIS — I4891 Unspecified atrial fibrillation: Secondary | ICD-10-CM

## 2011-04-01 DIAGNOSIS — Z7901 Long term (current) use of anticoagulants: Secondary | ICD-10-CM

## 2011-04-01 DIAGNOSIS — Z5181 Encounter for therapeutic drug level monitoring: Secondary | ICD-10-CM

## 2011-04-01 NOTE — Patient Instructions (Signed)
Continue 2.5 mg daily, 5 mg MWF, recheck 4 weeks

## 2011-04-18 ENCOUNTER — Ambulatory Visit (HOSPITAL_COMMUNITY)
Admission: RE | Admit: 2011-04-18 | Discharge: 2011-04-18 | Disposition: A | Discharge status: Home / Self Care | Payer: Medicare Other | Source: Ambulatory Visit | Attending: Obstetrics and Gynecology | Admitting: Obstetrics and Gynecology

## 2011-04-18 ENCOUNTER — Other Ambulatory Visit (HOSPITAL_COMMUNITY): Payer: Self-pay | Admitting: Obstetrics and Gynecology

## 2011-04-18 DIAGNOSIS — C541 Malignant neoplasm of endometrium: Secondary | ICD-10-CM

## 2011-04-18 DIAGNOSIS — C549 Malignant neoplasm of corpus uteri, unspecified: Secondary | ICD-10-CM | POA: Insufficient documentation

## 2011-04-29 ENCOUNTER — Ambulatory Visit (INDEPENDENT_AMBULATORY_CARE_PROVIDER_SITE_OTHER): Payer: Medicare Other | Admitting: Family Medicine

## 2011-04-29 DIAGNOSIS — I4891 Unspecified atrial fibrillation: Secondary | ICD-10-CM

## 2011-04-29 DIAGNOSIS — Z5181 Encounter for therapeutic drug level monitoring: Secondary | ICD-10-CM

## 2011-04-29 DIAGNOSIS — Z7901 Long term (current) use of anticoagulants: Secondary | ICD-10-CM

## 2011-04-29 NOTE — Patient Instructions (Signed)
Continue current dose, check in 4 weeks  

## 2011-05-14 ENCOUNTER — Encounter: Payer: Self-pay | Admitting: Family Medicine

## 2011-05-27 ENCOUNTER — Ambulatory Visit (INDEPENDENT_AMBULATORY_CARE_PROVIDER_SITE_OTHER): Payer: Medicare Other | Admitting: Family Medicine

## 2011-05-27 DIAGNOSIS — I4891 Unspecified atrial fibrillation: Secondary | ICD-10-CM

## 2011-05-27 DIAGNOSIS — Z5181 Encounter for therapeutic drug level monitoring: Secondary | ICD-10-CM

## 2011-05-27 DIAGNOSIS — Z7901 Long term (current) use of anticoagulants: Secondary | ICD-10-CM

## 2011-05-27 NOTE — Patient Instructions (Signed)
Continue current dose, check in 4 weeks  

## 2011-06-17 LAB — BUN: BUN: 17

## 2011-06-17 LAB — CREATININE, SERUM
Creatinine, Ser: 0.95
GFR calc Af Amer: >60

## 2011-06-23 ENCOUNTER — Ambulatory Visit (INDEPENDENT_AMBULATORY_CARE_PROVIDER_SITE_OTHER): Payer: Medicare Other | Admitting: Family Medicine

## 2011-06-23 DIAGNOSIS — I4891 Unspecified atrial fibrillation: Secondary | ICD-10-CM

## 2011-06-23 DIAGNOSIS — Z7901 Long term (current) use of anticoagulants: Secondary | ICD-10-CM

## 2011-06-23 DIAGNOSIS — Z5181 Encounter for therapeutic drug level monitoring: Secondary | ICD-10-CM

## 2011-06-23 NOTE — Patient Instructions (Signed)
Continue 2.5 mg daily, 5 mg MWF, recheck 4 weeks 

## 2011-07-21 ENCOUNTER — Ambulatory Visit (INDEPENDENT_AMBULATORY_CARE_PROVIDER_SITE_OTHER): Payer: Medicare Other | Admitting: Family Medicine

## 2011-07-21 DIAGNOSIS — Z5181 Encounter for therapeutic drug level monitoring: Secondary | ICD-10-CM

## 2011-07-21 DIAGNOSIS — I4891 Unspecified atrial fibrillation: Secondary | ICD-10-CM

## 2011-07-21 DIAGNOSIS — Z7901 Long term (current) use of anticoagulants: Secondary | ICD-10-CM

## 2011-07-21 NOTE — Patient Instructions (Signed)
Continue 2.5 mg daily, 5 mg MWF, recheck 4 weeks 

## 2011-08-18 ENCOUNTER — Ambulatory Visit (INDEPENDENT_AMBULATORY_CARE_PROVIDER_SITE_OTHER): Payer: Medicare Other | Admitting: Family Medicine

## 2011-08-18 DIAGNOSIS — Z7901 Long term (current) use of anticoagulants: Secondary | ICD-10-CM

## 2011-08-18 DIAGNOSIS — Z5181 Encounter for therapeutic drug level monitoring: Secondary | ICD-10-CM

## 2011-08-18 DIAGNOSIS — I4891 Unspecified atrial fibrillation: Secondary | ICD-10-CM

## 2011-08-18 LAB — POCT INR: INR: 2

## 2011-08-18 NOTE — Patient Instructions (Signed)
Continue current dose, check in 4 weeks  

## 2011-09-17 ENCOUNTER — Ambulatory Visit (INDEPENDENT_AMBULATORY_CARE_PROVIDER_SITE_OTHER): Payer: Medicare Other | Admitting: Family Medicine

## 2011-09-17 DIAGNOSIS — Z5181 Encounter for therapeutic drug level monitoring: Secondary | ICD-10-CM

## 2011-09-17 DIAGNOSIS — Z7901 Long term (current) use of anticoagulants: Secondary | ICD-10-CM

## 2011-09-17 DIAGNOSIS — I4891 Unspecified atrial fibrillation: Secondary | ICD-10-CM

## 2011-09-17 NOTE — Patient Instructions (Signed)
Continue current dose, check in 4 weeks  

## 2011-09-22 DIAGNOSIS — I517 Cardiomegaly: Secondary | ICD-10-CM

## 2011-09-22 HISTORY — DX: Cardiomegaly: I51.7

## 2011-09-24 ENCOUNTER — Other Ambulatory Visit: Payer: Self-pay | Admitting: *Deleted

## 2011-09-24 MED ORDER — WARFARIN SODIUM 5 MG PO TABS: ORAL_TABLET | ORAL | Status: DC

## 2011-09-24 NOTE — Telephone Encounter (Signed)
Received faxed refill request from pharmacy Refill sent electronically to pharmacy. 

## 2011-10-15 ENCOUNTER — Ambulatory Visit (INDEPENDENT_AMBULATORY_CARE_PROVIDER_SITE_OTHER): Payer: Medicare Other | Admitting: Family Medicine

## 2011-10-15 DIAGNOSIS — Z5181 Encounter for therapeutic drug level monitoring: Secondary | ICD-10-CM | POA: Diagnosis not present

## 2011-10-15 DIAGNOSIS — Z7901 Long term (current) use of anticoagulants: Secondary | ICD-10-CM

## 2011-10-15 DIAGNOSIS — I4891 Unspecified atrial fibrillation: Secondary | ICD-10-CM

## 2011-10-15 NOTE — Patient Instructions (Signed)
Continue current dose, check in 4 weeks  

## 2011-11-04 ENCOUNTER — Other Ambulatory Visit: Payer: Self-pay | Admitting: Family Medicine

## 2011-11-04 DIAGNOSIS — I4891 Unspecified atrial fibrillation: Secondary | ICD-10-CM

## 2011-11-04 DIAGNOSIS — E7800 Pure hypercholesterolemia, unspecified: Secondary | ICD-10-CM

## 2011-11-06 ENCOUNTER — Ambulatory Visit: Payer: Medicare Other

## 2011-11-09 ENCOUNTER — Other Ambulatory Visit: Payer: Medicare Other

## 2011-11-09 ENCOUNTER — Encounter: Payer: Medicare Other | Admitting: Family Medicine

## 2011-11-12 ENCOUNTER — Ambulatory Visit (INDEPENDENT_AMBULATORY_CARE_PROVIDER_SITE_OTHER): Payer: Medicare Other | Admitting: Family Medicine

## 2011-11-12 ENCOUNTER — Ambulatory Visit: Payer: Medicare Other

## 2011-11-12 DIAGNOSIS — I4891 Unspecified atrial fibrillation: Secondary | ICD-10-CM

## 2011-11-12 DIAGNOSIS — Z5181 Encounter for therapeutic drug level monitoring: Secondary | ICD-10-CM

## 2011-11-12 DIAGNOSIS — Z7901 Long term (current) use of anticoagulants: Secondary | ICD-10-CM | POA: Diagnosis not present

## 2011-11-12 LAB — POCT INR: INR: 1.8

## 2011-11-12 NOTE — Patient Instructions (Signed)
Continue 2.5 mg daily, 5 mg MWF, recheck 2 weeks

## 2011-11-16 DIAGNOSIS — H43819 Vitreous degeneration, unspecified eye: Secondary | ICD-10-CM | POA: Diagnosis not present

## 2011-11-16 DIAGNOSIS — H3530 Unspecified macular degeneration: Secondary | ICD-10-CM | POA: Diagnosis not present

## 2011-11-16 DIAGNOSIS — H43399 Other vitreous opacities, unspecified eye: Secondary | ICD-10-CM | POA: Diagnosis not present

## 2011-11-20 DIAGNOSIS — D751 Secondary polycythemia: Secondary | ICD-10-CM

## 2011-11-20 HISTORY — DX: Secondary polycythemia: D75.1

## 2011-12-03 ENCOUNTER — Other Ambulatory Visit (INDEPENDENT_AMBULATORY_CARE_PROVIDER_SITE_OTHER): Payer: Medicare Other

## 2011-12-03 DIAGNOSIS — E7800 Pure hypercholesterolemia, unspecified: Secondary | ICD-10-CM | POA: Diagnosis not present

## 2011-12-03 DIAGNOSIS — I4891 Unspecified atrial fibrillation: Secondary | ICD-10-CM

## 2011-12-03 LAB — CBC WITH DIFFERENTIAL/PLATELET
Basophils Relative: 0.7 % (ref 0.0–3.0)
Eosinophils Relative: 4.4 % (ref 0.0–5.0)
HCT: 49.8 % — ABNORMAL HIGH (ref 36.0–46.0)
Hemoglobin: 16 g/dL — ABNORMAL HIGH (ref 12.0–15.0)
Lymphs Abs: 1.7 10*3/uL (ref 0.7–4.0)
Monocytes Relative: 10.6 % (ref 3.0–12.0)
Platelets: 195 10*3/uL (ref 150.0–400.0)
RBC: 5.49 Mil/uL — ABNORMAL HIGH (ref 3.87–5.11)
WBC: 8.9 10*3/uL (ref 4.5–10.5)

## 2011-12-03 LAB — LIPID PANEL
Cholesterol: 163 mg/dL (ref 0–200)
VLDL: 26.8 mg/dL (ref 0.0–40.0)

## 2011-12-03 LAB — COMPREHENSIVE METABOLIC PANEL
Albumin: 4.2 g/dL (ref 3.5–5.2)
CO2: 33 mEq/L — ABNORMAL HIGH (ref 19–32)
GFR: 56.33 mL/min — ABNORMAL LOW (ref >60.00)
Glucose, Bld: 91 mg/dL (ref 70–99)
Sodium: 139 mEq/L (ref 135–145)
Total Bilirubin: 0.5 mg/dL (ref 0.3–1.2)
Total Protein: 7.5 g/dL (ref 6.0–8.3)

## 2011-12-04 ENCOUNTER — Ambulatory Visit (INDEPENDENT_AMBULATORY_CARE_PROVIDER_SITE_OTHER): Payer: Medicare Other | Admitting: Family Medicine

## 2011-12-04 ENCOUNTER — Encounter: Payer: Self-pay | Admitting: Family Medicine

## 2011-12-04 ENCOUNTER — Encounter: Payer: Self-pay | Admitting: Internal Medicine

## 2011-12-04 VITALS — BP 128/78 | HR 80 | Temp 98.2°F | Ht 69.0 in | Wt 186.2 lb

## 2011-12-04 DIAGNOSIS — I10 Essential (primary) hypertension: Secondary | ICD-10-CM

## 2011-12-04 DIAGNOSIS — D45 Polycythemia vera: Secondary | ICD-10-CM

## 2011-12-04 DIAGNOSIS — I4891 Unspecified atrial fibrillation: Secondary | ICD-10-CM

## 2011-12-04 DIAGNOSIS — Z86010 Personal history of colonic polyps: Secondary | ICD-10-CM | POA: Diagnosis not present

## 2011-12-04 DIAGNOSIS — Z5181 Encounter for therapeutic drug level monitoring: Secondary | ICD-10-CM | POA: Diagnosis not present

## 2011-12-04 DIAGNOSIS — C549 Malignant neoplasm of corpus uteri, unspecified: Secondary | ICD-10-CM

## 2011-12-04 DIAGNOSIS — Z0000 Encounter for general adult medical examination without abnormal findings: Secondary | ICD-10-CM

## 2011-12-04 DIAGNOSIS — Z7901 Long term (current) use of anticoagulants: Secondary | ICD-10-CM

## 2011-12-04 DIAGNOSIS — D751 Secondary polycythemia: Secondary | ICD-10-CM

## 2011-12-04 DIAGNOSIS — E7800 Pure hypercholesterolemia, unspecified: Secondary | ICD-10-CM

## 2011-12-04 DIAGNOSIS — K5730 Diverticulosis of large intestine without perforation or abscess without bleeding: Secondary | ICD-10-CM

## 2011-12-04 LAB — POCT URINALYSIS DIPSTICK
Leukocytes, UA: NEGATIVE
Nitrite, UA: NEGATIVE
Protein, UA: NEGATIVE
Urobilinogen, UA: 0.2

## 2011-12-04 LAB — POCT INR: INR: 2.3

## 2011-12-04 NOTE — Patient Instructions (Addendum)
Let us know if you want shingles shot.  If you want to receive here, call for nurse visit.  If at pharmacy, call us and we will send prescription to pharmacy. Schedule appointment for recheck hearing. Blood work today to recheck blood count, as well as urinalysis. Pass by Marion's office to schedule repeat colonoscopy. Good to see you today, call us with questions.

## 2011-12-04 NOTE — Patient Instructions (Signed)
Continue 2.5 mg daily, 5 mg MWF, recheck 4 weeks 

## 2011-12-04 NOTE — Progress Notes (Signed)
Subjective:    Patient ID: Erica Bridges, female    DOB: 1927-03-24, 78 y.o.   MRN: [id number]  HPI CC: medicare wellness  Pleasant 78 yo with h/o afib on coumadin, HTN, h/o endometrial cancer presents for medicare wellness visit.  Takes care of husband who suffers from dementia and diabetes.  Dizziness noted recently, worse when waking up in morning.  No LOC.  No palpitations, heart racing.  No HA, CP/tightness, SOB.  Has noted does better when goes slower.  Had vision screen 2-3 wks ago.  New glasses. Hearing impairment - notes change in hearing.  Planning on returning to see ENT in near future to re evaluate.  Has not used hearing aides in past.  Polycythemia - mild for years, this year Hgb 16.  Has not seen heme in past for this.  H/o uterine cancer followed by [facility] (oncologist Dr. Seward Carol), planning to see back in 04/2012.  Changed diet, losing weight, trying. Wt Readings from Last 3 Encounters:  12/04/11 186 lb 4 oz (84.482 kg)  12/03/10 198 lb 12 oz (90.152 kg)  11/26/09 201 lb 4 oz (91.286 kg)   Preventative: Colonoscopy 2007, polyps, rec rpt 5 yrs. H/o uterine cancer s/p hysterectomy 2005 Mammogram 04/2011, rpt due 04/2012.  Pt sets this up herself. [facility] at [facility]. Flu shot - 2012 Has never had shingles shot - will look into insurance. Td 2010 Pneumonia shot 2006 No falls in last year. Denies significant depression, no anhedonia, enjoys hobbies.  Endorses some low mood intermittently, but declines intervention currently.  Medications and allergies reviewed and updated in chart.  Past histories reviewed and updated if relevant as below. Patient Active Problem List  Diagnoses  . CANCER, ENDOMETRIUM  . HYPERCHOLESTEROLEMIA  . HYPERTENSION  . ATRIAL FIBRILLATION  . REACTIVE AIRWAY DISEASE  . DIVERTICULOSIS, COLON  . UNSPECIFIED MENOPAUSAL&POSTMENOPAUSAL DISORDER  . DERMATITIS, OTHER ATOPIC   Past Medical History  Diagnosis Date  . Atrial  fibrillation   . Malignant neoplasm of corpus uteri, except isthmus     followed by Roma K[facility]er with yearly visits  . Diverticulosis of colon (without mention of hemorrhage)   . HLD (hyperlipidemia)   . HTN (hypertension)   . Onychia and paronychia of toe   . Asthma    Past Surgical History  Procedure Date  . Cataract extraction   . Total abdominal hysterectomy w/ bilateral salpingoophorectomy     uterine adenocarcinoma  . Colonoscopy 10/22/05    polyps-bx negative; divertics, ext hemmorhoids   History  Substance Use Topics  . Smoking status: Former Smoker    Quit date: 09/21/1982  . Smokeless tobacco: Not on file  . Alcohol Use: No   Family History  Problem Relation Age of Onset  . Hypertension Father   . Stroke Father   . Diabetes Mother   . Coronary artery disease Mother   . Hypertension Mother    Allergies  Allergen Reactions  . Amoxicillin     REACTION: rashj  . Penicillins     REACTION: rash   Current Outpatient Prescriptions on File Prior to Visit  Medication Sig Dispense Refill  . atenolol (TENORMIN) 50 MG tablet TAKE 1 TABLET BY MOUTH EVERY DAY  90 tablet  3  . potassium chloride (K-DUR) 10 MEQ tablet TAKE 1 TABLET BY MOUTH EVERY DAY  90 tablet  3  . pravastatin (PRAVACHOL) 40 MG tablet Take 2 tablets (80 mg total) by mouth daily.  180 tablet  3  . triamterene-hydrochlorothiazide (  MAXZIDE) 75-50 MG per tablet TAKE 1 TABLET BY MOUTH ONCE A DAY  90 tablet  3  . warfarin (COUMADIN) 5 MG tablet Take as directed  100 tablet  0   Review of Systems  Constitutional: Negative for fever, chills, activity change, appetite change, fatigue and unexpected weight change.  HENT: Negative for hearing loss and neck pain.   Eyes: Negative for visual disturbance.  Respiratory: Negative for cough, chest tightness, shortness of breath and wheezing.   Cardiovascular: Negative for chest pain, palpitations and leg swelling.  Gastrointestinal: Negative for nausea, vomiting, abdominal  pain, diarrhea, constipation, blood in stool and abdominal distention.  Genitourinary: Negative for hematuria and difficulty urinating.  Musculoskeletal: Negative for myalgias and arthralgias.  Skin: Negative for rash.  Neurological: Negative for dizziness, seizures, syncope and headaches.  Hematological: Bruises/bleeds easily (on warfarin).  Psychiatric/Behavioral: Negative for dysphoric mood. The patient is not nervous/anxious.        Objective:   Physical Exam  Nursing note and vitals reviewed. Constitutional: She is oriented to person, place, and time. She appears well-developed and well-nourished. No distress.  HENT:  Head: Normocephalic and atraumatic.  Right Ear: External ear normal.  Left Ear: External ear normal.  Nose: Nose normal.  Mouth/Throat: Oropharynx is clear and moist. No oropharyngeal exudate.  Eyes: Conjunctivae and EOM are normal. Pupils are equal, round, and reactive to light. No scleral icterus.  Neck: Normal range of motion. Neck supple. No thyromegaly present.  Cardiovascular: Normal rate, normal heart sounds and intact distal pulses.  An irregularly irregular rhythm present.  No murmur heard. Pulses:      Radial pulses are 2+ on the right side, and 2+ on the left side.  Pulmonary/Chest: Effort normal and breath sounds normal. No respiratory distress. She has no wheezes. She has no rales.       CBE - deferred (done at [facility])  Abdominal: Soft. Bowel sounds are normal. She exhibits no distension and no mass. There is no tenderness. There is no rebound and no guarding.  Genitourinary:       Pelvic: deferred, done at [facility]  Musculoskeletal: Normal range of motion.  Lymphadenopathy:    She has no cervical adenopathy.  Neurological: She is alert and oriented to person, place, and time.       CN grossly intact, station and gait intact  Skin: Skin is warm and dry. No rash noted.  Psychiatric: She has a normal mood and affect. Her behavior is normal.  Judgment and thought content normal.      Assessment & Plan:

## 2011-12-05 ENCOUNTER — Encounter: Payer: Self-pay | Admitting: Family Medicine

## 2011-12-05 DIAGNOSIS — Z0000 Encounter for general adult medical examination without abnormal findings: Secondary | ICD-10-CM | POA: Insufficient documentation

## 2011-12-05 DIAGNOSIS — Z86010 Personal history of colonic polyps: Secondary | ICD-10-CM | POA: Insufficient documentation

## 2011-12-05 DIAGNOSIS — D751 Secondary polycythemia: Secondary | ICD-10-CM | POA: Insufficient documentation

## 2011-12-05 NOTE — Assessment & Plan Note (Signed)
Knows to return for LLQ pain.

## 2011-12-05 NOTE — Assessment & Plan Note (Signed)
I have personally reviewed the Medicare Annual Wellness questionnaire and have noted 1. The patient's medical and social history 2. Their use of alcohol, tobacco or illicit drugs 3. Their current medications and supplements 4. The patient's functional ability including ADL's, fall risks, home safety risks and hearing or visual impairment. 5. Diet and physical activities 6. Evidence for depression or mood disorders The patients weight, height, BMI have been recorded in the chart.  Hearing and vision has been addressed. I have made referrals, counseling and provided education to the patient based review of the above and I have provided the pt with a written personalized care plan for preventive services. See scanned form.  Declines further intervention for depression. Failed hearing- will set up formal audiological evaluation. Recent vision exam so deferred today. Set up with repeat colonscopy.  May d/c after this time, consider iFOB.

## 2011-12-05 NOTE — Assessment & Plan Note (Signed)
Irregular today but rate controlled on atenolol Continues coumadin.

## 2011-12-05 NOTE — Assessment & Plan Note (Signed)
Followed by [facility]. Sees once a year for [facility] woman exams.

## 2011-12-05 NOTE — Assessment & Plan Note (Signed)
Great control today - continue meds (atenolol and maxzide). BP Readings from Last 3 Encounters:  12/04/11 128/78  12/03/10 114/70  11/26/09 114/70

## 2011-12-05 NOTE — Assessment & Plan Note (Signed)
Great control on pravastatin 40mg  qhs.  Continue this.

## 2011-12-05 NOTE — Assessment & Plan Note (Signed)
Longstanding hx polycythemia, in past mild.  This year Hgb elevated to 16. Remote smoker.  No smoke exposure. No blood in urine - UA normal today. Check epo.  If stable, continue to monitor.

## 2011-12-05 NOTE — Assessment & Plan Note (Addendum)
Pt states told rpt 5 yrs in 2007. Refer back to GI as due for this.  Consider d/c after this year's exam. On coumadin for chronic persistent afib.

## 2011-12-07 LAB — PATHOLOGIST SMEAR REVIEW

## 2011-12-08 DIAGNOSIS — B351 Tinea unguium: Secondary | ICD-10-CM | POA: Diagnosis not present

## 2011-12-08 DIAGNOSIS — M79609 Pain in unspecified limb: Secondary | ICD-10-CM | POA: Diagnosis not present

## 2011-12-09 ENCOUNTER — Encounter: Payer: Self-pay | Admitting: Family Medicine

## 2011-12-16 DIAGNOSIS — H6120 Impacted cerumen, unspecified ear: Secondary | ICD-10-CM | POA: Diagnosis not present

## 2011-12-21 ENCOUNTER — Other Ambulatory Visit: Payer: Self-pay | Admitting: Family Medicine

## 2011-12-21 NOTE — Telephone Encounter (Signed)
Ok to refill 

## 2011-12-21 NOTE — Telephone Encounter (Signed)
Received refill request electronically from pharmacy. See warning on Potassium. Is it okay to refill medication?

## 2011-12-24 ENCOUNTER — Other Ambulatory Visit: Payer: Self-pay | Admitting: Family Medicine

## 2012-01-01 ENCOUNTER — Encounter: Payer: Self-pay | Admitting: *Deleted

## 2012-01-01 ENCOUNTER — Ambulatory Visit (INDEPENDENT_AMBULATORY_CARE_PROVIDER_SITE_OTHER): Payer: Medicare Other | Admitting: Family Medicine

## 2012-01-01 DIAGNOSIS — Z7901 Long term (current) use of anticoagulants: Secondary | ICD-10-CM

## 2012-01-01 DIAGNOSIS — I4891 Unspecified atrial fibrillation: Secondary | ICD-10-CM

## 2012-01-01 NOTE — Patient Instructions (Signed)
Continue current dose, check in 4 weeks  

## 2012-01-05 ENCOUNTER — Encounter: Payer: Self-pay | Admitting: Internal Medicine

## 2012-01-05 ENCOUNTER — Other Ambulatory Visit: Payer: Self-pay | Admitting: *Deleted

## 2012-01-05 ENCOUNTER — Ambulatory Visit (INDEPENDENT_AMBULATORY_CARE_PROVIDER_SITE_OTHER): Payer: Medicare Other | Admitting: Internal Medicine

## 2012-01-05 VITALS — BP 110/70 | HR 66 | Ht 70.0 in | Wt 185.0 lb

## 2012-01-05 DIAGNOSIS — Z86010 Personal history of colonic polyps: Secondary | ICD-10-CM

## 2012-01-05 DIAGNOSIS — I4891 Unspecified atrial fibrillation: Secondary | ICD-10-CM

## 2012-01-05 MED ORDER — WARFARIN SODIUM 5 MG PO TABS: ORAL_TABLET | ORAL | Status: DC

## 2012-01-05 NOTE — Patient Instructions (Signed)
Follow-up as needed with Dr. Leone Payor.

## 2012-01-05 NOTE — Progress Notes (Signed)
  Subjective:    Patient ID: Erica Bridges, female    DOB: [birth year], 78 y.o.   MRN: [id number]  HPI This is a very pleasant 78 year old white woman here to discuss possible repeat colonoscopy for screening and surveillance of polyps. She had 2 polyps removed in February 2007, 8 mm and 7 mm from the splenic flexure and sigmoid colon respectively. The splenic flexure polyp was adenomatous the sigmoid polyp was hyperplastic. She has no active GI symptoms at this time suspect to the lower GI tract. No bleeding, change in bowels for significant constipation or diarrhea reported.  Medications, allergies, past medical history, past surgical history, family history and social history are reviewed and updated in the EMR.  Review of Systems She remained active. She does have some allergies, arthritis and back pain, hearing difficulty and reports some nosebleed history. All other view systems negative.    Objective:   Physical Exam General:  NAD Eyes:   anicteric Lungs:  clear Heart:  S1S2 irregular rhythm no rubs, murmurs or gallops Abdomen:  soft and nontender, BS+ Psych:  Appropriate mood and affect     Data Reviewed:   Previous colonoscopy and pathology reports.       Assessment & Plan:   1. Personal history of colonic polyps   2. Atrial fibrillation on warfarin    We discussed the pros and cons of continuing surveillance and screening for colorectal cancer. There is no real good way to quantify things. She may be at some increased risk of colorectal cancer over time given her history of polyps and radiation for uterine cancer. She is not inclined to proceed with colonoscopy at her age after hearing the risks and benefits. She understands that she may develop colorectal cancer at some point and that it could cause her death.  Routine screening for colorectal cancer will be stopped at this time.  WU:[id number] Sharen Hones, MD

## 2012-01-06 ENCOUNTER — Encounter: Payer: Self-pay | Admitting: Internal Medicine

## 2012-01-07 ENCOUNTER — Other Ambulatory Visit: Payer: Self-pay | Admitting: *Deleted

## 2012-01-07 MED ORDER — PRAVASTATIN SODIUM 40 MG PO TABS: 80.0000 mg | ORAL_TABLET | Freq: Every day | ORAL | Status: DC

## 2012-01-29 ENCOUNTER — Ambulatory Visit (INDEPENDENT_AMBULATORY_CARE_PROVIDER_SITE_OTHER): Payer: Medicare Other | Admitting: Family Medicine

## 2012-01-29 DIAGNOSIS — I4891 Unspecified atrial fibrillation: Secondary | ICD-10-CM | POA: Diagnosis not present

## 2012-01-29 NOTE — Patient Instructions (Signed)
Continue 2.5 mg daily, 5 mg MWF, recheck 4 weeks 

## 2012-02-26 ENCOUNTER — Ambulatory Visit (INDEPENDENT_AMBULATORY_CARE_PROVIDER_SITE_OTHER): Payer: Medicare Other | Admitting: Internal Medicine

## 2012-02-26 DIAGNOSIS — Z5181 Encounter for therapeutic drug level monitoring: Secondary | ICD-10-CM | POA: Diagnosis not present

## 2012-02-26 DIAGNOSIS — Z7901 Long term (current) use of anticoagulants: Secondary | ICD-10-CM

## 2012-02-26 DIAGNOSIS — I4891 Unspecified atrial fibrillation: Secondary | ICD-10-CM | POA: Diagnosis not present

## 2012-02-26 LAB — POCT INR: INR: 2

## 2012-02-26 NOTE — Patient Instructions (Signed)
Continue current dose, check in 4 weeks  

## 2012-03-15 DIAGNOSIS — M79609 Pain in unspecified limb: Secondary | ICD-10-CM | POA: Diagnosis not present

## 2012-03-15 DIAGNOSIS — B351 Tinea unguium: Secondary | ICD-10-CM | POA: Diagnosis not present

## 2012-03-25 ENCOUNTER — Other Ambulatory Visit (INDEPENDENT_AMBULATORY_CARE_PROVIDER_SITE_OTHER): Payer: Medicare Other

## 2012-03-25 DIAGNOSIS — I4891 Unspecified atrial fibrillation: Secondary | ICD-10-CM

## 2012-03-25 LAB — PROTIME-INR: Prothrombin Time: 24.8 s — ABNORMAL HIGH (ref 10.2–12.4)

## 2012-03-29 ENCOUNTER — Telehealth: Payer: Self-pay

## 2012-03-29 NOTE — Telephone Encounter (Signed)
Pt left v/m requesting PT and any dose change as well as next appt. Camelia Eng will call pt.

## 2012-03-29 NOTE — Telephone Encounter (Signed)
Results given to the patient, no changes in dosing. Scheduled for 1 month, 8.9.13 @ 11:30.

## 2012-03-31 ENCOUNTER — Encounter: Payer: Medicare Other | Admitting: Family Medicine

## 2012-04-29 ENCOUNTER — Ambulatory Visit (INDEPENDENT_AMBULATORY_CARE_PROVIDER_SITE_OTHER): Payer: Medicare Other | Admitting: Internal Medicine

## 2012-04-29 DIAGNOSIS — Z7901 Long term (current) use of anticoagulants: Secondary | ICD-10-CM | POA: Diagnosis not present

## 2012-04-29 DIAGNOSIS — I4891 Unspecified atrial fibrillation: Secondary | ICD-10-CM | POA: Diagnosis not present

## 2012-04-29 DIAGNOSIS — Z5181 Encounter for therapeutic drug level monitoring: Secondary | ICD-10-CM

## 2012-04-29 NOTE — Patient Instructions (Signed)
Continue current dose, check in 4 weeks  

## 2012-05-03 DIAGNOSIS — Z09 Encounter for follow-up examination after completed treatment for conditions other than malignant neoplasm: Secondary | ICD-10-CM | POA: Diagnosis not present

## 2012-05-03 DIAGNOSIS — Z8542 Personal history of malignant neoplasm of other parts of uterus: Secondary | ICD-10-CM | POA: Diagnosis not present

## 2012-05-03 DIAGNOSIS — I10 Essential (primary) hypertension: Secondary | ICD-10-CM | POA: Diagnosis not present

## 2012-05-03 DIAGNOSIS — Z7901 Long term (current) use of anticoagulants: Secondary | ICD-10-CM | POA: Diagnosis not present

## 2012-05-03 DIAGNOSIS — I4891 Unspecified atrial fibrillation: Secondary | ICD-10-CM | POA: Diagnosis not present

## 2012-05-13 ENCOUNTER — Ambulatory Visit (INDEPENDENT_AMBULATORY_CARE_PROVIDER_SITE_OTHER): Payer: Medicare Other | Admitting: Family Medicine

## 2012-05-13 ENCOUNTER — Encounter: Payer: Self-pay | Admitting: Family Medicine

## 2012-05-13 VITALS — BP 122/66 | HR 92 | Temp 98.2°F | Wt 182.2 lb

## 2012-05-13 DIAGNOSIS — S93409A Sprain of unspecified ligament of unspecified ankle, initial encounter: Secondary | ICD-10-CM

## 2012-05-13 DIAGNOSIS — S93401A Sprain of unspecified ligament of right ankle, initial encounter: Secondary | ICD-10-CM

## 2012-05-13 NOTE — Assessment & Plan Note (Addendum)
Right ankle injury suffered 2d ago. Anticipate mild lateral ankle sprain with significant swelling and mild bruising (likely from coumadin). No evidence of bleed, fracture, cellulitis or DVT or other pathology. Treat with ASO brace, continue tylenol, lateral ankle sprain exercises from SM pt advisor. rtc 1 wk for recheck swelling.

## 2012-05-13 NOTE — Patient Instructions (Signed)
I think you have lateral ankle sprain - use ASO brace when you're on your feet, but keep ankle elevated as much as able to. Use ice for swelling for 15 min at a time, 2-3 times daily. Continue tylenol Do exercises provided today. Return in 1 week for recheck, sooner if worsening.  If doing great in 1 week, may cancel appointment.

## 2012-05-13 NOTE — Progress Notes (Signed)
  Subjective:    Patient ID: Erica Bridges, female    DOB: 16-Jul-1927, 78 y.o.   MRN: [id number]  HPI CC: sprained ankle, right  DOI: 05/11/2012.  Sprained right ankle Wednesday night.  Was with husband at hospital, then when arrived home, fell asleep on couch at night, telephone rang and twisted ankle when trying to reach phone.  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  Not really hurting.  Yesterday afternoon had sneakers on, when removed, noted significant swelling.  Then today swelling and bruising present.   No h/o ankle fracture or leg problem in past.  H/o MRSA infection on left big toe.  Review of Systems Per HPI    Objective:   Physical Exam  Nursing note and vitals reviewed. Constitutional: She appears well-developed and well-nourished. No distress.  Cardiovascular:  Pulses:      Dorsalis pedis pulses are 2+ on the right side, and 2+ on the left side.  Musculoskeletal:       L ankle WNL R ankle: Swelling evident with 2+ pitting edema present up to mid calf.  Mild discomfort with inversion but no laxity.  Tender to palpation lateral ankle ligament.  No pain at navicular or at base of 5th MT or with percussion of posterior malleoli.  No warmth present.  Bruising anterior ankle. Able to bear weight without pain.       Assessment & Plan:

## 2012-05-16 DIAGNOSIS — Z1231 Encounter for screening mammogram for malignant neoplasm of breast: Secondary | ICD-10-CM | POA: Diagnosis not present

## 2012-05-18 ENCOUNTER — Encounter: Payer: Self-pay | Admitting: Family Medicine

## 2012-05-19 ENCOUNTER — Encounter: Payer: Self-pay | Admitting: *Deleted

## 2012-05-20 ENCOUNTER — Ambulatory Visit (INDEPENDENT_AMBULATORY_CARE_PROVIDER_SITE_OTHER): Payer: Medicare Other | Admitting: Family Medicine

## 2012-05-20 ENCOUNTER — Encounter: Payer: Self-pay | Admitting: Family Medicine

## 2012-05-20 VITALS — BP 132/78 | HR 82 | Temp 98.2°F | Wt 180.8 lb

## 2012-05-20 DIAGNOSIS — S93401A Sprain of unspecified ligament of right ankle, initial encounter: Secondary | ICD-10-CM

## 2012-05-20 DIAGNOSIS — S93409A Sprain of unspecified ligament of unspecified ankle, initial encounter: Secondary | ICD-10-CM

## 2012-05-20 NOTE — Assessment & Plan Note (Signed)
Healing well.  No xray need today. Continue to monitor. rtc 2 wks for recheck, may cancel appt if doing great. Encouraged continue elevation of leg and ankle exercises.

## 2012-05-20 NOTE — Progress Notes (Signed)
  Subjective:    Patient ID: Erica Bridges, female    DOB: 1927-01-11, 78 y.o.   MRN: [id number]  HPI CC: recheck R ankle sprain  Seen 05/13/2012 with dx R mild ankle sprain with significant bruising/edema.  Placed in ASO brace, treated with tylenol ,ice (on coumadin).  Presents today for 1 wk f/u.  Feels swelling worse, but pain better. Continues to use ASO brace esp for walking.  Trying to elevate leg.  Using ice and tylenol.  Review of Systems Per HPI    Objective:   Physical Exam  Nursing note and vitals reviewed. Constitutional: She appears well-developed and well-nourished. No distress.  Cardiovascular:  Pulses:      Dorsalis pedis pulses are 2+ on the right side, and 2+ on the left side.  Musculoskeletal:       L ankle WNL R ankle: Swelling evident with 1+ pitting edema (improved) present below mid calf.  Minimal discomfort with inversion but no laxity.  Tender to palpation lateral ankle ligament.  No pain at navicular or at base of 5th MT or with percussion of posterior malleoli.  No warmth present.  Bruising resolved. Able to bear weight without pain.       Assessment & Plan:

## 2012-05-20 NOTE — Patient Instructions (Addendum)
I think it's looking well. Continue tylenol continue exercises. Return in 2 weeks for recheck.  If doing great, may cancel appointment. If worsening call me to come in for xray only.

## 2012-05-27 ENCOUNTER — Ambulatory Visit (INDEPENDENT_AMBULATORY_CARE_PROVIDER_SITE_OTHER): Payer: Medicare Other | Admitting: Family Medicine

## 2012-05-27 DIAGNOSIS — Z5181 Encounter for therapeutic drug level monitoring: Secondary | ICD-10-CM | POA: Diagnosis not present

## 2012-05-27 DIAGNOSIS — Z7901 Long term (current) use of anticoagulants: Secondary | ICD-10-CM | POA: Diagnosis not present

## 2012-05-27 DIAGNOSIS — I4891 Unspecified atrial fibrillation: Secondary | ICD-10-CM | POA: Diagnosis not present

## 2012-05-27 NOTE — Patient Instructions (Signed)
Continue current dose, check in 4 weeks  

## 2012-06-22 DIAGNOSIS — B351 Tinea unguium: Secondary | ICD-10-CM | POA: Diagnosis not present

## 2012-06-22 DIAGNOSIS — M79609 Pain in unspecified limb: Secondary | ICD-10-CM | POA: Diagnosis not present

## 2012-06-24 ENCOUNTER — Ambulatory Visit (INDEPENDENT_AMBULATORY_CARE_PROVIDER_SITE_OTHER): Payer: Medicare Other | Admitting: Family Medicine

## 2012-06-24 ENCOUNTER — Ambulatory Visit (INDEPENDENT_AMBULATORY_CARE_PROVIDER_SITE_OTHER): Payer: Medicare Other

## 2012-06-24 DIAGNOSIS — I4891 Unspecified atrial fibrillation: Secondary | ICD-10-CM | POA: Diagnosis not present

## 2012-06-24 DIAGNOSIS — Z7901 Long term (current) use of anticoagulants: Secondary | ICD-10-CM | POA: Diagnosis not present

## 2012-06-24 DIAGNOSIS — Z23 Encounter for immunization: Secondary | ICD-10-CM | POA: Diagnosis not present

## 2012-06-24 DIAGNOSIS — Z5181 Encounter for therapeutic drug level monitoring: Secondary | ICD-10-CM | POA: Diagnosis not present

## 2012-06-24 LAB — POCT INR: INR: 2.2

## 2012-06-24 NOTE — Patient Instructions (Signed)
Continue 2.5 mg daily, 5 mg MWF, recheck 4 weeks 

## 2012-07-22 ENCOUNTER — Ambulatory Visit: Payer: Medicare Other

## 2012-07-25 ENCOUNTER — Ambulatory Visit (INDEPENDENT_AMBULATORY_CARE_PROVIDER_SITE_OTHER): Payer: Medicare Other | Admitting: Family Medicine

## 2012-07-25 DIAGNOSIS — Z7901 Long term (current) use of anticoagulants: Secondary | ICD-10-CM

## 2012-07-25 DIAGNOSIS — Z5181 Encounter for therapeutic drug level monitoring: Secondary | ICD-10-CM

## 2012-07-25 DIAGNOSIS — I4891 Unspecified atrial fibrillation: Secondary | ICD-10-CM | POA: Diagnosis not present

## 2012-07-25 NOTE — Patient Instructions (Signed)
Continue current dose, check in 4 weeks  

## 2012-08-22 ENCOUNTER — Ambulatory Visit: Payer: Medicare Other

## 2012-08-25 ENCOUNTER — Ambulatory Visit (INDEPENDENT_AMBULATORY_CARE_PROVIDER_SITE_OTHER): Payer: Medicare Other | Admitting: General Practice

## 2012-08-25 DIAGNOSIS — Z7901 Long term (current) use of anticoagulants: Secondary | ICD-10-CM | POA: Diagnosis not present

## 2012-08-25 DIAGNOSIS — I4891 Unspecified atrial fibrillation: Secondary | ICD-10-CM | POA: Diagnosis not present

## 2012-08-25 NOTE — Progress Notes (Signed)
Lab Results  Component Value Date   INR 1.8 08/25/2012   INR 1.9 07/25/2012   INR 2.2 06/24/2012

## 2012-09-21 HISTORY — PX: OTHER SURGICAL HISTORY: SHX169

## 2012-09-22 ENCOUNTER — Ambulatory Visit (INDEPENDENT_AMBULATORY_CARE_PROVIDER_SITE_OTHER): Payer: Medicare Other | Admitting: General Practice

## 2012-09-22 DIAGNOSIS — Z7901 Long term (current) use of anticoagulants: Secondary | ICD-10-CM | POA: Diagnosis not present

## 2012-09-22 DIAGNOSIS — I4891 Unspecified atrial fibrillation: Secondary | ICD-10-CM

## 2012-10-20 ENCOUNTER — Ambulatory Visit (INDEPENDENT_AMBULATORY_CARE_PROVIDER_SITE_OTHER): Payer: Medicare Other | Admitting: General Practice

## 2012-10-20 ENCOUNTER — Ambulatory Visit: Payer: Medicare Other

## 2012-10-20 DIAGNOSIS — Z7901 Long term (current) use of anticoagulants: Secondary | ICD-10-CM

## 2012-10-20 DIAGNOSIS — I4891 Unspecified atrial fibrillation: Secondary | ICD-10-CM

## 2012-11-17 ENCOUNTER — Ambulatory Visit (INDEPENDENT_AMBULATORY_CARE_PROVIDER_SITE_OTHER): Payer: Medicare Other | Admitting: General Practice

## 2012-11-17 DIAGNOSIS — I4891 Unspecified atrial fibrillation: Secondary | ICD-10-CM

## 2012-11-17 DIAGNOSIS — Z5181 Encounter for therapeutic drug level monitoring: Secondary | ICD-10-CM | POA: Diagnosis not present

## 2012-11-17 DIAGNOSIS — Z7901 Long term (current) use of anticoagulants: Secondary | ICD-10-CM

## 2012-11-17 LAB — POCT INR: INR: 3.1

## 2012-11-27 ENCOUNTER — Other Ambulatory Visit: Payer: Self-pay | Admitting: Family Medicine

## 2012-11-27 DIAGNOSIS — I10 Essential (primary) hypertension: Secondary | ICD-10-CM

## 2012-11-27 DIAGNOSIS — E7800 Pure hypercholesterolemia, unspecified: Secondary | ICD-10-CM

## 2012-11-29 ENCOUNTER — Other Ambulatory Visit (INDEPENDENT_AMBULATORY_CARE_PROVIDER_SITE_OTHER): Payer: Medicare Other

## 2012-11-29 DIAGNOSIS — E7800 Pure hypercholesterolemia, unspecified: Secondary | ICD-10-CM

## 2012-11-29 DIAGNOSIS — I10 Essential (primary) hypertension: Secondary | ICD-10-CM

## 2012-11-29 LAB — LIPID PANEL
LDL Cholesterol: 49 mg/dL (ref 0–99)
Total CHOL/HDL Ratio: 2
VLDL: 22 mg/dL (ref 0.0–40.0)

## 2012-11-29 LAB — BASIC METABOLIC PANEL
Calcium: 10.1 mg/dL (ref 8.4–10.5)
GFR: 54.31 mL/min — ABNORMAL LOW (ref >60.00)
Potassium: 3.6 mEq/L (ref 3.5–5.1)
Sodium: 139 mEq/L (ref 135–145)

## 2012-11-29 LAB — TSH: TSH: 3.41 u[IU]/mL (ref 0.35–5.50)

## 2012-12-06 ENCOUNTER — Encounter: Payer: Medicare Other | Admitting: Family Medicine

## 2012-12-15 ENCOUNTER — Encounter: Payer: Self-pay | Admitting: Family Medicine

## 2012-12-15 ENCOUNTER — Ambulatory Visit (INDEPENDENT_AMBULATORY_CARE_PROVIDER_SITE_OTHER): Payer: Medicare Other | Admitting: General Practice

## 2012-12-15 ENCOUNTER — Ambulatory Visit (INDEPENDENT_AMBULATORY_CARE_PROVIDER_SITE_OTHER): Payer: Medicare Other | Admitting: Family Medicine

## 2012-12-15 VITALS — BP 118/82 | HR 72 | Temp 97.6°F | Ht 69.0 in | Wt 178.2 lb

## 2012-12-15 DIAGNOSIS — D45 Polycythemia vera: Secondary | ICD-10-CM

## 2012-12-15 DIAGNOSIS — I4891 Unspecified atrial fibrillation: Secondary | ICD-10-CM

## 2012-12-15 DIAGNOSIS — Z5181 Encounter for therapeutic drug level monitoring: Secondary | ICD-10-CM

## 2012-12-15 DIAGNOSIS — Z7901 Long term (current) use of anticoagulants: Secondary | ICD-10-CM | POA: Diagnosis not present

## 2012-12-15 DIAGNOSIS — C549 Malignant neoplasm of corpus uteri, unspecified: Secondary | ICD-10-CM | POA: Diagnosis not present

## 2012-12-15 DIAGNOSIS — E7800 Pure hypercholesterolemia, unspecified: Secondary | ICD-10-CM

## 2012-12-15 DIAGNOSIS — Z0000 Encounter for general adult medical examination without abnormal findings: Secondary | ICD-10-CM | POA: Diagnosis not present

## 2012-12-15 DIAGNOSIS — I10 Essential (primary) hypertension: Secondary | ICD-10-CM

## 2012-12-15 DIAGNOSIS — D751 Secondary polycythemia: Secondary | ICD-10-CM

## 2012-12-15 DIAGNOSIS — Z86010 Personal history of colonic polyps: Secondary | ICD-10-CM | POA: Diagnosis not present

## 2012-12-15 LAB — POCT INR: INR: 2.2

## 2012-12-15 MED ORDER — PRAVASTATIN SODIUM 40 MG PO TABS: 40.0000 mg | ORAL_TABLET | Freq: Every day | ORAL | Status: DC

## 2012-12-15 NOTE — Assessment & Plan Note (Signed)
Chronic, stable. Continue meds. 

## 2012-12-15 NOTE — Assessment & Plan Note (Signed)
Recheck CBC next year. Remote smoker. epo normal.

## 2012-12-15 NOTE — Assessment & Plan Note (Signed)
I have personally reviewed the Medicare Annual Wellness questionnaire and have noted 1. The patient's medical and social history 2. Their use of alcohol, tobacco or illicit drugs 3. Their current medications and supplements 4. The patient's functional ability including ADL's, fall risks, home safety risks and hearing or visual impairment. 5. Diet and physical activity 6. Evidence for depression or mood disorders The patients weight, height, BMI have been recorded in the chart.  Hearing and vision has been addressed. I have made referrals, counseling and provided education to the patient based review of the above and I have provided the pt with a written personalized care plan for preventive services. See scanned questionairre.  Reviewed preventative protocols and updated unless pt declined.  Discussed colon cancer screening.  Pt states she feels more comfortable with checking iFOB today - so I sent her home with this.  If positive, would need to decide if wishes to pursue colonoscopy. Declines intervention for depression - discussed caregiver stress.

## 2012-12-15 NOTE — Patient Instructions (Addendum)
Pass by lab to pick up stool kit today. Call your insurance about the shingles shot to see if it is covered or how much it would cost and where is cheaper (here or pharmacy).  If you want to receive here, call for nurse visit. Decrease cholesterol medication to one pill daily (instead of two). Coumadin clinic today.

## 2012-12-15 NOTE — Assessment & Plan Note (Signed)
Followed by Erica Bridges.

## 2012-12-15 NOTE — Assessment & Plan Note (Signed)
Chronic, stable. Great control.  Decrease to 40mg  QHS.

## 2012-12-15 NOTE — Progress Notes (Signed)
No charged coumadin clinic because seen for AMW today.

## 2012-12-15 NOTE — Progress Notes (Signed)
Subjective:    Patient ID: Erica Bridges, female    DOB: [birth year], 79 y.o.   MRN: [id number]  HPI CC: medicare wellness visit  Pleasant 79 yo with h/o afib on coumadin, HTN, h/o endometrial cancer presents for medicare wellness visit.  Lab Results  Component Value Date   INR 3.1 11/17/2012   INR 3.1 10/20/2012   INR 1.8 09/22/2012  Has been increasing leafy greens.  Takes care of husband who suffers from dementia and diabetes. Polycythemia - mild for years, has not seen heme in past.  H/o uterine cancer followed by [facility] (oncologist Dr. Seward Carol), planning to see back in 04/2013.   Wt Readings from Last 3 Encounters:  12/15/12 178 lb 4 oz (80.854 kg)  05/20/12 180 lb 12 oz (81.988 kg)  05/13/12 182 lb 4 oz (82.668 kg)  weight stable, has changed diet and is trying to lose weight.  Passes vision screen.  To schedule appointment with eye doctor next month. Hearing - uses aides regularly. Denies more than 1 fall in last year - attributes to getting up too slowly. PHQ9 = 6.  Not interested in intervention currently.  Discussed caregiver stress.  Preventative:  Colonoscopy 2007, polyps, rec rpt 5 yrs (Dr. Leone Payor).  1 hyperplastic and 1 adenomatous.  Saw Dr. Leone Payor last year, his note mentions decision to stop colon cancer screening.  However in discussion with patient today she does desire to continue screening with iFOB H/o uterine cancer s/p hysterectomy 2005  Mammogram 04/2012, rpt due 04/2013. Pt sets this up herself.  Dexa - states had several years ago and normal.  No records in our chart. Well woman [facility].  Flu shot - 06/2012 Has never had shingles shot - will look into insurance.  Td 2010  Pneumonia shot 2006 - completed  Medications and allergies reviewed and updated in chart.  Past histories reviewed and updated if relevant as below. Patient Active Problem List  Diagnosis  . CANCER, ENDOMETRIUM  . HYPERCHOLESTEROLEMIA  . HYPERTENSION  . Atrial  fibrillation  . REACTIVE AIRWAY DISEASE  . DIVERTICULOSIS, COLON  . UNSPECIFIED MENOPAUSAL&POSTMENOPAUSAL DISORDER  . DERMATITIS, OTHER ATOPIC  . Medicare annual wellness visit, subsequent  . Polycythemia  . History of colon polyps  . Sprain of ankle, right   Past Medical History  Diagnosis Date  . Atrial fibrillation   . Malignant neoplasm of corpus uteri, except isthmus     followed by Roma Kayser with yearly visits  . Diverticulosis of colon (without mention of hemorrhage)   . HLD (hyperlipidemia)   . HTN (hypertension)   . Onychia and paronychia of toe   . Asthma   . Polycythemia 11/2011    normal EPO and periph smear  . Benign neoplasm of colon 10/22/2005    Hyperplastic  & Adenomatous polyps   . External hemorrhoids without mention of complication   . Diverticulosis of colon (without mention of hemorrhage)    Past Surgical History  Procedure Laterality Date  . Cataract extraction      bilateral  . Total abdominal hysterectomy w/ bilateral salpingoophorectomy  2005    uterine adenocarcinoma  . Colonoscopy  10/22/05    polyps-bx negative; divertics, ext hemmorhoids  . Back surgery  1970s    lumbar   History  Substance Use Topics  . Smoking status: Former Smoker    Quit date: 09/21/1982  . Smokeless tobacco: Never Used  . Alcohol Use: No   Family History  Problem Relation Age of Onset  .  Hypertension Father   . Stroke Father   . Diabetes Mother   . Coronary artery disease Mother   . Hypertension Mother   . Colon cancer Neg Hx    Allergies  Allergen Reactions  . Amoxicillin     REACTION: rashj  . Penicillins     REACTION: rash   Current Outpatient Prescriptions on File Prior to Visit  Medication Sig Dispense Refill  . acetaminophen (TYLENOL) 650 MG CR tablet Take 650 mg by mouth every 8 (eight) hours as needed.      Marland Kitchen atenolol (TENORMIN) 50 MG tablet TAKE 1 TABLET BY MOUTH ONCE A DAY  90 tablet  3  . Calcium Carbonate-Vitamin D (CALCIUM 600+D) 600-400  MG-UNIT per tablet Take 1 tablet by mouth daily.       . cetirizine (ZYRTEC) 10 MG tablet Take 10 mg by mouth daily as needed.      . cholecalciferol (VITAMIN D) 1000 UNITS tablet Take 1,000 Units by mouth daily.      . Coenzyme Q10 200 MG capsule Take 200 mg by mouth daily.      Bertram Gala Glycol-Propyl Glycol (SYSTANE) 0.4-0.3 % SOLN Apply 1 drop to eye 4 (four) times daily.      . potassium chloride (K-DUR) 10 MEQ tablet TAKE 1 TABLET BY MOUTH ONCE A DAY  90 tablet  3  . pravastatin (PRAVACHOL) 40 MG tablet Take 2 tablets (80 mg total) by mouth daily.  180 tablet  3  . triamterene-hydrochlorothiazide (MAXZIDE) 75-50 MG per tablet TAKE 1 TABLET BY MOUTH ONCE A DAY  90 tablet  3  . warfarin (COUMADIN) 5 MG tablet Take as directed  100 tablet  3   No current facility-administered medications on file prior to visit.    Review of Systems  Constitutional: Negative for fever, chills, activity change, appetite change, fatigue and unexpected weight change.  HENT: Negative for hearing loss and neck pain.   Eyes: Negative for visual disturbance.  Respiratory: Negative for cough, chest tightness, shortness of breath and wheezing.   Cardiovascular: Negative for chest pain, palpitations and leg swelling.  Gastrointestinal: Negative for nausea, vomiting, abdominal pain, diarrhea, constipation, blood in stool and abdominal distention.  Genitourinary: Negative for hematuria and difficulty urinating.  Musculoskeletal: Negative for myalgias and arthralgias.  Skin: Negative for rash.  Neurological: Negative for dizziness, seizures, syncope and headaches.  Hematological: Negative for adenopathy. Does not bruise/bleed easily.  Psychiatric/Behavioral: Negative for dysphoric mood. The patient is not nervous/anxious.        Objective:   Physical Exam  Nursing note and vitals reviewed. Constitutional: She is oriented to person, place, and time. She appears well-developed and well-nourished. No distress.   HENT:  Head: Normocephalic and atraumatic.  Right Ear: External ear normal.  Left Ear: External ear normal.  Nose: Nose normal.  Mouth/Throat: Oropharynx is clear and moist. No oropharyngeal exudate.  Eyes: Conjunctivae and EOM are normal. Pupils are equal, round, and reactive to light. No scleral icterus.  Neck: Normal range of motion. Neck supple. Carotid bruit is not present. No thyromegaly present.  Cardiovascular: Normal rate, normal heart sounds and intact distal pulses.  An irregularly irregular rhythm present.  No murmur heard. Pulses:      Radial pulses are 2+ on the right side, and 2+ on the left side.  Rate controlled  Pulmonary/Chest: Effort normal and breath sounds normal. No respiratory distress. She has no wheezes. She has no rales.  Abdominal: Soft. Bowel sounds are normal. She  exhibits no distension and no mass. There is no tenderness. There is no rebound and no guarding.  Musculoskeletal: Normal range of motion. She exhibits no edema.  Lymphadenopathy:    She has no cervical adenopathy.  Neurological: She is alert and oriented to person, place, and time.  CN grossly intact, station and gait intact Steady on feet  Skin: Skin is warm and dry. No rash noted.  Psychiatric: She has a normal mood and affect. Her behavior is normal. Judgment and thought content normal.      Assessment & Plan:

## 2012-12-15 NOTE — Assessment & Plan Note (Signed)
Rate controlled. Coumadin clinic today.

## 2012-12-27 ENCOUNTER — Other Ambulatory Visit: Payer: Self-pay | Admitting: Family Medicine

## 2013-01-04 ENCOUNTER — Other Ambulatory Visit: Payer: Medicare Other

## 2013-01-04 DIAGNOSIS — Z86010 Personal history of colonic polyps: Secondary | ICD-10-CM

## 2013-01-04 LAB — FECAL OCCULT BLOOD, IMMUNOCHEMICAL: Fecal Occult Bld: POSITIVE

## 2013-01-12 ENCOUNTER — Ambulatory Visit (INDEPENDENT_AMBULATORY_CARE_PROVIDER_SITE_OTHER): Payer: Medicare Other | Admitting: General Practice

## 2013-01-12 DIAGNOSIS — Z5181 Encounter for therapeutic drug level monitoring: Secondary | ICD-10-CM | POA: Diagnosis not present

## 2013-01-12 DIAGNOSIS — Z7901 Long term (current) use of anticoagulants: Secondary | ICD-10-CM

## 2013-01-12 DIAGNOSIS — I4891 Unspecified atrial fibrillation: Secondary | ICD-10-CM | POA: Diagnosis not present

## 2013-01-12 LAB — POCT INR: INR: 2.5

## 2013-01-17 DIAGNOSIS — H04129 Dry eye syndrome of unspecified lacrimal gland: Secondary | ICD-10-CM | POA: Diagnosis not present

## 2013-01-17 DIAGNOSIS — H40009 Preglaucoma, unspecified, unspecified eye: Secondary | ICD-10-CM | POA: Diagnosis not present

## 2013-01-17 DIAGNOSIS — H26499 Other secondary cataract, unspecified eye: Secondary | ICD-10-CM | POA: Diagnosis not present

## 2013-01-30 ENCOUNTER — Telehealth: Payer: Self-pay | Admitting: *Deleted

## 2013-01-30 DIAGNOSIS — Z86010 Personal history of colonic polyps: Secondary | ICD-10-CM

## 2013-01-30 DIAGNOSIS — R195 Other fecal abnormalities: Secondary | ICD-10-CM

## 2013-01-30 NOTE — Telephone Encounter (Signed)
Patient called and decided she would like to proceed with colonoscopy. She would like to go to [place]. I advised to await call from [facility].

## 2013-02-06 ENCOUNTER — Encounter: Payer: Self-pay | Admitting: Internal Medicine

## 2013-02-06 NOTE — Telephone Encounter (Signed)
Discussed at last physical.  I've placed referral to GI to discuss all colon cancer screening options given positive iFOB and polyp hx, as pt desires this.

## 2013-02-09 ENCOUNTER — Ambulatory Visit (INDEPENDENT_AMBULATORY_CARE_PROVIDER_SITE_OTHER): Payer: Medicare Other | Admitting: General Practice

## 2013-02-09 DIAGNOSIS — Z7901 Long term (current) use of anticoagulants: Secondary | ICD-10-CM

## 2013-02-09 DIAGNOSIS — Z5181 Encounter for therapeutic drug level monitoring: Secondary | ICD-10-CM | POA: Diagnosis not present

## 2013-02-09 DIAGNOSIS — I4891 Unspecified atrial fibrillation: Secondary | ICD-10-CM | POA: Diagnosis not present

## 2013-02-09 LAB — POCT INR: INR: 2.4

## 2013-02-27 ENCOUNTER — Other Ambulatory Visit: Payer: Self-pay | Admitting: Family Medicine

## 2013-03-06 ENCOUNTER — Ambulatory Visit (INDEPENDENT_AMBULATORY_CARE_PROVIDER_SITE_OTHER): Payer: Medicare Other | Admitting: Internal Medicine

## 2013-03-06 ENCOUNTER — Other Ambulatory Visit (INDEPENDENT_AMBULATORY_CARE_PROVIDER_SITE_OTHER): Payer: Medicare Other

## 2013-03-06 ENCOUNTER — Telehealth: Payer: Self-pay

## 2013-03-06 ENCOUNTER — Encounter: Payer: Self-pay | Admitting: Internal Medicine

## 2013-03-06 VITALS — BP 96/54 | HR 76 | Ht 68.0 in | Wt 175.1 lb

## 2013-03-06 DIAGNOSIS — K612 Anorectal abscess: Secondary | ICD-10-CM | POA: Diagnosis not present

## 2013-03-06 DIAGNOSIS — I4891 Unspecified atrial fibrillation: Secondary | ICD-10-CM

## 2013-03-06 DIAGNOSIS — Z7901 Long term (current) use of anticoagulants: Secondary | ICD-10-CM

## 2013-03-06 DIAGNOSIS — K610 Anal abscess: Secondary | ICD-10-CM

## 2013-03-06 DIAGNOSIS — K648 Other hemorrhoids: Secondary | ICD-10-CM | POA: Diagnosis not present

## 2013-03-06 DIAGNOSIS — K625 Hemorrhage of anus and rectum: Secondary | ICD-10-CM

## 2013-03-06 DIAGNOSIS — R195 Other fecal abnormalities: Secondary | ICD-10-CM

## 2013-03-06 LAB — CBC WITH DIFFERENTIAL/PLATELET
Basophils Absolute: 0.1 10*3/uL (ref 0.0–0.1)
Eosinophils Absolute: 0.3 10*3/uL (ref 0.0–0.7)
Lymphocytes Relative: 13.5 % (ref 12.0–46.0)
MCHC: 32.8 g/dL (ref 30.0–36.0)
Neutrophils Relative %: 69.3 % (ref 43.0–77.0)
RDW: 13 % (ref 11.5–14.6)

## 2013-03-06 MED ORDER — CIPROFLOXACIN HCL 500 MG PO TABS: 500.0000 mg | ORAL_TABLET | Freq: Two times a day (BID) | ORAL | Status: DC

## 2013-03-06 MED ORDER — NA SULFATE-K SULFATE-MG SULF 17.5-3.13-1.6 GM/177ML PO SOLN: ORAL | Status: DC

## 2013-03-06 MED ORDER — METRONIDAZOLE 500 MG PO TABS: 500.0000 mg | ORAL_TABLET | Freq: Two times a day (BID) | ORAL | Status: DC

## 2013-03-06 NOTE — Telephone Encounter (Signed)
Received letter from GI - pt scheduled for colonoscopy with possible biopsy on 03/27/2013. Anticoagulant indication - atrial fibrillation. I recommend holding coumadin for 5 days prior to procedure, then may restart coumadin on night after procedure.  I also noted she's been placed on cipro and flagyl - will merit sooner anticoagulation visit - will route today to Arline Asp and Carlena Sax to have them call to check pt this week and change coumadin dosing as needed.

## 2013-03-06 NOTE — Patient Instructions (Addendum)
You have been scheduled for a colonoscopy with propofol. Please follow written instructions given to you at your visit today.  Please use the prep kit (suprep) given to you today.  Cancelled the kit sent to pharmacy in error, spoke to Rob at [employer]. If you use inhalers (even only as needed), please bring them with you on the day of your procedure. Your physician has requested that you go to www.startemmi.com and enter the access code given to you at your visit today. This web site gives a general overview about your procedure. However, you should still follow specific instructions given to you by our office regarding your preparation for the procedure.  You will be contaced by our office prior to your procedure for directions on holding your Coumadin/Warfarin.  If you do not hear from our office 1 week prior to your scheduled procedure, please call [phone] to discuss.  We have sent the following medications to your pharmacy for you to pick up at your convenience: Cipro, Flagyl.  Please notify  Dr. Sharen Hones that your on these medicines as they can affect your coumadin levels.   Your physician has requested that you go to the basement for the following lab work before leaving today: CBC/diff  We are referring you to [facility] for the abscess.  I will contact you with appointment date and time.  I appreciate the opportunity to care for you.

## 2013-03-06 NOTE — Telephone Encounter (Signed)
  03/06/2013    RE: Erica Bridges DOB: November 23, 1926 MRN: [id number]   Dear Eustaquio Boyden M.D.,    We have scheduled the above patient for an endoscopic procedure. Our records show that she is on anticoagulation therapy.   Please advise as to how long the patient may come off her therapy of coumadin prior to the colonoscopy procedure, which is scheduled for 03/27/2013.  Please fax back/ or route the completed form to Rehmat Murtagh [place], CMA at [phone].   Sincerely,  [place], Clayborne Dana

## 2013-03-06 NOTE — Progress Notes (Signed)
Subjective:    Patient ID: Erica Bridges, female    DOB: [birth year], 79 y.o.   MRN: [id number]  HPI This is a very nice elderly woman here because of a heme positive stool. I had seen her last year and she elected to forego routine colonoscopy. She decided to have an immune fecal occult blood testing at her wellness physical this year it was positive. She has been having intermittent bright red blood per rectum on the tissue paper usually think sometimes in the commode. She believes she has hemorrhoids. He has never been dark. She has some chronic diarrhea that may be somewhat worse. Stools are generally loose ever since she had radiation for uterine cancer 9 years ago. She is also noticed a lump in her groin area actually it's in the area of the right labia majora. It's been there for weeks, she wondered if she didn't splash some toilet cleaner on herself when she was using the commode hasn't seemed to burn one day after she had placed at but had forgotten to flush prior to using the toilet. Allergies  Allergen Reactions  . Amoxicillin     REACTION: rashj  . Penicillins     REACTION: rash   Outpatient Prescriptions Prior to Visit  Medication Sig Dispense Refill  . acetaminophen (TYLENOL) 650 MG CR tablet Take 650 mg by mouth every 8 (eight) hours as needed.      Marland Kitchen atenolol (TENORMIN) 50 MG tablet TAKE 1 TABLET BY MOUTH ONCE A DAY  90 tablet  3  . Calcium Carbonate-Vitamin D (CALCIUM 600+D) 600-400 MG-UNIT per tablet Take 1 tablet by mouth daily.       . cetirizine (ZYRTEC) 10 MG tablet Take 10 mg by mouth daily as needed.      . cholecalciferol (VITAMIN D) 1000 UNITS tablet Take 1,000 Units by mouth daily.      . Coenzyme Q10 200 MG capsule Take 200 mg by mouth daily.      Marland Kitchen KLOR-CON M10 10 MEQ tablet TAKE 1 TABLET BY MOUTH ONCE A DAY  90 tablet  3  . Polyethyl Glycol-Propyl Glycol (SYSTANE) 0.4-0.3 % SOLN Apply 1 drop to eye 4 (four) times daily.      . potassium chloride (K-DUR) 10 MEQ  tablet TAKE 1 TABLET BY MOUTH ONCE A DAY  90 tablet  3  . pravastatin (PRAVACHOL) 40 MG tablet Take 1 tablet (40 mg total) by mouth daily.  90 tablet  3  . triamterene-hydrochlorothiazide (MAXZIDE) 75-50 MG per tablet TAKE 1 TABLET BY MOUTH ONCE A DAY  90 tablet  3  . warfarin (COUMADIN) 5 MG tablet TAKE AS DIRECTED  100 tablet  3   No facility-administered medications prior to visit.   Past Medical History  Diagnosis Date  . Atrial fibrillation   . Malignant neoplasm of corpus uteri, except isthmus     followed by Roma Kayser with yearly visits  . HLD (hyperlipidemia)   . HTN (hypertension)   . Onychia and paronychia of toe   . Asthma   . Polycythemia 11/2011    normal EPO and periph smear  . Benign neoplasm of colon 10/22/2005    Hyperplastic  & Adenomatous polyps   . External hemorrhoids without mention of complication   . Diverticulosis of colon (without mention of hemorrhage)   . Uterine adenocarcinoma TAH, XRT  . Pneumonia    Past Surgical History  Procedure Laterality Date  . Cataract extraction Bilateral   . Total abdominal hysterectomy  w/ bilateral salpingoophorectomy  2005    uterine adenocarcinoma  . Colonoscopy  10/22/05    polyps-bx negative; divertics, ext hemmorhoids  . Lumbar disc surgery  1970s  . Refractive surgery Bilateral   . Tonsillectomy     History   Social History  . Marital Status: Married    Spouse Name: N/A    Number of Children: 3  . Years of Education: N/A   Occupational History  . Retired Catering manager    Social History Main Topics  . Smoking status: Former Smoker    Types: Cigarettes    Quit date: 09/21/1982  . Smokeless tobacco: Never Used  . Alcohol Use: No  . Drug Use: No    Social History Narrative   Daily caffeine    Family History  Problem Relation Age of Onset  . Hypertension Father   . Stroke Father   . Diabetes Mother   . Coronary artery disease Mother   . Hypertension Mother   . Colon cancer Neg Hx      Review of  Systems Complete review of systems obtained, she has had some hematuria at times decreased hearing muscle cramps dyspnea and fatigue. All other review of systems are negative or as above.    Objective:   Physical Exam General:  Well-developed, well-nourished and in no acute distress Eyes:  anicteric. ENT:   Mouth and posterior pharynx free of lesions. + dentures Neck:   supple w/o thyromegaly or mass.  Lungs: Clear to auscultation bilaterally. Heart:  S1S2, no rubs, murmurs, gallops. Abdomen:  soft, non-tender, no hepatosplenomegaly, hernia, or mass and BS+.  Rectal: With female staff present, there is a skin tag in the left lateral position, no masses palpated. On the distal aspect of the right labia majora there is an erythematous swollen area with 2 pinpoint areas exuding possible pressure. It is somewhat firm it is not tender, it is perhaps 6-7 mm 8 tops. There is no underlying fluctuance. An anoscopy is performed, demonstrating prominent hemorrhoid in the left lateral position, and small ones in right anterior right posterior. They're not particularly inflamed. Lymph:  no cervical or supraclavicular adenopathy. Extremities:   no edema Skin   no rash. Neuro:  A&O x 3.  Psych:  appropriate mood and  Affect.   Data Reviewed: PCP notes iFOBT test Lab Results  Component Value Date   INR 2.4 02/09/2013   INR 2.5 01/12/2013   INR 2.2 12/15/2012   2007 colonoscopy 2013 GI note    Assessment & Plan:   1. Heme positive stool   2. Rectal bleeding   3. Internal hemorrhoids with complication   4. Perianal abscess   5. Warfarin anticoagulation   6. Atrial fibrillation    1. For the positive stool and rectal bleeding, we'll schedule a colonoscopy. I think at this point that makes sense given her active symptoms. She is also complaining of some diarrhea that seems somewhat chronic. It is possible she could have inflammatory bowel disease as she has this abscess though I cannot tell if  that's the GI or GYN situation.The risks and benefits as well as alternatives of endoscopic procedure(s) have been discussed and reviewed. All questions answered. The patient agrees to proceed. She was also advised that there is an increased risk of having a stroke off warfarin, and I recommend she hold rin for a few days prior to the colonoscopy will check with her prescriber, I think that is Dr. Sharen Hones to make sure it's okay to  hold that and will work that out with the anticoagulation clinic. 2. Cipro and metronidazole are prescribed for her perianal abscess. This is closer to the distal aspect of the right major labial fold so it could be GYN in origin 3. Gynecology appointment is requested by the patient, she had been followed in [facility] in the gynecology oncology clinic for years but that MD is no longer going to be in practice and she wishes to have a gynecologist in [place]. She has a history of uterine cancer many years ago. 4. Will also check a CBC today and have also advised her to try some sitz baths  CC: Eustaquio Boyden, MD

## 2013-03-07 ENCOUNTER — Telehealth: Payer: Self-pay

## 2013-03-07 NOTE — Telephone Encounter (Signed)
Appt made for Coumadin Clinic on 6/19.

## 2013-03-07 NOTE — Telephone Encounter (Signed)
Patient informed of appointment with Reynaldo Minium M.D. On 03/17/13, check in at 12:15pm.  Given phone #, address and no show charge policy.

## 2013-03-07 NOTE — Telephone Encounter (Signed)
Patient informed to hold coumadin after her July 1st dose per Eustaquio Boyden M.D. And she will go to coumadin clinic 6/19 to have levels checked since on antibotics.  She verbalized understanding.

## 2013-03-09 ENCOUNTER — Ambulatory Visit (INDEPENDENT_AMBULATORY_CARE_PROVIDER_SITE_OTHER): Payer: Medicare Other | Admitting: Family Medicine

## 2013-03-09 DIAGNOSIS — Z7901 Long term (current) use of anticoagulants: Secondary | ICD-10-CM | POA: Diagnosis not present

## 2013-03-09 DIAGNOSIS — Z5181 Encounter for therapeutic drug level monitoring: Secondary | ICD-10-CM | POA: Diagnosis not present

## 2013-03-09 DIAGNOSIS — I4891 Unspecified atrial fibrillation: Secondary | ICD-10-CM

## 2013-03-09 LAB — POCT INR: INR: 2.7

## 2013-03-17 ENCOUNTER — Other Ambulatory Visit (HOSPITAL_COMMUNITY)
Admission: RE | Admit: 2013-03-17 | Discharge: 2013-03-17 | Disposition: A | Discharge status: Home / Self Care | Payer: Medicare Other | Source: Ambulatory Visit | Attending: Gynecology | Admitting: Gynecology

## 2013-03-17 ENCOUNTER — Encounter: Payer: Self-pay | Admitting: Gynecology

## 2013-03-17 ENCOUNTER — Ambulatory Visit (INDEPENDENT_AMBULATORY_CARE_PROVIDER_SITE_OTHER): Payer: Medicare Other | Admitting: Gynecology

## 2013-03-17 VITALS — BP 120/76 | Ht 67.5 in | Wt 170.0 lb

## 2013-03-17 DIAGNOSIS — N760 Acute vaginitis: Secondary | ICD-10-CM

## 2013-03-17 DIAGNOSIS — Z8542 Personal history of malignant neoplasm of other parts of uterus: Secondary | ICD-10-CM

## 2013-03-17 DIAGNOSIS — Z01419 Encounter for gynecological examination (general) (routine) without abnormal findings: Secondary | ICD-10-CM | POA: Diagnosis not present

## 2013-03-17 DIAGNOSIS — Z1151 Encounter for screening for human papillomavirus (HPV): Secondary | ICD-10-CM | POA: Diagnosis not present

## 2013-03-17 DIAGNOSIS — L723 Sebaceous cyst: Secondary | ICD-10-CM

## 2013-03-17 DIAGNOSIS — N763 Subacute and chronic vulvitis: Secondary | ICD-10-CM

## 2013-03-17 DIAGNOSIS — Z1272 Encounter for screening for malignant neoplasm of vagina: Secondary | ICD-10-CM

## 2013-03-17 LAB — CA 125: CA 125: 8.2 U/mL (ref 0.0–30.2)

## 2013-03-17 MED ORDER — NYSTATIN-TRIAMCINOLONE 100000-0.1 UNIT/GM-% EX CREA: TOPICAL_CREAM | CUTANEOUS | Status: DC

## 2013-03-17 NOTE — Patient Instructions (Addendum)
Candidal Vulvovaginitis  Candidal vulvovaginitis is an infection of the vagina and vulva. The vulva is the skin around the opening of the vagina. This may cause itching and discomfort in and around the vagina.   HOME CARE  · Only take medicine as told by your doctor.  · Do not have sex (intercourse) until the infection is healed or as told by your doctor.  · Practice safe sex.  · Tell your sex partner about your infection.  · Do not douche or use tampons.  · Wear cotton underwear. Do not wear tight pants or panty hose.  · Eat yogurt. This may help treat and prevent yeast infections.  GET HELP RIGHT AWAY IF:   · You have a fever.  · Your problems get worse during treatment or do not get better in 3 days.  · You have discomfort, irritation, or itching in your vagina or vulva area.  · You have pain after sex.  · You start to get belly (abdominal) pain.  MAKE SURE YOU:  · Understand these instructions.  · Will watch your condition.  · Will get help right away if you are not doing well or get worse.  Document Released: 12/04/2008 Document Revised: 11/30/2011 Document Reviewed: 12/04/2008  ExitCare® Patient Information ©2014 [employer].

## 2013-03-17 NOTE — Addendum Note (Signed)
Addended by: Ok Edwards on: 03/17/2013 02:24 PM   Modules accepted: Orders

## 2013-03-17 NOTE — Progress Notes (Signed)
Patient is an 79 year old gravida 4 para 3 Ab1 new patient to the practice that was refer to her office by her gastroenterologist Dr. Leone Payor. He had treated her recently for a growing lesion with 2 rounds of antibiotic and patient stated the area has gotten smaller. Patient is in the process of getting a colonoscopy as a result of recent hematochezia. Patient has a past history of a radical hysterectomy and radiation therapy for uterine cancer (stage?) She was being followed by medical oncology group at [facility] and was scheduled to followup in August of this year but her medical oncologist past moved. Patient would like to get established here in  [facility]. Patient states that her last Pap smear was in 2006 before her radical hysterectomy. Her mammogram was reported to be normal in 2012 and she as 1 scheduled coming out. She has mentioned that her last bone density study was approximately 2 years ago.  Exam: Bartholin's urethra and Skene's glands within normal limits. A small right inferior labial majora sebaceous cyst was noted with sebum extruded upon applying pressure. She was noted to have the light is throughout her external genitalia. Vaginal exam shortened vaginal tube with atrophic changes but no gross lesions. Bimanual exam not done. Pap smear was done today.  Assessment/plan: Patient with history of radical hysterectomy and radiation treatment 9 years ago. Patient previously followed in [place], [place] by a medical oncologist we will try to obtain copy of those records. We will also make an appointment for her to see the medical oncologist here in [place], [place]. as well as well to get established. For her vulvitis she will be placed on Mytrex Cream to apply at bedtime each bedtime for 1 month. Because of her history of uterine cancer a CA 125 will be drawn today. She'll return back in October for a full gynecological exam. At which time she will need  her bone density study. For her small decreasing right labial sebaceous cyst she can do sitz bath each bedtime for the next couple weeks and it will resolve completely spontaneously. Patient is scheduled for colonoscopy within the next few weeks. She does have history of benign colon polyps in the past.

## 2013-03-17 NOTE — Addendum Note (Signed)
Addended by: Bertram Savin A on: 03/17/2013 02:48 PM   Modules accepted: Orders

## 2013-03-20 ENCOUNTER — Telehealth: Payer: Self-pay | Admitting: *Deleted

## 2013-03-20 NOTE — Telephone Encounter (Signed)
APPOINTMENT ON 04/17/13 @ 1:1:45 PM PT INFORMED WITH TIME AND DATE.

## 2013-03-20 NOTE — Telephone Encounter (Signed)
Message copied by Aura Camps on Mon Mar 20, 2013  9:36 AM ------      Message from: Ok Edwards      Created: Fri Mar 17, 2013  1:57 PM       Victorino Dike, please make appointment for this patient for the end of July with medical oncologist. Patient with history of uterine cancer treated with radical hysterectomy and radiation treatment 9 years ago was previously been followed by a medical oncologist at [facility] and wants to get established and be seen here in [facility]. We have requested for copy of those records and we will make them available to them as well. Thanks ------

## 2013-03-21 HISTORY — PX: COLONOSCOPY: SHX174

## 2013-03-23 ENCOUNTER — Ambulatory Visit: Payer: Medicare Other

## 2013-03-27 ENCOUNTER — Ambulatory Visit (AMBULATORY_SURGERY_CENTER): Payer: Medicare Other | Admitting: Internal Medicine

## 2013-03-27 ENCOUNTER — Encounter: Payer: Self-pay | Admitting: Internal Medicine

## 2013-03-27 VITALS — BP 105/64 | HR 81 | Temp 97.6°F | Resp 0 | Ht 68.0 in | Wt 175.0 lb

## 2013-03-27 DIAGNOSIS — D126 Benign neoplasm of colon, unspecified: Secondary | ICD-10-CM

## 2013-03-27 DIAGNOSIS — I10 Essential (primary) hypertension: Secondary | ICD-10-CM | POA: Diagnosis not present

## 2013-03-27 DIAGNOSIS — K5520 Angiodysplasia of colon without hemorrhage: Secondary | ICD-10-CM

## 2013-03-27 DIAGNOSIS — R195 Other fecal abnormalities: Secondary | ICD-10-CM

## 2013-03-27 DIAGNOSIS — J45909 Unspecified asthma, uncomplicated: Secondary | ICD-10-CM | POA: Diagnosis not present

## 2013-03-27 DIAGNOSIS — K649 Unspecified hemorrhoids: Secondary | ICD-10-CM | POA: Diagnosis not present

## 2013-03-27 DIAGNOSIS — K5730 Diverticulosis of large intestine without perforation or abscess without bleeding: Secondary | ICD-10-CM | POA: Diagnosis not present

## 2013-03-27 DIAGNOSIS — K648 Other hemorrhoids: Secondary | ICD-10-CM

## 2013-03-27 DIAGNOSIS — Z86010 Personal history of colonic polyps: Secondary | ICD-10-CM | POA: Diagnosis not present

## 2013-03-27 DIAGNOSIS — I4891 Unspecified atrial fibrillation: Secondary | ICD-10-CM | POA: Diagnosis not present

## 2013-03-27 MED ORDER — SODIUM CHLORIDE 0.9 % IV SOLN: 500.0000 mL | INTRAVENOUS | Status: DC

## 2013-03-27 NOTE — Patient Instructions (Addendum)
I found and removed 2 polyps. These look benign.  You also have internal hemorrhoids and one small spot called an AVM. I bet the hemorrhoids caused the AVM. They can be treated with rubber banding if necessary but you would need to hold warfarin again so I suggest we wait and see.  The other finding was diverticulosis - thickened folds and pockets in the colon. Common and not usually a problem.  I do not think you will need other colonoscopy or testing for blood in stool (please do not do this again unless there is a specific reason like evaluation of a problem as opposed to routine prevention - you have "aged out")  I appreciate the opportunity to care for you. Iva Boop, MD, [facility]   Discharge instructions given with verbal understanding. Handouts on polyps,diverticulosis and hemorrhoids given. Resume previous medications. Avoid ASPIRIN AND NSAIDS. YOU HAD AN ENDOSCOPIC PROCEDURE TODAY AT THE [facility]: Refer to the procedure report that was given to you for any specific questions about what was found during the examination.  If the procedure report does not answer your questions, please call your gastroenterologist to clarify.  If you requested that your care partner not be given the details of your procedure findings, then the procedure report has been included in a sealed envelope for you to review at your convenience later.  YOU SHOULD EXPECT: Some feelings of bloating in the abdomen. Passage of more gas than usual.  Walking can help get rid of the air that was put into your GI tract during the procedure and reduce the bloating. If you had a lower endoscopy (such as a colonoscopy or flexible sigmoidoscopy) you may notice spotting of blood in your stool or on the toilet paper. If you underwent a bowel prep for your procedure, then you may not have a normal bowel movement for a few days.  DIET: Your first meal following the procedure should be a light meal and then it is ok to  progress to your normal diet.  A half-sandwich or bowl of soup is an example of a good first meal.  Heavy or fried foods are harder to digest and may make you feel nauseous or bloated.  Likewise meals heavy in dairy and vegetables can cause extra gas to form and this can also increase the bloating.  Drink plenty of fluids but you should avoid alcoholic beverages for 24 hours.  ACTIVITY: Your care partner should take you home directly after the procedure.  You should plan to take it easy, moving slowly for the rest of the day.  You can resume normal activity the day after the procedure however you should NOT DRIVE or use heavy machinery for 24 hours (because of the sedation medicines used during the test).    SYMPTOMS TO REPORT IMMEDIATELY: A gastroenterologist can be reached at any hour.  During normal business hours, 8:30 AM to 5:00 PM Monday through Friday, call [phone].  After hours and on weekends, please call the GI answering service at [phone] who will take a message and have the physician on call contact you.   Following lower endoscopy (colonoscopy or flexible sigmoidoscopy):  Excessive amounts of blood in the stool  Significant tenderness or worsening of abdominal pains  Swelling of the abdomen that is new, acute  Fever of 100F or higher FOLLOW UP: If any biopsies were taken you will be contacted by phone or by letter within the next 1-3 weeks.  Call your  gastroenterologist if you have not heard about the biopsies in 3 weeks.  Our staff will call the home number listed on your records the next business day following your procedure to check on you and address any questions or concerns that you may have at that time regarding the information given to you following your procedure. This is a courtesy call and so if there is no answer at the home number and we have not heard from you through the emergency physician on call, we will assume that you have returned to your regular daily  activities without incident.  SIGNATURES/CONFIDENTIALITY: You and/or your care partner have signed paperwork which will be entered into your electronic medical record.  These signatures attest to the fact that that the information above on your After Visit Summary has been reviewed and is understood.  Full responsibility of the confidentiality of this discharge information lies with you and/or your care-partner.

## 2013-03-27 NOTE — Op Note (Signed)
 [facility] [address] [place], [address]   COLONOSCOPY PROCEDURE REPORT  PATIENT: Erica Bridges, Erica Bridges  MR#: [id number] BIRTHDATE: 30-Jun-1927 , 85  yrs. old GENDER: Female ENDOSCOPIST: Iva Boop, MD, [facility] PROCEDURE DATE:  03/27/2013 PROCEDURE:   Colonoscopy with snare polypectomy ASA CLASS:   Class III INDICATIONS:heme-positive stool. MEDICATIONS: propofol (Diprivan) 150mg  IV, MAC sedation, administered by CRNA, and These medications were titrated to patient response per physician's verbal order  DESCRIPTION OF PROCEDURE:   After the risks benefits and alternatives of the procedure were thoroughly explained, informed consent was obtained.  A digital rectal exam revealed no abnormalities of the rectum.   The LB [id number]  endoscope was introduced through the anus and advanced to the cecum, which was identified by both the appendix and ileocecal valve. No adverse events experienced.   The quality of the prep was excellent using Suprep  The instrument was then slowly withdrawn as the colon was fully examined.      COLON FINDINGS: A polypoid shaped sessile polyp measuring 1 cm in size was found at the cecum.  A polypectomy was performed using snare cautery.  The resection was complete and the polyp tissue was completely retrieved.   A diminutive sessile polyp was found at the splenic flexure.  A polypectomy was performed with a cold snare. The resection was complete and the polyp tissue was completely retrieved.   Small angiodysplastic lesion was found at the cecum. Moderate diverticulosis was noted in the sigmoid colon.   Large internal hemorrhoids were found.  Retroflexed views revealed internal hemorrhoids. The time to cecum=2 minutes 0 seconds. Withdrawal time=11 minutes 48 seconds.  The scope was withdrawn and the procedure completed. COMPLICATIONS: There were no complications.   ENDOSCOPIC IMPRESSION: 1.   Sessile polyp measuring 1 cm in  size was found at the cecum; polypectomy was performed using snare cautery 2.   Diminutive sessile polyp was found at the splenic flexure; polypectomy was performed with a cold snare 3.   Small angiodysplastic lesion and at the cecum 4.   Moderate diverticulosis was noted in the sigmoid colon 5.   Large internal hemorrhoids  RECOMMENDATIONS: Resume warfarin, avoid ASA/NSAID's Avoid future routine hemoccults will notify re: pathology Likely follow-up GI as needed   eSigned:  Iva Boop, MD, [facility] 03/27/2013 3:47 PM   cc: The Patient and Eustaquio Boyden MD   PATIENT NAME:  Erica Bridges, Erica Bridges MR#: [id number]

## 2013-03-27 NOTE — Progress Notes (Signed)
Pt denies any allergies to eggs or soy. 

## 2013-03-27 NOTE — Progress Notes (Signed)
Called to room to assist during endoscopic procedure.  Patient ID and intended procedure confirmed with present staff. Received instructions for my participation in the procedure from the performing physician.  

## 2013-03-27 NOTE — Progress Notes (Signed)
Patient did not experience any of the following events: a burn prior to discharge; a fall within the facility; wrong site/side/patient/procedure/implant event; or a hospital transfer or hospital admission upon discharge from the facility. (G8907) Patient did not have preoperative order for IV antibiotic SSI prophylaxis. (G8918)  

## 2013-03-28 ENCOUNTER — Telehealth: Payer: Self-pay

## 2013-03-28 NOTE — Telephone Encounter (Signed)
Unable to leave message no answering machine. 

## 2013-03-30 ENCOUNTER — Ambulatory Visit (INDEPENDENT_AMBULATORY_CARE_PROVIDER_SITE_OTHER): Payer: Medicare Other | Admitting: Family Medicine

## 2013-03-30 DIAGNOSIS — Z5181 Encounter for therapeutic drug level monitoring: Secondary | ICD-10-CM

## 2013-03-30 DIAGNOSIS — Z7901 Long term (current) use of anticoagulants: Secondary | ICD-10-CM | POA: Diagnosis not present

## 2013-03-30 DIAGNOSIS — I4891 Unspecified atrial fibrillation: Secondary | ICD-10-CM

## 2013-03-30 LAB — POCT INR: INR: 1.2

## 2013-04-04 ENCOUNTER — Encounter: Payer: Self-pay | Admitting: Internal Medicine

## 2013-04-04 NOTE — Progress Notes (Signed)
Quick Note:  ssa and adenoma No recall due to age ______

## 2013-04-05 ENCOUNTER — Encounter: Payer: Self-pay | Admitting: Family Medicine

## 2013-04-17 ENCOUNTER — Ambulatory Visit: Payer: Medicare Other | Attending: Gynecology | Admitting: Gynecology

## 2013-04-17 ENCOUNTER — Encounter: Payer: Self-pay | Admitting: Gynecology

## 2013-04-17 VITALS — BP 120/60 | HR 64 | Temp 98.0°F | Resp 20 | Ht 67.87 in | Wt 165.4 lb

## 2013-04-17 DIAGNOSIS — C549 Malignant neoplasm of corpus uteri, unspecified: Secondary | ICD-10-CM | POA: Diagnosis not present

## 2013-04-17 DIAGNOSIS — Z90710 Acquired absence of both cervix and uterus: Secondary | ICD-10-CM | POA: Diagnosis not present

## 2013-04-17 DIAGNOSIS — Z09 Encounter for follow-up examination after completed treatment for conditions other than malignant neoplasm: Secondary | ICD-10-CM | POA: Diagnosis not present

## 2013-04-17 DIAGNOSIS — Z923 Personal history of irradiation: Secondary | ICD-10-CM | POA: Diagnosis not present

## 2013-04-17 DIAGNOSIS — Z9079 Acquired absence of other genital organ(s): Secondary | ICD-10-CM | POA: Insufficient documentation

## 2013-04-17 DIAGNOSIS — E785 Hyperlipidemia, unspecified: Secondary | ICD-10-CM | POA: Insufficient documentation

## 2013-04-17 DIAGNOSIS — Z7901 Long term (current) use of anticoagulants: Secondary | ICD-10-CM | POA: Diagnosis not present

## 2013-04-17 DIAGNOSIS — I10 Essential (primary) hypertension: Secondary | ICD-10-CM | POA: Diagnosis not present

## 2013-04-17 DIAGNOSIS — I4891 Unspecified atrial fibrillation: Secondary | ICD-10-CM | POA: Insufficient documentation

## 2013-04-17 DIAGNOSIS — C55 Malignant neoplasm of uterus, part unspecified: Secondary | ICD-10-CM

## 2013-04-17 DIAGNOSIS — Z8542 Personal history of malignant neoplasm of other parts of uterus: Secondary | ICD-10-CM | POA: Insufficient documentation

## 2013-04-17 NOTE — Progress Notes (Signed)
Consult Note: Gyn-Onc   Erica Bridges 79 y.o. female  Chief Complaint  Patient presents with  . Uterine ca    New Consult    Assessment : Remote history of endometrial cancer status post surgery and radiation therapy. Patient's clinically free of disease  Plan: The patient had a Pap smear obtained on June 27 by Dr. Lily Peer which was normal. She's given the options for followup emboli to see [place] in one year.     HPI: 79 year old white married female seen upon referral from Dr. Reynaldo Minium regarding followup of endometrial cancer. Apparently the patient had endometrial cancer approximately 9 years ago. Details of her prior therapy are not available although she says she has surgery and radiation therapy but no chemotherapy. She is apparently been followed since that time with no evidence recurrent disease  Currently the patient denies any GI or GU symptoms she has no pelvic pain pressure vaginal bleeding or discharge. Review of Systems:10 point review of systems is negative except as noted in interval history.   Vitals: Blood pressure 120/60, pulse 64, temperature 98 F (36.7 C), temperature source Oral, resp. rate 20, height 5' 7.87" (1.724 m), weight 165 lb 6.4 oz (75.025 kg).  Physical Exam: General : The patient is a healthy woman in no acute distress.  HEENT: normocephalic, extraoccular movements normal; neck is supple without thyromegally  Lynphnodes: Supraclavicular and inguinal nodes not enlarged  Abdomen: Soft, non-tender, no ascites, no organomegally, no masses, no hernias  Pelvic:  EGBUS: Normal female  Vagina: Atrophic and foreshortened to a depth of approximately 3 cm. Urethra and Bladder: Normal, non-tender  Cervix: Surgically absent  Uterus: Surgically absent  Bi-manual examination: Non-tender; no adenxal masses or nodularity  Rectal: normal sphincter tone, no masses, no blood  Lower extremities: No edema or varicosities. Normal range of motion       Allergies  Allergen Reactions  . Amoxicillin     REACTION: rashj  . Penicillins     REACTION: rash    Past Medical History  Diagnosis Date  . Atrial fibrillation   . Malignant neoplasm of corpus uteri, except isthmus     followed by Roma Kayser with yearly visits  . HLD (hyperlipidemia)   . HTN (hypertension)   . Onychia and paronychia of toe   . Asthma   . Polycythemia 11/2011    normal EPO and periph smear  . Benign neoplasm of colon 10/22/2005    Hyperplastic  & Adenomatous polyps   . External hemorrhoids without mention of complication   . Diverticulosis of colon (without mention of hemorrhage)   . Pneumonia     Past Surgical History  Procedure Laterality Date  . Cataract extraction Bilateral   . Total abdominal hysterectomy w/ bilateral salpingoophorectomy  2005    uterine adenocarcinoma  . Colonoscopy  10/22/05    polyps-bx negative; divertics, ext hemmorhoids  . Lumbar disc surgery  1970s  . Refractive surgery Bilateral   . Tonsillectomy    . Colonoscopy  03/2013    tubular adenomas, small angiodysplastic lesion at cecum, mod diverticulosis Erica Bridges)    Current Outpatient Prescriptions  Medication Sig Dispense Refill  . acetaminophen (TYLENOL) 650 MG CR tablet Take 650 mg by mouth every 8 (eight) hours as needed.      Marland Kitchen atenolol (TENORMIN) 50 MG tablet TAKE 1 TABLET BY MOUTH ONCE A DAY  90 tablet  3  . Calcium Carbonate-Vitamin D (CALCIUM 600+D) 600-400 MG-UNIT per tablet Take 1 tablet by mouth daily.       Marland Kitchen  cetirizine (ZYRTEC) 10 MG tablet Take 10 mg by mouth daily as needed.      . cholecalciferol (VITAMIN D) 1000 UNITS tablet Take 1,000 Units by mouth daily.      . Coenzyme Q10 200 MG capsule Take 200 mg by mouth daily.      Marland Kitchen KLOR-CON M10 10 MEQ tablet TAKE 1 TABLET BY MOUTH ONCE A DAY  90 tablet  3  . nystatin-triamcinolone (MYCOLOG II) cream Apply qhs for 30 days  30 g  2  . Polyethyl Glycol-Propyl Glycol (SYSTANE) 0.4-0.3 % SOLN Apply 1 drop to eye 4  (four) times daily.      . potassium chloride (K-DUR) 10 MEQ tablet TAKE 1 TABLET BY MOUTH ONCE A DAY  90 tablet  3  . pravastatin (PRAVACHOL) 40 MG tablet Take 1 tablet (40 mg total) by mouth daily.  90 tablet  3  . triamterene-hydrochlorothiazide (MAXZIDE) 75-50 MG per tablet TAKE 1 TABLET BY MOUTH ONCE A DAY  90 tablet  3  . warfarin (COUMADIN) 5 MG tablet TAKE AS DIRECTED  100 tablet  3   No current facility-administered medications for this visit.    History   Social History  . Marital Status: Married    Spouse Name: N/A    Number of Children: 3  . Years of Education: N/A   Occupational History  . Retired Catering manager    Social History Main Topics  . Smoking status: Former Smoker    Types: Cigarettes    Quit date: 09/21/1982  . Smokeless tobacco: Never Used  . Alcohol Use: No  . Drug Use: No  . Sexually Active: Not on file   Other Topics Concern  . Not on file   Social History Narrative   Married, one son to daughters   Retired   Daily caffeine 1+    Family History  Problem Relation Age of Onset  . Hypertension Father   . Stroke Father   . Diabetes Mother   . Coronary artery disease Mother   . Hypertension Mother   . Colon cancer Neg Hx       CLARKE-PEARSON,Prabhjot Maddux L, MD 04/17/2013, 2:10 PM

## 2013-04-17 NOTE — Patient Instructions (Signed)
Plan to return to see [place] in one year.

## 2013-04-20 ENCOUNTER — Ambulatory Visit (INDEPENDENT_AMBULATORY_CARE_PROVIDER_SITE_OTHER): Payer: Medicare Other | Admitting: Family Medicine

## 2013-04-20 DIAGNOSIS — Z7901 Long term (current) use of anticoagulants: Secondary | ICD-10-CM | POA: Diagnosis not present

## 2013-04-20 DIAGNOSIS — I4891 Unspecified atrial fibrillation: Secondary | ICD-10-CM

## 2013-04-20 DIAGNOSIS — Z5181 Encounter for therapeutic drug level monitoring: Secondary | ICD-10-CM | POA: Diagnosis not present

## 2013-04-20 LAB — POCT INR: INR: 4.1

## 2013-05-10 DIAGNOSIS — Z1231 Encounter for screening mammogram for malignant neoplasm of breast: Secondary | ICD-10-CM | POA: Diagnosis not present

## 2013-05-11 ENCOUNTER — Encounter: Payer: Self-pay | Admitting: Family Medicine

## 2013-05-18 ENCOUNTER — Ambulatory Visit (INDEPENDENT_AMBULATORY_CARE_PROVIDER_SITE_OTHER): Payer: Medicare Other | Admitting: Family Medicine

## 2013-05-18 DIAGNOSIS — Z5181 Encounter for therapeutic drug level monitoring: Secondary | ICD-10-CM | POA: Diagnosis not present

## 2013-05-18 DIAGNOSIS — I4891 Unspecified atrial fibrillation: Secondary | ICD-10-CM

## 2013-05-18 DIAGNOSIS — Z7901 Long term (current) use of anticoagulants: Secondary | ICD-10-CM | POA: Diagnosis not present

## 2013-06-15 ENCOUNTER — Ambulatory Visit (INDEPENDENT_AMBULATORY_CARE_PROVIDER_SITE_OTHER): Payer: Medicare Other | Admitting: Family Medicine

## 2013-06-15 DIAGNOSIS — I4891 Unspecified atrial fibrillation: Secondary | ICD-10-CM | POA: Diagnosis not present

## 2013-06-15 DIAGNOSIS — Z7901 Long term (current) use of anticoagulants: Secondary | ICD-10-CM

## 2013-06-15 DIAGNOSIS — Z5181 Encounter for therapeutic drug level monitoring: Secondary | ICD-10-CM

## 2013-06-15 LAB — POCT INR: INR: 3

## 2013-07-13 ENCOUNTER — Ambulatory Visit (INDEPENDENT_AMBULATORY_CARE_PROVIDER_SITE_OTHER): Payer: Medicare Other | Admitting: Family Medicine

## 2013-07-13 ENCOUNTER — Ambulatory Visit: Payer: Medicare Other

## 2013-07-13 DIAGNOSIS — Z7901 Long term (current) use of anticoagulants: Secondary | ICD-10-CM | POA: Diagnosis not present

## 2013-07-13 DIAGNOSIS — I4891 Unspecified atrial fibrillation: Secondary | ICD-10-CM

## 2013-07-13 DIAGNOSIS — Z5181 Encounter for therapeutic drug level monitoring: Secondary | ICD-10-CM

## 2013-07-13 LAB — POCT INR: INR: 2.4

## 2013-07-18 ENCOUNTER — Encounter: Payer: Self-pay | Admitting: Gynecology

## 2013-07-18 ENCOUNTER — Ambulatory Visit (INDEPENDENT_AMBULATORY_CARE_PROVIDER_SITE_OTHER): Payer: Medicare Other | Admitting: Gynecology

## 2013-07-18 VITALS — BP 108/70 | Ht 68.25 in | Wt 167.0 lb

## 2013-07-18 DIAGNOSIS — N952 Postmenopausal atrophic vaginitis: Secondary | ICD-10-CM | POA: Diagnosis not present

## 2013-07-18 DIAGNOSIS — Z780 Asymptomatic menopausal state: Secondary | ICD-10-CM

## 2013-07-18 DIAGNOSIS — Z8542 Personal history of malignant neoplasm of other parts of uterus: Secondary | ICD-10-CM

## 2013-07-18 NOTE — Progress Notes (Signed)
Erica Bridges 21-Jul-1927 [id number]   History:    79 y.o.  for GYN exam followup. I had seen the patient for the first time in June of this year as a referral from her gastroenterologist Dr. Leone Payor.Patient has a past history of a radical hysterectomy and radiation therapy for uterine cancer (stage?) She was being followed by medical oncology group at [facility] and was scheduled to followup in August of this year but her medical oncologist past moved. She had a normal Pap smear in June of this year and was referred to our GYN oncologist at [facility] Dr. Kerri Perches who saw her this year as well. Patient's Pap smear was normal in 2006 before her radical hysterectomy. She had a colonoscopy this year whereby benign polyps were removed. She stated that several years prior to that she had additional polyps were removed. Her mammogram was reportedly normal this year. Her bone density study has been over 2 years. Dr. Eustaquio Boyden is her primary physician who is treating her for hyperlipidemia and hypertension.  Dr. Marvia Pickles Pearson's assessment was:"Remote history of endometrial cancer status post surgery and radiation therapy. 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   Past medical history,surgical history, family history and social history were all reviewed and documented in the EPIC chart.  Gynecologic History No LMP recorded. Patient has had a hysterectomy. Contraception: status post hysterectomy Last Pap: 2014. Results were: normal Last mammogram: 2014. Results were: normal  Obstetric History OB History  Gravida Para Term Preterm AB SAB TAB Ectopic Multiple Living  4 3   1 1    3     # Outcome Date GA Lbr Len/2nd Weight Sex Delivery Anes PTL Lv  4 SAB           3 PAR           2 PAR           1 PAR                ROS: A ROS was performed and pertinent positives and negatives are included in the history.   GENERAL: No fevers or chills. HEENT: No change in vision, no earache, sore throat or sinus congestion. NECK: No pain or stiffness. CARDIOVASCULAR: No chest pain or pressure. No palpitations. PULMONARY: No shortness of breath, cough or wheeze. GASTROINTESTINAL: No abdominal pain, nausea, vomiting or diarrhea, melena or bright red blood per rectum. GENITOURINARY: No urinary frequency, urgency, hesitancy or dysuria. MUSCULOSKELETAL: No joint or muscle pain, no back pain, no recent trauma. DERMATOLOGIC: No rash, no itching, no lesions. ENDOCRINE: No polyuria, polydipsia, no heat or cold intolerance. No recent change in weight. HEMATOLOGICAL: No anemia or easy bruising or bleeding. NEUROLOGIC: No headache, seizures, numbness, tingling or weakness. PSYCHIATRIC: No depression, no loss of interest in normal activity or change in sleep pattern.     Exam: chaperone present  BP 108/70  Ht 5' 8.25" (1.734 m)  Wt 167 lb (75.751 kg)  BMI 25.19 kg/m2  Body mass index is 25.19 kg/(m^2).  General appearance : Well developed well nourished female. No acute distress HEENT: Neck supple, trachea midline, no carotid bruits, no thyroidmegaly Lungs: Clear to auscultation, no rhonchi or wheezes, or rib retractions  Heart: Regular rate and rhythm, no murmurs or gallops Breast:Examined in sitting and supine position were symmetrical in appearance, no palpable masses or tenderness,  no skin retraction, no nipple inversion, no nipple  discharge, no skin discoloration, no axillary or supraclavicular lymphadenopathy Abdomen: no palpable masses or tenderness, no rebound or guarding Extremities: no edema or skin discoloration or tenderness  Pelvic:  Bartholin, Urethra, Skene Glands: Within normal limits             Vagina: No gross lesions or discharge  Cervix: absent  Uterus Absent  Adnexa  Without masses or tenderness  Anus and perineum  normal   Rectovaginal  normal sphincter tone without palpated masses or  tenderness             Hemoccult Colonoscopy this year     Assessment/Plan:  79 y.o. female with a remote history of endometrial cancer post surgery and radiation with no evidence of recurrent disease doing well. Patient will be scheduled for a bone density study here in our office in the next few weeks. She was given a requisition to obtain her shingles vaccine. We discussed importance of calcium vitamin D and maintaining good activity to prevent osteoporosis. Pap smear done 6 months ago. Patient's other vaccines are up-to-date including flu vaccine which she received last week.   Note: This dictation was prepared with  Dragon/digital dictation along withSmart phrase technology. Any transcriptional errors that result from this process are unintentional.   Ok Edwards MD, 2:58 PM 07/18/2013

## 2013-07-18 NOTE — Patient Instructions (Signed)
Bone Densitometry Bone densitometry is a special X-ray that measures your bone density and can be used to help predict your risk of bone fractures. This test is used to determine bone mineral content and density to diagnose osteoporosis. Osteoporosis is the loss of bone that may cause the bone to become weak. Osteoporosis commonly occurs in women entering menopause. However, it may be found in men and in people with other diseases. PREPARATION FOR TEST No preparation necessary. WHO SHOULD BE TESTED?  All women older than 65.  Postmenopausal women (50 to 65) with risk factors for osteoporosis.  People with a previous fracture caused by normal activities.  People with a small body frame (less than 127 poundsor a body mass index [BMI] of less than 21).  People who have a parent with a hip fracture or history of osteoporosis.  People who smoke.  People who have rheumatoid arthritis.  Anyone who engages in excessive alcohol use (more than 3 drinks most days).  Women who experience early menopause. WHEN SHOULD YOU BE RETESTED? Current guidelines suggest that you should wait at least 2 years before doing a bone density test again if your first test was normal.Recent studies indicated that women with normal bone density may be able to wait a few years before needing to repeat a bone density test. You should discuss this with your caregiver.  NORMAL FINDINGS   Normal: less than standard deviation below normal (greater than -1).  Osteopenia: 1 to 2.5 standard deviations below normal (-1 to -2.5).  Osteoporosis: greater than 2.5 standard deviations below normal (less than -2.5). Test results are reported as a "T score" and a "Z score."The T score is a number that compares your bone density with the bone density of healthy, young women.The Z score is a number that compares your bone density with the scores of women who are the same age, gender, and race.  Ranges for normal findings may vary  among different laboratories and hospitals. You should always check with your doctor after having lab work or other tests done to discuss the meaning of your test results and whether your values are considered within normal limits. MEANING OF TEST  Your caregiver will go over the test results with you and discuss the importance and meaning of your results, as well as treatment options and the need for additional tests if necessary. OBTAINING THE TEST RESULTS It is your responsibility to obtain your test results. Ask the lab or department performing the test when and how you will get your results. Document Released: 09/29/2004 Document Revised: 11/30/2011 Document Reviewed: 10/22/2010 [employer] Patient Information 2014 [employer].  

## 2013-08-10 ENCOUNTER — Ambulatory Visit: Payer: Medicare Other

## 2013-09-07 ENCOUNTER — Ambulatory Visit (INDEPENDENT_AMBULATORY_CARE_PROVIDER_SITE_OTHER): Payer: Medicare Other

## 2013-09-07 ENCOUNTER — Other Ambulatory Visit: Payer: Self-pay | Admitting: Gynecology

## 2013-09-07 DIAGNOSIS — Z780 Asymptomatic menopausal state: Secondary | ICD-10-CM

## 2013-10-09 ENCOUNTER — Ambulatory Visit (INDEPENDENT_AMBULATORY_CARE_PROVIDER_SITE_OTHER): Payer: Medicare Other | Admitting: Family Medicine

## 2013-10-09 DIAGNOSIS — I4891 Unspecified atrial fibrillation: Secondary | ICD-10-CM | POA: Diagnosis not present

## 2013-10-09 DIAGNOSIS — Z7901 Long term (current) use of anticoagulants: Secondary | ICD-10-CM | POA: Diagnosis not present

## 2013-10-09 DIAGNOSIS — Z5181 Encounter for therapeutic drug level monitoring: Secondary | ICD-10-CM | POA: Diagnosis not present

## 2013-10-09 LAB — POCT INR: INR: 2.9

## 2013-10-24 ENCOUNTER — Telehealth: Payer: Self-pay | Admitting: *Deleted

## 2013-10-24 NOTE — Telephone Encounter (Signed)
Patient called and said she has to have 2 teeth extracted and needs to come off coumadin. However the dentist was telling her about a new test (INR value?) that she could have done to hopefully keep her from having to be off of the coumadin x 5 days as in the past. Are you familiar with this test and if so, can she have it done? She hasn't scheduled the extractions yet based on this.

## 2013-10-25 NOTE — Telephone Encounter (Signed)
Message left with female to return my call.  

## 2013-10-25 NOTE — Telephone Encounter (Signed)
I'm not aware of a specific test to determine bleeding risk, but if dentist feels comfortable with her INR level prior to procedure, may be able to avoid coming off coumadin.  There is also topical mouthwash that can help decrease risk as well - I assume this would be prescribed by dentist.  Can she get [place] # of dentist to touch base with them?  CHADS2 = 2 Anticoagulant indication - afib.

## 2013-10-26 NOTE — Telephone Encounter (Signed)
Spoke with patient. Dentist is Dr. Gilman Schmidt in [place]. Contact number is [phone]. Did you want me to call or did you want to talk to him?

## 2013-11-10 NOTE — Telephone Encounter (Signed)
Yesterday placed phone call in to Dr. Gilman Schmidt, awaiting return call.

## 2013-11-20 ENCOUNTER — Ambulatory Visit (INDEPENDENT_AMBULATORY_CARE_PROVIDER_SITE_OTHER): Payer: Medicare Other | Admitting: Family Medicine

## 2013-11-20 DIAGNOSIS — Z5181 Encounter for therapeutic drug level monitoring: Secondary | ICD-10-CM

## 2013-11-20 DIAGNOSIS — I4891 Unspecified atrial fibrillation: Secondary | ICD-10-CM

## 2013-11-20 DIAGNOSIS — Z7901 Long term (current) use of anticoagulants: Secondary | ICD-10-CM

## 2013-11-20 LAB — POCT INR: INR: 2

## 2013-11-22 NOTE — Telephone Encounter (Signed)
Pt has to reschedule appt with Dr. Atilano Median office.  She is going to call me as soon as she gets another appt so that I can check INR prior to procedure.

## 2013-11-22 NOTE — Telephone Encounter (Signed)
CHADS2VASC score = 4 (9% risk stroke).  Finally able to reach Dr. Gilman Schmidt - he would like low INR value close to 2.0 prior to procedure.  plz notify patient - she may schedule tooth extraction and come in 1-3 days prior to procedure for INR check and then we will fax INR value to Dr. Gilman Schmidt.  If INR >2.4, would hold coumadin for 1-2 days.  Dr. Atilano Median Fax # [phone].

## 2014-01-01 ENCOUNTER — Ambulatory Visit (INDEPENDENT_AMBULATORY_CARE_PROVIDER_SITE_OTHER): Payer: Medicare Other | Admitting: Family Medicine

## 2014-01-01 ENCOUNTER — Encounter: Payer: Self-pay | Admitting: Family Medicine

## 2014-01-01 VITALS — BP 134/78 | HR 76 | Temp 97.7°F | Wt 164.0 lb

## 2014-01-01 DIAGNOSIS — Z5181 Encounter for therapeutic drug level monitoring: Secondary | ICD-10-CM | POA: Diagnosis not present

## 2014-01-01 DIAGNOSIS — Z7901 Long term (current) use of anticoagulants: Secondary | ICD-10-CM

## 2014-01-01 DIAGNOSIS — S70369A Insect bite (nonvenomous), unspecified thigh, initial encounter: Secondary | ICD-10-CM | POA: Insufficient documentation

## 2014-01-01 DIAGNOSIS — S90569A Insect bite (nonvenomous), unspecified ankle, initial encounter: Secondary | ICD-10-CM | POA: Diagnosis not present

## 2014-01-01 DIAGNOSIS — I4891 Unspecified atrial fibrillation: Secondary | ICD-10-CM

## 2014-01-01 DIAGNOSIS — W57XXXA Bitten or stung by nonvenomous insect and other nonvenomous arthropods, initial encounter: Principal | ICD-10-CM

## 2014-01-01 LAB — POCT INR: INR: 2.4

## 2014-01-01 MED ORDER — DOXYCYCLINE HYCLATE 100 MG PO CAPS: 100.0000 mg | ORAL_CAPSULE | Freq: Two times a day (BID) | ORAL | Status: DC

## 2014-01-01 NOTE — Assessment & Plan Note (Addendum)
Tick attached for >3 days.  Now about 7 days since tick bite.  I inspected residual nodule from tick bite - no evidence of further tick pieces. Given duration of attachment provided with WASP for doxycycline 10d course with indications on when to start treatment. Stressed importance of calling us to titrate coumadin if she starts antibiotic. Pt agrees with plan. Pt education on tick bites and prevention provided today.

## 2014-01-01 NOTE — Patient Instructions (Signed)
No residual tick pieces in skin today. However that tick was on you for a good amount of time - watch over the next wek for any fever, new rash, worsening headache, joint pains, or abdominal pain or nausea.  If this happens, fill doxycycline antibiotic and take (doxycycline will increase risk of sunburns). If you start taking doxycycline, let us know to titrate your coumadin levels.  Tick Bite Information Ticks are insects that attach themselves to the skin and draw blood for food. There are various types of ticks. Common types include wood ticks and deer ticks. Most ticks live in shrubs and grassy areas. Ticks can climb onto your body when you make contact with leaves or grass where the tick is waiting. The most common places on the body for ticks to attach themselves are the scalp, neck, armpits, waist, and groin. Most tick bites are harmless, but sometimes ticks carry germs that cause diseases. These germs can be spread to a person during the tick's feeding process. The chance of a disease spreading through a tick bite depends on:   The type of tick.  Time of year.   How long the tick is attached.   Geographic location.  HOW CAN YOU PREVENT TICK BITES? Take these steps to help prevent tick bites when you are outdoors:  Wear protective clothing. Long sleeves and long pants are best.   Wear white clothes so you can see ticks more easily.  Tuck your pant legs into your socks.   If walking on a trail, stay in the middle of the trail to avoid brushing against bushes.  Avoid walking through areas with long grass.  Put insect repellent on all exposed skin and along boot tops, pant legs, and sleeve cuffs.   Check clothing, hair, and skin repeatedly and before going inside.   Brush off any ticks that are not attached.  Take a shower or bath as soon as possible after being outdoors.  WHAT IS THE PROPER WAY TO REMOVE A TICK? Ticks should be removed as soon as possible to help  prevent diseases caused by tick bites. 1. If latex gloves are available, put them on before trying to remove a tick.  2. Using fine-point tweezers, grasp the tick as close to the skin as possible. You may also use curved forceps or a tick removal tool. Grasp the tick as close to its head as possible. Avoid grasping the tick on its body. 3. Pull gently with steady upward pressure until the tick lets go. Do not twist the tick or jerk it suddenly. This may break off the tick's head or mouth parts. 4. Do not squeeze or crush the tick's body. This could force disease-carrying fluids from the tick into your body.  5. After the tick is removed, wash the bite area and your hands with soap and water or other disinfectant such as alcohol. 6. Apply a small amount of antiseptic cream or ointment to the bite site.  7. Wash and disinfect any instruments that were used.  Do not try to remove a tick by applying a hot match, petroleum jelly, or fingernail polish to the tick. These methods do not work and may increase the chances of disease being spread from the tick bite.  WHEN SHOULD YOU SEEK MEDICAL CARE? Contact your health care provider if you are unable to remove a tick from your skin or if a part of the tick breaks off and is stuck in the skin.  After a tick  bite, you need to be aware of signs and symptoms that could be related to diseases spread by ticks. Contact your health care provider if you develop any of the following in the days or weeks after the tick bite:  Unexplained fever.  Rash. A circular rash that appears days or weeks after the tick bite may indicate the possibility of Lyme disease. The rash may resemble a target with a bull's-eye and may occur at a different part of your body than the tick bite.  Redness and swelling in the area of the tick bite.   Tender, swollen lymph glands.   Diarrhea.   Weight loss.   Cough.   Fatigue.   Muscle, joint, or bone pain.   Abdominal  pain.   Headache.   Lethargy or a change in your level of consciousness.  Difficulty walking or moving your legs.   Numbness in the legs.   Paralysis.  Shortness of breath.   Confusion.   Repeated vomiting.  Document Released: 09/04/2000 Document Revised: 06/28/2013 Document Reviewed: 02/15/2013 [employer] Patient Information 2014 [place].

## 2014-01-01 NOTE — Progress Notes (Signed)
   BP 134/78  Pulse 76  Temp(Src) 97.7 F (36.5 C) (Tympanic)  Wt 164 lb (74.39 kg)   CC: check tick bite  Subjective:    Patient ID: Erica Bridges, female    DOB: May 02, 1927, 80 y.o.   MRN: [id number]  HPI: Erica Bridges is a 80 y.o. female presenting on 01/01/2014 for Tick Removal   About 2 weeks ago woke up in middle of night with pruritis.  It was on her for about 1 week.  Finally removed tick with tweezers on Thursday 12/28/2013.  No fevers/chills, new rashes, headache, abd pain or nausea.  Relevant past medical, surgical, family and social history reviewed and updated as indicated.  Allergies and medications reviewed and updated. Current Outpatient Prescriptions on File Prior to Visit  Medication Sig  . acetaminophen (TYLENOL) 650 MG CR tablet Take 650 mg by mouth every 8 (eight) hours as needed.  Marland Kitchen atenolol (TENORMIN) 50 MG tablet TAKE 1 TABLET BY MOUTH ONCE A DAY  . Calcium Carbonate-Vitamin D (CALCIUM 600+D) 600-400 MG-UNIT per tablet Take 1 tablet by mouth daily.   . cetirizine (ZYRTEC) 10 MG tablet Take 10 mg by mouth daily as needed.  . cholecalciferol (VITAMIN D) 1000 UNITS tablet Take 1,000 Units by mouth daily.  . Coenzyme Q10 200 MG capsule Take 200 mg by mouth daily.  Marland Kitchen KLOR-CON M10 10 MEQ tablet TAKE 1 TABLET BY MOUTH ONCE A DAY  . Polyethyl Glycol-Propyl Glycol (SYSTANE) 0.4-0.3 % SOLN Apply 1 drop to eye 4 (four) times daily.  . pravastatin (PRAVACHOL) 40 MG tablet Take 1 tablet (40 mg total) by mouth daily.  Marland Kitchen triamterene-hydrochlorothiazide (MAXZIDE) 75-50 MG per tablet TAKE 1 TABLET BY MOUTH ONCE A DAY  . warfarin (COUMADIN) 5 MG tablet TAKE AS DIRECTED  . nystatin-triamcinolone (MYCOLOG II) cream Apply qhs for 30 days   No current facility-administered medications on file prior to visit.    Review of Systems Per HPI unless specifically indicated above    Objective:    BP 134/78  Pulse 76  Temp(Src) 97.7 F (36.5 C) (Tympanic)  Wt 164 lb (74.39 kg)    Physical Exam  Nursing note and vitals reviewed. Constitutional: She appears well-developed and well-nourished. No distress.  Skin: Skin is warm and dry. No rash noted. No erythema.  Left lateral thigh with small nodule with central punctum.  No foreign object identified.   Results for orders placed in visit on 11/20/13  POCT INR      Result Value Ref Range   INR 2.0        Assessment & Plan:   Problem List Items Addressed This Visit   Tick bite of thigh - Primary     Tick attached for >3 days.  Now about 7 days since tick bite.  I inspected residual nodule from tick bite - no evidence of further tick pieces. Given duration of attachment provided with WASP for doxycycline 10d course with indications on when to start treatment. Stressed importance of calling us to titrate coumadin if she starts antibiotic. Pt agrees with plan. Pt education on tick bites and prevention provided today.        Follow up plan: Return if symptoms worsen or fail to improve.

## 2014-01-01 NOTE — Progress Notes (Signed)
Pre visit review using our clinic review tool, if applicable. No additional management support is needed unless otherwise documented below in the visit note. 

## 2014-01-16 ENCOUNTER — Other Ambulatory Visit: Payer: Self-pay

## 2014-01-16 MED ORDER — POTASSIUM CHLORIDE CRYS ER 10 MEQ PO TBCR: EXTENDED_RELEASE_TABLET | ORAL | Status: DC

## 2014-01-16 MED ORDER — TRIAMTERENE-HCTZ 75-50 MG PO TABS: ORAL_TABLET | ORAL | Status: DC

## 2014-01-16 NOTE — Telephone Encounter (Signed)
Pt request refill Klorcon and triamterene HCTZ # 90 to [facility]. Pt has CPX scheduled 02/07/14. Advised pt done.

## 2014-01-19 ENCOUNTER — Other Ambulatory Visit: Payer: Self-pay | Admitting: Family Medicine

## 2014-01-30 ENCOUNTER — Other Ambulatory Visit: Payer: Self-pay | Admitting: Family Medicine

## 2014-01-30 DIAGNOSIS — E559 Vitamin D deficiency, unspecified: Secondary | ICD-10-CM

## 2014-01-30 DIAGNOSIS — D751 Secondary polycythemia: Secondary | ICD-10-CM

## 2014-01-30 DIAGNOSIS — I10 Essential (primary) hypertension: Secondary | ICD-10-CM

## 2014-01-30 DIAGNOSIS — N289 Disorder of kidney and ureter, unspecified: Secondary | ICD-10-CM

## 2014-01-30 DIAGNOSIS — E7800 Pure hypercholesterolemia, unspecified: Secondary | ICD-10-CM

## 2014-01-31 ENCOUNTER — Other Ambulatory Visit (INDEPENDENT_AMBULATORY_CARE_PROVIDER_SITE_OTHER): Payer: Medicare Other

## 2014-01-31 DIAGNOSIS — E7800 Pure hypercholesterolemia, unspecified: Secondary | ICD-10-CM

## 2014-01-31 DIAGNOSIS — E559 Vitamin D deficiency, unspecified: Secondary | ICD-10-CM | POA: Diagnosis not present

## 2014-01-31 DIAGNOSIS — I10 Essential (primary) hypertension: Secondary | ICD-10-CM

## 2014-01-31 DIAGNOSIS — D45 Polycythemia vera: Secondary | ICD-10-CM

## 2014-01-31 DIAGNOSIS — N289 Disorder of kidney and ureter, unspecified: Secondary | ICD-10-CM | POA: Diagnosis not present

## 2014-01-31 DIAGNOSIS — D751 Secondary polycythemia: Secondary | ICD-10-CM

## 2014-01-31 LAB — CBC WITH DIFFERENTIAL/PLATELET
Basophils Absolute: 0 10*3/uL (ref 0.0–0.1)
Basophils Relative: 0.6 % (ref 0.0–3.0)
EOS ABS: 0.3 10*3/uL (ref 0.0–0.7)
Eosinophils Relative: 4.5 % (ref 0.0–5.0)
HCT: 45 % (ref 36.0–46.0)
HEMOGLOBIN: 14.8 g/dL (ref 12.0–15.0)
Lymphocytes Relative: 20.4 % (ref 12.0–46.0)
Lymphs Abs: 1.4 10*3/uL (ref 0.7–4.0)
MCHC: 32.9 g/dL (ref 30.0–36.0)
MCV: 89.8 fl (ref 78.0–100.0)
MONO ABS: 0.8 10*3/uL (ref 0.1–1.0)
Monocytes Relative: 11.4 % (ref 3.0–12.0)
NEUTROS ABS: 4.4 10*3/uL (ref 1.4–7.7)
NEUTROS PCT: 63.1 % (ref 43.0–77.0)
Platelets: 180 10*3/uL (ref 150.0–400.0)
RBC: 5.01 Mil/uL (ref 3.87–5.11)
RDW: 13.6 % (ref 11.5–15.5)
WBC: 7 10*3/uL (ref 4.0–10.5)

## 2014-01-31 LAB — RENAL FUNCTION PANEL
Albumin: 3.9 g/dL (ref 3.5–5.2)
BUN: 23 mg/dL (ref 6–23)
CO2: 34 mEq/L — ABNORMAL HIGH (ref 19–32)
Calcium: 10 mg/dL (ref 8.4–10.5)
Chloride: 98 mEq/L (ref 96–112)
Creatinine, Ser: 1.1 mg/dL (ref 0.4–1.2)
GFR: 48.66 mL/min — ABNORMAL LOW (ref >60.00)
GLUCOSE: 84 mg/dL (ref 70–99)
Phosphorus: 3 mg/dL (ref 2.3–4.6)
Potassium: 3.2 mEq/L — ABNORMAL LOW (ref 3.5–5.1)
SODIUM: 139 meq/L (ref 135–145)

## 2014-01-31 LAB — LIPID PANEL
Cholesterol: 147 mg/dL (ref 0–200)
HDL: 71.7 mg/dL (ref >39.00)
LDL CALC: 55 mg/dL (ref 0–99)
Total CHOL/HDL Ratio: 2
Triglycerides: 103 mg/dL (ref 0.0–149.0)
VLDL: 20.6 mg/dL (ref 0.0–40.0)

## 2014-02-01 LAB — VITAMIN D 25 HYDROXY (VIT D DEFICIENCY, FRACTURES): Vit D, 25-Hydroxy: 48 ng/mL (ref 30–89)

## 2014-02-07 ENCOUNTER — Ambulatory Visit (INDEPENDENT_AMBULATORY_CARE_PROVIDER_SITE_OTHER): Payer: Medicare Other | Admitting: Family Medicine

## 2014-02-07 ENCOUNTER — Encounter: Payer: Self-pay | Admitting: Family Medicine

## 2014-02-07 VITALS — BP 136/84 | HR 88 | Temp 97.5°F | Ht 69.0 in | Wt 160.0 lb

## 2014-02-07 DIAGNOSIS — Z23 Encounter for immunization: Secondary | ICD-10-CM | POA: Diagnosis not present

## 2014-02-07 DIAGNOSIS — Z0000 Encounter for general adult medical examination without abnormal findings: Secondary | ICD-10-CM

## 2014-02-07 DIAGNOSIS — E7800 Pure hypercholesterolemia, unspecified: Secondary | ICD-10-CM

## 2014-02-07 DIAGNOSIS — I10 Essential (primary) hypertension: Secondary | ICD-10-CM | POA: Diagnosis not present

## 2014-02-07 DIAGNOSIS — R42 Dizziness and giddiness: Secondary | ICD-10-CM | POA: Diagnosis not present

## 2014-02-07 DIAGNOSIS — D45 Polycythemia vera: Secondary | ICD-10-CM

## 2014-02-07 DIAGNOSIS — D751 Secondary polycythemia: Secondary | ICD-10-CM

## 2014-02-07 NOTE — Assessment & Plan Note (Signed)
Chronic, stable. Continue maxzide. Recheck K today.

## 2014-02-07 NOTE — Progress Notes (Signed)
Pre visit review using our clinic review tool, if applicable. No additional management support is needed unless otherwise documented below in the visit note. 

## 2014-02-07 NOTE — Assessment & Plan Note (Signed)
I have personally reviewed the Medicare Annual Wellness questionnaire and have noted 1. The patient's medical and social history 2. Their use of alcohol, tobacco or illicit drugs 3. Their current medications and supplements 4. The patient's functional ability including ADL's, fall risks, home safety risks and hearing or visual impairment. 5. Diet and physical activity 6. Evidence for depression or mood disorders The patients weight, height, BMI have been recorded in the chart.  Hearing and vision has been addressed. I have made referrals, counseling and provided education to the patient based review of the above and I have provided the pt with a written personalized care plan for preventive services. See scanned questionairre. Advanced directives discussed: discussed discussing with her HCPOA who she has not chosen yet.  Reviewed preventative protocols and updated unless pt declined. Well woman with OBGYN (pap/pelvic/breast/mammo) prevnar today.

## 2014-02-07 NOTE — Progress Notes (Addendum)
BP 136/84  Pulse 88  Temp(Src) 97.5 F (36.4 C) (Oral)  Ht 5\' 9"  (1.753 m)  Wt 160 lb (72.576 kg)  BMI 23.62 kg/m2   CC: medicare wellness visit  Subjective:    Patient ID: Erica Bridges, female    DOB: 1927-07-15, 80 y.o.   MRN: [id number]  HPI: JAISA Bridges is a 80 y.o. female presenting on 02/07/2014 for Annual Exam   Pleasant 80 yo with h/o afib on coumadin, HTN, h/o endometrial cancer presents for medicare wellness visit. Takes care of husband who suffers from dementia and diabetes. Polycythemia - mild for years, has not seen heme in past. H/o remote uterine cancer followed by [facility] (oncologist Dr. Robert Bellow), told may return to [facility].  Passes vision screen.  Hearing - uses aides regularly.  2 falls in last year - "clumsy" some dizziness worse in the morning, gets better as day progresses. Denies depression/anhedonia. Discussed caregiver stress.   Preventative: COLONOSCOPY Date: 03/2013 tubular adenomas, small angiodysplastic lesion at cecum, mod diverticulosis Carlean Purl).   H/o uterine cancer s/p hysterectomy 2005.  Established with Dr. Toney Rakes and has seen cancer clinic. Overall stable.  DEXA - 2014 WNL. Discussed calcium and vit D use. Mammogram 04/2012, rpt due 04/2013. Pt sets this up herself.  Well woman with OBGYN. Flu shot - 06/2013 Td 2010  Pneumovax 2006. prevnar today. Has never had shingles shot - will look into insurance.  Advanced directive: HCPOA unsure - thinks son would be this.  Relevant past medical, surgical, family and social history reviewed and updated as indicated.  Allergies and medications reviewed and updated. Current Outpatient Prescriptions on File Prior to Visit  Medication Sig  . acetaminophen (TYLENOL) 650 MG CR tablet Take 650 mg by mouth every 8 (eight) hours as needed.  Marland Kitchen atenolol (TENORMIN) 50 MG tablet TAKE 1 TABLET BY MOUTH ONCE A DAY  . Calcium Carbonate-Vitamin D (CALCIUM 600+D) 600-400 MG-UNIT per tablet Take 2 tablets by  mouth daily.   . cetirizine (ZYRTEC) 10 MG tablet Take 10 mg by mouth daily as needed.  . cholecalciferol (VITAMIN D) 1000 UNITS tablet Take 1,000 Units by mouth daily.  . Coenzyme Q10 200 MG capsule Take 200 mg by mouth daily.  Vladimir Faster Glycol-Propyl Glycol (SYSTANE) 0.4-0.3 % SOLN Apply 1 drop to eye 4 (four) times daily as needed.   . pravastatin (PRAVACHOL) 40 MG tablet Take 1 tablet (40 mg total) by mouth daily.  Marland Kitchen triamterene-hydrochlorothiazide (MAXZIDE) 75-50 MG per tablet TAKE 1 TABLET BY MOUTH ONCE A DAY  . warfarin (COUMADIN) 5 MG tablet TAKE AS DIRECTED  . nystatin-triamcinolone (MYCOLOG II) cream Apply qhs for 30 days   No current facility-administered medications on file prior to visit.    Review of Systems Per HPI unless specifically indicated above    Objective:    BP 136/84  Pulse 88  Temp(Src) 97.5 F (36.4 C) (Oral)  Ht 5\' 9"  (1.753 m)  Wt 160 lb (72.576 kg)  BMI 23.62 kg/m2  Physical Exam  Nursing note and vitals reviewed. Constitutional: She is oriented to person, place, and time. She appears well-developed and well-nourished. No distress.  HENT:  Head: Normocephalic and atraumatic.  Right Ear: Hearing, tympanic membrane, external ear and ear canal normal.  Left Ear: Hearing, tympanic membrane, external ear and ear canal normal.  Nose: Nose normal.  Mouth/Throat: Uvula is midline, oropharynx is clear and moist and mucous membranes are normal. No oropharyngeal exudate, posterior oropharyngeal edema or posterior oropharyngeal  erythema.  Eyes: Conjunctivae and EOM are normal. Pupils are equal, round, and reactive to light. No scleral icterus.  Neck: Normal range of motion. Neck supple. Carotid bruit is not present. No thyromegaly present.  Cardiovascular: Normal rate, regular rhythm, normal heart sounds and intact distal pulses.   No murmur heard. Pulses:      Radial pulses are 2+ on the right side, and 2+ on the left side.  Pulmonary/Chest: Effort normal  and breath sounds normal. No respiratory distress. She has no wheezes. She has no rales.  Abdominal: Soft. Bowel sounds are normal. She exhibits no distension and no mass. There is no tenderness. There is no rebound and no guarding.  Musculoskeletal: Normal range of motion. She exhibits no edema.  Lymphadenopathy:    She has no cervical adenopathy.  Neurological: She is alert and oriented to person, place, and time.  CN grossly intact, station and gait intact Recall 3/3 Calculation: 5/5 serial 3s  Skin: Skin is warm and dry. No rash noted.  Psychiatric: She has a normal mood and affect. Her behavior is normal. Judgment and thought content normal.   Results for orders placed in visit on 01/31/14  RENAL FUNCTION PANEL      Result Value Ref Range   Sodium 139  135 - 145 mEq/L   Potassium 3.2 (*) 3.5 - 5.1 mEq/L   Chloride 98  96 - 112 mEq/L   CO2 34 (*) 19 - 32 mEq/L   Calcium 10.0  8.4 - 10.5 mg/dL   Albumin 3.9  3.5 - 5.2 g/dL   BUN 23  6 - 23 mg/dL   Creatinine, Ser 1.1  0.4 - 1.2 mg/dL   Glucose, Bld 84  70 - 99 mg/dL   Phosphorus 3.0  2.3 - 4.6 mg/dL   GFR 48.66 (*) >60.00 mL/min  LIPID PANEL      Result Value Ref Range   Cholesterol 147  0 - 200 mg/dL   Triglycerides 103.0  0.0 - 149.0 mg/dL   HDL 71.70  >39.00 mg/dL   VLDL 20.6  0.0 - 40.0 mg/dL   LDL Cholesterol 55  0 - 99 mg/dL   Total CHOL/HDL Ratio 2    VITAMIN D 25 HYDROXY      Result Value Ref Range   Vit D, 25-Hydroxy 48  30 - 89 ng/mL  CBC WITH DIFFERENTIAL      Result Value Ref Range   WBC 7.0  4.0 - 10.5 K/uL   RBC 5.01  3.87 - 5.11 Mil/uL   Hemoglobin 14.8  12.0 - 15.0 g/dL   HCT 45.0  36.0 - 46.0 %   MCV 89.8  78.0 - 100.0 fl   MCHC 32.9  30.0 - 36.0 g/dL   RDW 13.6  11.5 - 15.5 %   Platelets 180.0  150.0 - 400.0 K/uL   Neutrophils Relative % 63.1  43.0 - 77.0 %   Lymphocytes Relative 20.4  12.0 - 46.0 %   Monocytes Relative 11.4  3.0 - 12.0 %   Eosinophils Relative 4.5  0.0 - 5.0 %   Basophils  Relative 0.6  0.0 - 3.0 %   Neutro Abs 4.4  1.4 - 7.7 K/uL   Lymphs Abs 1.4  0.7 - 4.0 K/uL   Monocytes Absolute 0.8  0.1 - 1.0 K/uL   Eosinophils Absolute 0.3  0.0 - 0.7 K/uL   Basophils Absolute 0.0  0.0 - 0.1 K/uL      Assessment & Plan:  Orthostatic  vital signs normal today  Problem List Items Addressed This Visit   Polycythemia     seems resolved.    Medicare annual wellness visit, subsequent - Primary     I have personally reviewed the Medicare Annual Wellness questionnaire and have noted 1. The patient's medical and social history 2. Their use of alcohol, tobacco or illicit drugs 3. Their current medications and supplements 4. The patient's functional ability including ADL's, fall risks, home safety risks and hearing or visual impairment. 5. Diet and physical activity 6. Evidence for depression or mood disorders The patients weight, height, BMI have been recorded in the chart.  Hearing and vision has been addressed. I have made referrals, counseling and provided education to the patient based review of the above and I have provided the pt with a written personalized care plan for preventive services. See scanned questionairre. Advanced directives discussed: discussed discussing with her HCPOA who she has not chosen yet.  Reviewed preventative protocols and updated unless pt declined. Well woman with OBGYN (pap/pelvic/breast/mammo) prevnar today.    HYPERTENSION     Chronic, stable. Continue maxzide. Recheck K today.    HYPERCHOLESTEROLEMIA     Chronic, stable. Continue pravastatin 40mg .    Dizziness     Endorsed dizziness with propensity for falls - orthostatic vital signs normal today - will continue to monitor.     Other Visit Diagnoses   Immunization due        Relevant Orders       Pneumococcal conjugate vaccine 13-valent (Completed)        Follow up plan: Return in about 1 year (around 02/08/2015), or as needed, for annual exam, prior fasting for blood  work.

## 2014-02-07 NOTE — Assessment & Plan Note (Signed)
Chronic, stable. Continue pravastatin 40mg .

## 2014-02-07 NOTE — Assessment & Plan Note (Signed)
seems resolved.

## 2014-02-07 NOTE — Patient Instructions (Addendum)
prevnar today. Good to see you today, call us with questions. Return as needed or in 1 year for next wellness exam. 

## 2014-02-07 NOTE — Assessment & Plan Note (Signed)
Endorsed dizziness with propensity for falls - orthostatic vital signs normal today - will continue to monitor.

## 2014-02-15 ENCOUNTER — Ambulatory Visit (INDEPENDENT_AMBULATORY_CARE_PROVIDER_SITE_OTHER): Payer: Medicare Other | Admitting: Family Medicine

## 2014-02-15 DIAGNOSIS — Z5181 Encounter for therapeutic drug level monitoring: Secondary | ICD-10-CM | POA: Diagnosis not present

## 2014-02-15 DIAGNOSIS — Z7901 Long term (current) use of anticoagulants: Secondary | ICD-10-CM | POA: Diagnosis not present

## 2014-02-15 DIAGNOSIS — I4891 Unspecified atrial fibrillation: Secondary | ICD-10-CM | POA: Diagnosis not present

## 2014-02-15 LAB — POCT INR: INR: 2

## 2014-03-21 ENCOUNTER — Telehealth: Payer: Self-pay

## 2014-03-21 DIAGNOSIS — E876 Hypokalemia: Secondary | ICD-10-CM

## 2014-03-21 MED ORDER — POTASSIUM CHLORIDE CRYS ER 20 MEQ PO TBCR: 20.0000 meq | EXTENDED_RELEASE_TABLET | Freq: Every day | ORAL | Status: DC

## 2014-03-21 MED ORDER — PRAVASTATIN SODIUM 40 MG PO TABS: 40.0000 mg | ORAL_TABLET | Freq: Every day | ORAL | Status: DC

## 2014-03-21 NOTE — Telephone Encounter (Signed)
She is correct - I've refilled potassium to have her take higher dose - 22mEq once daily. I'd also like her to come in for labwork to recheck potassium levels. Labs ordered.

## 2014-03-21 NOTE — Telephone Encounter (Signed)
Pt left v/m; on 02/01/14 pt was advised to increase potassium to twice a day. Pt thought she was to have another appt or lab appt after increasing potassium. Pt does not have future appt scheduled and pt has taken all the potassium she has. Pt request cb.

## 2014-03-21 NOTE — Telephone Encounter (Signed)
Patient notified and lab appt scheduled.  

## 2014-03-26 ENCOUNTER — Other Ambulatory Visit: Payer: Self-pay | Admitting: Family Medicine

## 2014-03-26 DIAGNOSIS — E876 Hypokalemia: Secondary | ICD-10-CM

## 2014-03-26 MED ORDER — POTASSIUM CHLORIDE CRYS ER 20 MEQ PO TBCR: 20.0000 meq | EXTENDED_RELEASE_TABLET | Freq: Every day | ORAL | Status: DC

## 2014-03-29 ENCOUNTER — Ambulatory Visit (INDEPENDENT_AMBULATORY_CARE_PROVIDER_SITE_OTHER): Payer: Medicare Other | Admitting: Family Medicine

## 2014-03-29 ENCOUNTER — Other Ambulatory Visit (INDEPENDENT_AMBULATORY_CARE_PROVIDER_SITE_OTHER): Payer: Medicare Other

## 2014-03-29 DIAGNOSIS — E876 Hypokalemia: Secondary | ICD-10-CM | POA: Diagnosis not present

## 2014-03-29 DIAGNOSIS — Z7901 Long term (current) use of anticoagulants: Secondary | ICD-10-CM | POA: Diagnosis not present

## 2014-03-29 DIAGNOSIS — Z5181 Encounter for therapeutic drug level monitoring: Secondary | ICD-10-CM

## 2014-03-29 DIAGNOSIS — I4891 Unspecified atrial fibrillation: Secondary | ICD-10-CM

## 2014-03-29 LAB — RENAL FUNCTION PANEL
Albumin: 3.8 g/dL (ref 3.5–5.2)
BUN: 26 mg/dL — ABNORMAL HIGH (ref 6–23)
CALCIUM: 10.5 mg/dL (ref 8.4–10.5)
CHLORIDE: 96 meq/L (ref 96–112)
CO2: 34 mEq/L — ABNORMAL HIGH (ref 19–32)
Creatinine, Ser: 1.2 mg/dL (ref 0.4–1.2)
GFR: 44.95 mL/min — ABNORMAL LOW (ref >60.00)
GLUCOSE: 107 mg/dL — AB (ref 70–99)
POTASSIUM: 3.5 meq/L (ref 3.5–5.1)
Phosphorus: 4.3 mg/dL (ref 2.3–4.6)
SODIUM: 139 meq/L (ref 135–145)

## 2014-03-29 LAB — POCT INR: INR: 3.1

## 2014-04-02 ENCOUNTER — Encounter: Payer: Self-pay | Admitting: *Deleted

## 2014-04-26 ENCOUNTER — Ambulatory Visit (INDEPENDENT_AMBULATORY_CARE_PROVIDER_SITE_OTHER): Payer: Medicare Other | Admitting: Family Medicine

## 2014-04-26 DIAGNOSIS — Z7901 Long term (current) use of anticoagulants: Secondary | ICD-10-CM | POA: Diagnosis not present

## 2014-04-26 DIAGNOSIS — Z5181 Encounter for therapeutic drug level monitoring: Secondary | ICD-10-CM | POA: Diagnosis not present

## 2014-04-26 DIAGNOSIS — I4891 Unspecified atrial fibrillation: Secondary | ICD-10-CM

## 2014-04-26 LAB — POCT INR: INR: 2.6

## 2014-05-16 ENCOUNTER — Other Ambulatory Visit: Payer: Self-pay | Admitting: Family Medicine

## 2014-05-22 ENCOUNTER — Other Ambulatory Visit: Payer: Self-pay | Admitting: Family Medicine

## 2014-05-22 MED ORDER — ATENOLOL 50 MG PO TABS: ORAL_TABLET | ORAL | Status: DC

## 2014-05-23 DIAGNOSIS — Z1231 Encounter for screening mammogram for malignant neoplasm of breast: Secondary | ICD-10-CM | POA: Diagnosis not present

## 2014-05-23 LAB — HM MAMMOGRAPHY: HM MAMMO: NORMAL

## 2014-05-24 ENCOUNTER — Encounter: Payer: Self-pay | Admitting: *Deleted

## 2014-05-24 ENCOUNTER — Encounter: Payer: Self-pay | Admitting: Family Medicine

## 2014-06-07 ENCOUNTER — Ambulatory Visit (INDEPENDENT_AMBULATORY_CARE_PROVIDER_SITE_OTHER): Payer: Medicare Other | Admitting: Family Medicine

## 2014-06-07 DIAGNOSIS — Z5181 Encounter for therapeutic drug level monitoring: Secondary | ICD-10-CM

## 2014-06-07 DIAGNOSIS — I4891 Unspecified atrial fibrillation: Secondary | ICD-10-CM

## 2014-06-07 DIAGNOSIS — Z7901 Long term (current) use of anticoagulants: Secondary | ICD-10-CM | POA: Diagnosis not present

## 2014-06-07 LAB — POCT INR: INR: 2.7

## 2014-07-23 ENCOUNTER — Other Ambulatory Visit (INDEPENDENT_AMBULATORY_CARE_PROVIDER_SITE_OTHER): Payer: Medicare Other

## 2014-07-23 ENCOUNTER — Encounter: Payer: Self-pay | Admitting: Family Medicine

## 2014-07-23 DIAGNOSIS — Z5181 Encounter for therapeutic drug level monitoring: Secondary | ICD-10-CM | POA: Diagnosis not present

## 2014-07-23 DIAGNOSIS — Z7901 Long term (current) use of anticoagulants: Secondary | ICD-10-CM | POA: Diagnosis not present

## 2014-07-23 DIAGNOSIS — I4891 Unspecified atrial fibrillation: Secondary | ICD-10-CM | POA: Diagnosis not present

## 2014-07-23 LAB — PROTIME-INR
INR: 3.3 ratio — ABNORMAL HIGH (ref 0.8–1.0)
PROTHROMBIN TIME: 35.2 s — AB (ref 9.6–13.1)

## 2014-08-02 DIAGNOSIS — Z23 Encounter for immunization: Secondary | ICD-10-CM | POA: Diagnosis not present

## 2014-08-13 ENCOUNTER — Other Ambulatory Visit (INDEPENDENT_AMBULATORY_CARE_PROVIDER_SITE_OTHER): Payer: Medicare Other

## 2014-08-13 DIAGNOSIS — Z5181 Encounter for therapeutic drug level monitoring: Secondary | ICD-10-CM | POA: Diagnosis not present

## 2014-08-13 DIAGNOSIS — I4891 Unspecified atrial fibrillation: Secondary | ICD-10-CM

## 2014-08-13 LAB — PROTIME-INR
INR: 3.9 ratio — ABNORMAL HIGH (ref 0.8–1.0)
Prothrombin Time: 41.7 s — ABNORMAL HIGH (ref 9.6–13.1)

## 2014-09-05 ENCOUNTER — Other Ambulatory Visit: Payer: Self-pay | Admitting: Family Medicine

## 2014-09-11 ENCOUNTER — Other Ambulatory Visit: Payer: Medicare Other

## 2014-09-11 ENCOUNTER — Ambulatory Visit (INDEPENDENT_AMBULATORY_CARE_PROVIDER_SITE_OTHER): Payer: Medicare Other | Admitting: Family Medicine

## 2014-09-11 DIAGNOSIS — I4891 Unspecified atrial fibrillation: Secondary | ICD-10-CM | POA: Diagnosis not present

## 2014-09-11 LAB — POCT INR: INR: 2

## 2014-10-24 ENCOUNTER — Other Ambulatory Visit (INDEPENDENT_AMBULATORY_CARE_PROVIDER_SITE_OTHER): Payer: Medicare Other

## 2014-10-24 DIAGNOSIS — Z5181 Encounter for therapeutic drug level monitoring: Secondary | ICD-10-CM | POA: Diagnosis not present

## 2014-10-24 DIAGNOSIS — Z7901 Long term (current) use of anticoagulants: Secondary | ICD-10-CM

## 2014-10-24 DIAGNOSIS — I4891 Unspecified atrial fibrillation: Secondary | ICD-10-CM

## 2014-10-24 LAB — POCT INR: INR: 2.1

## 2014-12-05 ENCOUNTER — Other Ambulatory Visit (INDEPENDENT_AMBULATORY_CARE_PROVIDER_SITE_OTHER): Payer: Medicare Other

## 2014-12-05 ENCOUNTER — Other Ambulatory Visit: Payer: Medicare Other

## 2014-12-05 DIAGNOSIS — Z7901 Long term (current) use of anticoagulants: Secondary | ICD-10-CM

## 2014-12-05 DIAGNOSIS — Z5181 Encounter for therapeutic drug level monitoring: Secondary | ICD-10-CM | POA: Diagnosis not present

## 2014-12-05 DIAGNOSIS — I4891 Unspecified atrial fibrillation: Secondary | ICD-10-CM

## 2014-12-05 LAB — POCT INR: INR: 1.5

## 2014-12-19 ENCOUNTER — Other Ambulatory Visit: Payer: Medicare Other

## 2014-12-19 ENCOUNTER — Other Ambulatory Visit (INDEPENDENT_AMBULATORY_CARE_PROVIDER_SITE_OTHER): Payer: Medicare Other

## 2014-12-19 DIAGNOSIS — Z7901 Long term (current) use of anticoagulants: Secondary | ICD-10-CM | POA: Diagnosis not present

## 2014-12-19 DIAGNOSIS — Z5181 Encounter for therapeutic drug level monitoring: Secondary | ICD-10-CM | POA: Diagnosis not present

## 2014-12-19 DIAGNOSIS — I4891 Unspecified atrial fibrillation: Secondary | ICD-10-CM

## 2014-12-19 LAB — POCT INR: INR: 1.9

## 2015-01-07 ENCOUNTER — Other Ambulatory Visit: Payer: Self-pay | Admitting: Family Medicine

## 2015-01-09 ENCOUNTER — Other Ambulatory Visit: Payer: Self-pay | Admitting: Family Medicine

## 2015-01-09 ENCOUNTER — Other Ambulatory Visit: Payer: Medicare Other

## 2015-01-09 ENCOUNTER — Other Ambulatory Visit (INDEPENDENT_AMBULATORY_CARE_PROVIDER_SITE_OTHER): Payer: Medicare Other

## 2015-01-09 DIAGNOSIS — I10 Essential (primary) hypertension: Secondary | ICD-10-CM | POA: Diagnosis not present

## 2015-01-09 DIAGNOSIS — Z7901 Long term (current) use of anticoagulants: Secondary | ICD-10-CM

## 2015-01-09 DIAGNOSIS — E7800 Pure hypercholesterolemia, unspecified: Secondary | ICD-10-CM

## 2015-01-09 DIAGNOSIS — Z5181 Encounter for therapeutic drug level monitoring: Secondary | ICD-10-CM | POA: Diagnosis not present

## 2015-01-09 DIAGNOSIS — I4891 Unspecified atrial fibrillation: Secondary | ICD-10-CM | POA: Diagnosis not present

## 2015-01-09 DIAGNOSIS — D751 Secondary polycythemia: Secondary | ICD-10-CM

## 2015-01-09 LAB — CBC WITH DIFFERENTIAL/PLATELET
Basophils Absolute: 0.1 10*3/uL (ref 0.0–0.1)
Basophils Relative: 0.7 % (ref 0.0–3.0)
Eosinophils Absolute: 0.5 10*3/uL (ref 0.0–0.7)
Eosinophils Relative: 6.6 % — ABNORMAL HIGH (ref 0.0–5.0)
HCT: 47.2 % — ABNORMAL HIGH (ref 36.0–46.0)
HEMOGLOBIN: 15.4 g/dL — AB (ref 12.0–15.0)
LYMPHS PCT: 19.4 % (ref 12.0–46.0)
Lymphs Abs: 1.5 10*3/uL (ref 0.7–4.0)
MCHC: 32.7 g/dL (ref 30.0–36.0)
MCV: 88.5 fl (ref 78.0–100.0)
MONOS PCT: 12.3 % — AB (ref 3.0–12.0)
Monocytes Absolute: 1 10*3/uL (ref 0.1–1.0)
NEUTROS ABS: 4.8 10*3/uL (ref 1.4–7.7)
Neutrophils Relative %: 61 % (ref 43.0–77.0)
Platelets: 177 10*3/uL (ref 150.0–400.0)
RBC: 5.33 Mil/uL — ABNORMAL HIGH (ref 3.87–5.11)
RDW: 14.1 % (ref 11.5–15.5)
WBC: 7.8 10*3/uL (ref 4.0–10.5)

## 2015-01-09 LAB — COMPREHENSIVE METABOLIC PANEL
ALK PHOS: 64 U/L (ref 39–117)
ALT: 14 U/L (ref 0–35)
AST: 23 U/L (ref 0–37)
Albumin: 4 g/dL (ref 3.5–5.2)
BILIRUBIN TOTAL: 0.6 mg/dL (ref 0.2–1.2)
BUN: 32 mg/dL — ABNORMAL HIGH (ref 6–23)
CO2: 35 mEq/L — ABNORMAL HIGH (ref 19–32)
Calcium: 10.3 mg/dL (ref 8.4–10.5)
Chloride: 98 mEq/L (ref 96–112)
Creatinine, Ser: 1.08 mg/dL (ref 0.40–1.20)
GFR: 51.15 mL/min — ABNORMAL LOW (ref >60.00)
GLUCOSE: 71 mg/dL (ref 70–99)
POTASSIUM: 3.7 meq/L (ref 3.5–5.1)
Sodium: 136 mEq/L (ref 135–145)
Total Protein: 7.1 g/dL (ref 6.0–8.3)

## 2015-01-09 LAB — LIPID PANEL
Cholesterol: 148 mg/dL (ref 0–200)
HDL: 75.1 mg/dL (ref >39.00)
LDL Cholesterol: 55 mg/dL (ref 0–99)
NonHDL: 72.9
TRIGLYCERIDES: 89 mg/dL (ref 0.0–149.0)
Total CHOL/HDL Ratio: 2
VLDL: 17.8 mg/dL (ref 0.0–40.0)

## 2015-01-09 LAB — POCT INR: INR: 2.6

## 2015-01-15 ENCOUNTER — Encounter: Payer: Self-pay | Admitting: Family Medicine

## 2015-01-15 ENCOUNTER — Ambulatory Visit (INDEPENDENT_AMBULATORY_CARE_PROVIDER_SITE_OTHER): Payer: Medicare Other | Admitting: Family Medicine

## 2015-01-15 VITALS — BP 112/64 | HR 68 | Temp 97.3°F | Ht 68.0 in | Wt 149.8 lb

## 2015-01-15 DIAGNOSIS — Z7189 Other specified counseling: Secondary | ICD-10-CM | POA: Diagnosis not present

## 2015-01-15 DIAGNOSIS — Z0000 Encounter for general adult medical examination without abnormal findings: Secondary | ICD-10-CM | POA: Diagnosis not present

## 2015-01-15 DIAGNOSIS — E7800 Pure hypercholesterolemia, unspecified: Secondary | ICD-10-CM

## 2015-01-15 DIAGNOSIS — I10 Essential (primary) hypertension: Secondary | ICD-10-CM | POA: Diagnosis not present

## 2015-01-15 DIAGNOSIS — I4891 Unspecified atrial fibrillation: Secondary | ICD-10-CM | POA: Diagnosis not present

## 2015-01-15 MED ORDER — TRIAMTERENE-HCTZ 37.5-25 MG PO TABS: 1.0000 | ORAL_TABLET | Freq: Every day | ORAL | Status: DC
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
## 2015-01-15 NOTE — Assessment & Plan Note (Signed)

## 2015-01-15 NOTE — Patient Instructions (Addendum)
Bring [place] a copy of your living will to update your chart. Monitor weight and let me know if persistently dropping over next few months. Let's decrease triamterene hydrochlorothiazide to 37.5/25mg  once daily. Return in 1 month for lab visit only (to recheck potassium). Monitor blood pressures at home and let me know if running high. Good to see you today, call us with questions. Return as needed or in 1 year for next physical.

## 2015-01-15 NOTE — Assessment & Plan Note (Signed)
Chronic, stable. Continue pravastatin 40mg daily. 

## 2015-01-15 NOTE — Progress Notes (Signed)
Pre visit review using our clinic review tool, if applicable. No additional management support is needed unless otherwise documented below in the visit note. 

## 2015-01-15 NOTE — Assessment & Plan Note (Signed)
Advanced directive: has at home. HCPOA - wants son Erica Bridges to be this

## 2015-01-15 NOTE — Assessment & Plan Note (Signed)
Chronic, stable. ?orthostatic dizziness related - and weight loss noted as well. Decrease maxzide to 37.5/25mg  dose continue Kdur 83mEq daily. Check renal panel in 1 month.

## 2015-01-15 NOTE — Assessment & Plan Note (Signed)
Rate controlled, continue coumadin.

## 2015-02-04 ENCOUNTER — Other Ambulatory Visit: Payer: Self-pay | Admitting: Family Medicine

## 2015-02-04 NOTE — Telephone Encounter (Signed)
Please advise.//AB/CMA 

## 2015-02-07 ENCOUNTER — Other Ambulatory Visit (INDEPENDENT_AMBULATORY_CARE_PROVIDER_SITE_OTHER): Payer: Medicare Other

## 2015-02-07 DIAGNOSIS — Z7901 Long term (current) use of anticoagulants: Secondary | ICD-10-CM | POA: Diagnosis not present

## 2015-02-07 DIAGNOSIS — I482 Chronic atrial fibrillation, unspecified: Secondary | ICD-10-CM

## 2015-02-07 DIAGNOSIS — I4891 Unspecified atrial fibrillation: Secondary | ICD-10-CM

## 2015-02-07 DIAGNOSIS — Z5181 Encounter for therapeutic drug level monitoring: Secondary | ICD-10-CM | POA: Diagnosis not present

## 2015-02-07 LAB — POCT INR: INR: 3.7

## 2015-02-14 ENCOUNTER — Other Ambulatory Visit (INDEPENDENT_AMBULATORY_CARE_PROVIDER_SITE_OTHER): Payer: Medicare Other

## 2015-02-14 DIAGNOSIS — I10 Essential (primary) hypertension: Secondary | ICD-10-CM

## 2015-02-14 LAB — RENAL FUNCTION PANEL
Albumin: 4 g/dL (ref 3.5–5.2)
BUN: 23 mg/dL (ref 6–23)
CHLORIDE: 102 meq/L (ref 96–112)
CO2: 33 meq/L — AB (ref 19–32)
CREATININE: 0.99 mg/dL (ref 0.40–1.20)
Calcium: 10.1 mg/dL (ref 8.4–10.5)
GFR: 56.54 mL/min — AB (ref >60.00)
Glucose, Bld: 82 mg/dL (ref 70–99)
POTASSIUM: 3.9 meq/L (ref 3.5–5.1)
Phosphorus: 3.2 mg/dL (ref 2.3–4.6)
SODIUM: 140 meq/L (ref 135–145)

## 2015-02-15 ENCOUNTER — Encounter: Payer: Self-pay | Admitting: *Deleted

## 2015-02-21 ENCOUNTER — Other Ambulatory Visit (INDEPENDENT_AMBULATORY_CARE_PROVIDER_SITE_OTHER): Payer: Medicare Other

## 2015-02-21 DIAGNOSIS — Z5181 Encounter for therapeutic drug level monitoring: Secondary | ICD-10-CM

## 2015-02-21 DIAGNOSIS — I4891 Unspecified atrial fibrillation: Secondary | ICD-10-CM | POA: Diagnosis not present

## 2015-02-21 DIAGNOSIS — Z7901 Long term (current) use of anticoagulants: Secondary | ICD-10-CM | POA: Diagnosis not present

## 2015-02-21 LAB — POCT INR: INR: 2.9

## 2015-03-21 ENCOUNTER — Other Ambulatory Visit: Payer: Medicare Other

## 2015-03-21 ENCOUNTER — Ambulatory Visit (INDEPENDENT_AMBULATORY_CARE_PROVIDER_SITE_OTHER): Payer: Medicare Other | Admitting: *Deleted

## 2015-03-21 DIAGNOSIS — Z7901 Long term (current) use of anticoagulants: Secondary | ICD-10-CM

## 2015-03-21 DIAGNOSIS — I4891 Unspecified atrial fibrillation: Secondary | ICD-10-CM

## 2015-03-21 DIAGNOSIS — Z5181 Encounter for therapeutic drug level monitoring: Secondary | ICD-10-CM | POA: Diagnosis not present

## 2015-03-21 LAB — POCT INR: INR: 2.7

## 2015-03-21 NOTE — Progress Notes (Signed)
Pre visit review using our clinic review tool, if applicable. No additional management support is needed unless otherwise documented below in the visit note. 

## 2015-03-26 ENCOUNTER — Other Ambulatory Visit: Payer: Self-pay | Admitting: Family Medicine

## 2015-04-18 ENCOUNTER — Ambulatory Visit (INDEPENDENT_AMBULATORY_CARE_PROVIDER_SITE_OTHER): Payer: Medicare Other | Admitting: *Deleted

## 2015-04-18 DIAGNOSIS — I4891 Unspecified atrial fibrillation: Secondary | ICD-10-CM | POA: Diagnosis not present

## 2015-04-18 LAB — POCT INR: INR: 2.4

## 2015-04-18 NOTE — Progress Notes (Signed)
Pre visit review using our clinic review tool, if applicable. No additional management support is needed unless otherwise documented below in the visit note. 

## 2015-05-16 ENCOUNTER — Ambulatory Visit (INDEPENDENT_AMBULATORY_CARE_PROVIDER_SITE_OTHER): Payer: Medicare Other | Admitting: *Deleted

## 2015-05-16 DIAGNOSIS — Z5181 Encounter for therapeutic drug level monitoring: Secondary | ICD-10-CM

## 2015-05-16 DIAGNOSIS — Z7901 Long term (current) use of anticoagulants: Secondary | ICD-10-CM

## 2015-05-16 DIAGNOSIS — I4891 Unspecified atrial fibrillation: Secondary | ICD-10-CM | POA: Diagnosis not present

## 2015-05-16 LAB — POCT INR: INR: 1.9

## 2015-05-16 NOTE — Progress Notes (Signed)
Pre visit review using our clinic review tool, if applicable. No additional management support is needed unless otherwise documented below in the visit note. 

## 2015-06-06 ENCOUNTER — Ambulatory Visit (INDEPENDENT_AMBULATORY_CARE_PROVIDER_SITE_OTHER): Payer: Medicare Other | Admitting: *Deleted

## 2015-06-06 DIAGNOSIS — Z7901 Long term (current) use of anticoagulants: Secondary | ICD-10-CM | POA: Diagnosis not present

## 2015-06-06 DIAGNOSIS — Z23 Encounter for immunization: Secondary | ICD-10-CM | POA: Diagnosis not present

## 2015-06-06 DIAGNOSIS — I4891 Unspecified atrial fibrillation: Secondary | ICD-10-CM | POA: Diagnosis not present

## 2015-06-06 DIAGNOSIS — Z5181 Encounter for therapeutic drug level monitoring: Secondary | ICD-10-CM

## 2015-06-06 LAB — POCT INR: INR: 2.6

## 2015-06-06 MED ORDER — INFLUENZA VAC SPLIT QUAD 0.5 ML IM SUSY
0.5000 mL | PREFILLED_SYRINGE | INTRAMUSCULAR | Status: AC
  Administered 2015-06-06: 0.5 mL via INTRAMUSCULAR

## 2015-06-06 NOTE — Progress Notes (Signed)
Pre visit review using our clinic review tool, if applicable. No additional management support is needed unless otherwise documented below in the visit note. 

## 2015-06-19 ENCOUNTER — Other Ambulatory Visit: Payer: Self-pay | Admitting: Family Medicine

## 2015-06-20 ENCOUNTER — Ambulatory Visit (INDEPENDENT_AMBULATORY_CARE_PROVIDER_SITE_OTHER): Payer: Medicare Other | Admitting: Family Medicine

## 2015-06-20 ENCOUNTER — Encounter: Payer: Self-pay | Admitting: Family Medicine

## 2015-06-20 VITALS — BP 128/78 | HR 84 | Temp 97.4°F | Wt 157.5 lb

## 2015-06-20 DIAGNOSIS — M545 Low back pain, unspecified: Secondary | ICD-10-CM | POA: Insufficient documentation

## 2015-06-20 DIAGNOSIS — R351 Nocturia: Secondary | ICD-10-CM | POA: Diagnosis not present

## 2015-06-20 LAB — POCT URINALYSIS DIPSTICK
BILIRUBIN UA: NEGATIVE
Blood, UA: NEGATIVE
Glucose, UA: NEGATIVE
KETONES UA: NEGATIVE
LEUKOCYTES UA: NEGATIVE
Nitrite, UA: NEGATIVE
PH UA: 6
Protein, UA: NEGATIVE
Spec Grav, UA: 1.02
Urobilinogen, UA: 0.2

## 2015-06-20 NOTE — Progress Notes (Signed)
Pre visit review using our clinic review tool, if applicable. No additional management support is needed unless otherwise documented below in the visit note. 

## 2015-06-20 NOTE — Assessment & Plan Note (Addendum)
Urinalysis normal today. Anticipate flare of lower back pain - rec start tylenol and do exercises provided from [facility] pt advisor on lower back pain. Update if not improving with treatment. If no better, low threshold to check imaging (none recently)
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
## 2015-06-20 NOTE — Patient Instructions (Signed)
Urine looking ok. Try tylenol for back as needed for discomfort as well as exercises for lower back.  If worsening pain or increased shooting pain down legs or numbness or weakness of legs, let me know.

## 2015-07-04 ENCOUNTER — Ambulatory Visit (INDEPENDENT_AMBULATORY_CARE_PROVIDER_SITE_OTHER): Payer: Medicare Other | Admitting: *Deleted

## 2015-07-04 DIAGNOSIS — I4891 Unspecified atrial fibrillation: Secondary | ICD-10-CM

## 2015-07-04 DIAGNOSIS — Z7901 Long term (current) use of anticoagulants: Secondary | ICD-10-CM | POA: Diagnosis not present

## 2015-07-04 DIAGNOSIS — Z5181 Encounter for therapeutic drug level monitoring: Secondary | ICD-10-CM

## 2015-07-04 LAB — POCT INR: INR: 3.4

## 2015-07-04 NOTE — Progress Notes (Signed)
Pre visit review using our clinic review tool, if applicable. No additional management support is needed unless otherwise documented below in the visit note. 

## 2015-07-25 ENCOUNTER — Ambulatory Visit (INDEPENDENT_AMBULATORY_CARE_PROVIDER_SITE_OTHER): Payer: Medicare Other | Admitting: *Deleted

## 2015-07-25 DIAGNOSIS — Z5181 Encounter for therapeutic drug level monitoring: Secondary | ICD-10-CM | POA: Diagnosis not present

## 2015-07-25 DIAGNOSIS — I4891 Unspecified atrial fibrillation: Secondary | ICD-10-CM

## 2015-07-25 DIAGNOSIS — Z7901 Long term (current) use of anticoagulants: Secondary | ICD-10-CM

## 2015-07-25 LAB — POCT INR: INR: 2.4

## 2015-07-25 NOTE — Progress Notes (Signed)
Pre visit review using our clinic review tool, if applicable. No additional management support is needed unless otherwise documented below in the visit note. 

## 2015-08-14 ENCOUNTER — Encounter: Payer: Self-pay | Admitting: Podiatry

## 2015-08-14 ENCOUNTER — Ambulatory Visit (INDEPENDENT_AMBULATORY_CARE_PROVIDER_SITE_OTHER): Payer: Medicare Other | Admitting: Podiatry

## 2015-08-14 ENCOUNTER — Ambulatory Visit: Payer: Medicare Other

## 2015-08-14 ENCOUNTER — Ambulatory Visit (INDEPENDENT_AMBULATORY_CARE_PROVIDER_SITE_OTHER): Payer: Medicare Other

## 2015-08-14 VITALS — BP 124/82 | HR 87 | Temp 96.0°F | Resp 18

## 2015-08-14 DIAGNOSIS — L02611 Cutaneous abscess of right foot: Secondary | ICD-10-CM

## 2015-08-14 DIAGNOSIS — L03115 Cellulitis of right lower limb: Secondary | ICD-10-CM

## 2015-08-14 DIAGNOSIS — R609 Edema, unspecified: Secondary | ICD-10-CM

## 2015-08-14 DIAGNOSIS — L89891 Pressure ulcer of other site, stage 1: Secondary | ICD-10-CM

## 2015-08-14 MED ORDER — CLINDAMYCIN HCL 150 MG PO CAPS: 150.0000 mg | ORAL_CAPSULE | Freq: Four times a day (QID) | ORAL | Status: DC

## 2015-08-14 NOTE — Patient Instructions (Signed)
Today your examination revealed infection/cellulitis in the right foot On the bottom of the right foot there was a callus and after removed there was a small superficial ulcer,, break in the skin Apply topical antibiotic ointment to the bottom of the right foot and cover with gauze daily Begin taking oral medication clindamycin by mouth 1 capsule 4 times a day 10 days If you develop any sudden increase in pain, swelling, redness, warmth present to ER

## 2015-08-14 NOTE — Progress Notes (Signed)
   Subjective:    Patient ID: Erica Bridges, female    DOB: 1927/08/18, 81 y.o.   MRN: [id number]  HPI   The reason for this patient presenting today was a complaint of thick and discolored toenails which he says are awful on causing discomfort. She nails are thick and over time and does not describe any professional care other than self treatment. Also, patient is complaining discomfort on the ball the right foot for approximately 3 months which he says she's been soaking in Epsom salt and notices that when she presses her foot on the gas pedal it is uncomfortable. She denies any professional treatment for this problem. She denies diabetes   Review of Systems  HENT: Positive for hearing loss.   Eyes: Positive for itching.  Respiratory: Positive for shortness of breath.        Difficulty breathing  Cardiovascular: Positive for leg swelling.  Genitourinary: Positive for frequency.  Musculoskeletal: Positive for back pain and gait problem.       Joint pain  Skin:       Change in nails  Neurological: Positive for dizziness, light-headedness and numbness.  All other systems reviewed and are negative.      Objective:   Physical Exam  Patient appears orientated 3 When questioned about the right foot history concerning the swelling and redness patiently had difficult recalling exact duration of this problem, however, eventually estimated approximately three-month history  Objective: Vascular: Nonpitting edema right foot DP and PT pulses 2/4 bilaterally Capillary reflex immediate bilaterally  Neurological: Sensation to 10 g monofilament wire intact 2/bilaterally Vibratory sensation nonreactive bilaterally Ankle reflexes reactive bilaterally  Dermatological: Erythema dorsal right forefoot to approximate midfoot area Erythema plantar right first MPJ with some scaling and slight serous drainage Plantar second MPJ has eschar that after debridement breaks down to a superficial ulcer 5  mm in diameter without release of any active drainage. The area does not probe deep to bone. The right foot is not warm to touch or malodor noted The toenails are extremely hypertrophic, elongated, deformed 1, 2, 3 right and 1, 2, 4 left  Musculoskeletal: HAV deformity right There is no restriction ankle, subtalar, midtarsal joints bilaterally   X-ray examination weightbearing right foot dated 08/14/2015  Intact bony structures without fracture and/or dislocation No increase in soft tissue density No emphysema noted HAV deformity Hammertoe second Isolated punctate calcified area medial head first metatarsal. No x-ray evidence of bony erosion in first metatarsal head  Radiographic impression: No acute bony abnormality noted in right foot     Assessment & Plan:   Assessment: Satisfactory vascular status bilaterally Peripheral neuropathy bilaterally Abscess plantar right foot Cellulitis right foot Neglected symptomatic onychomycoses 6  Plan: Today reviewed the results of the examination x-ray with patient. I made aware that she had cellulitis in the right foot which will require antibiotic therapy. I made aware that if the swelling, redness, pain increases to present to emergency department  The plantar eschar was debrided in the right foot revealing a granular base without any active drainage. The area was dressed with antibiotic ointment and a gauze dressing. Clindamycin 150 mg 4 times a day 10 days prescribed Patient will apply topical antibiotic ointment to the plantar wound right Deferred debridement of mycotic toenails today  Reappoint 14 days or sooner if patient has concern

## 2015-08-15 ENCOUNTER — Encounter: Payer: Self-pay | Admitting: Podiatry

## 2015-08-22 ENCOUNTER — Ambulatory Visit (INDEPENDENT_AMBULATORY_CARE_PROVIDER_SITE_OTHER): Payer: Medicare Other | Admitting: *Deleted

## 2015-08-22 DIAGNOSIS — Z5181 Encounter for therapeutic drug level monitoring: Secondary | ICD-10-CM

## 2015-08-22 DIAGNOSIS — Z7901 Long term (current) use of anticoagulants: Secondary | ICD-10-CM

## 2015-08-22 DIAGNOSIS — I4891 Unspecified atrial fibrillation: Secondary | ICD-10-CM | POA: Diagnosis not present

## 2015-08-22 LAB — POCT INR: INR: 3.5

## 2015-08-22 NOTE — Progress Notes (Signed)
Pre visit review using our clinic review tool, if applicable. No additional management support is needed unless otherwise documented below in the visit note. 

## 2015-08-28 ENCOUNTER — Ambulatory Visit (INDEPENDENT_AMBULATORY_CARE_PROVIDER_SITE_OTHER): Payer: Medicare Other | Admitting: Podiatry

## 2015-08-28 ENCOUNTER — Encounter: Payer: Self-pay | Admitting: Podiatry

## 2015-08-28 VITALS — BP 119/67 | HR 86 | Temp 96.7°F | Resp 12

## 2015-08-28 DIAGNOSIS — B351 Tinea unguium: Secondary | ICD-10-CM

## 2015-08-28 DIAGNOSIS — M79674 Pain in right toe(s): Secondary | ICD-10-CM

## 2015-08-28 DIAGNOSIS — M79675 Pain in left toe(s): Secondary | ICD-10-CM | POA: Diagnosis not present

## 2015-08-28 NOTE — Progress Notes (Signed)
Patient ID: Erica Bridges, female   DOB: [birth year], 81 y.o.   MRN: [id number]  Subjective: This patient presents for a follow-up visit of 08/14/2015 for cellulitis of the right foot first right webspace. Patient has completed 10 days of clindamycin without a complaint from medication station right foot is feeling considerably better. Also, patient is complaining of extremely uncomfortable thickened toenails and requests nail debridement  Objective: Plantar right first webspace has scaling skin. No erythema, edema, drainage noted. Small plantar callus plantar right first MPJ The toenails are extremely elongated, brittle, deformed, discolored and tender to palpation 6-10  Assessment: Resolved  cellulitis right foot Neglected symptomatic onychomycoses 6-10  Plan: Debride any residual scaling plantar right first webspace Debride toenails 6-10 mechanical he electrically without any bleeding  Reappoint 3 months for nail debridement

## 2015-09-05 ENCOUNTER — Ambulatory Visit: Payer: Medicare Other

## 2015-09-09 ENCOUNTER — Ambulatory Visit (INDEPENDENT_AMBULATORY_CARE_PROVIDER_SITE_OTHER): Payer: Medicare Other | Admitting: *Deleted

## 2015-09-09 DIAGNOSIS — Z7901 Long term (current) use of anticoagulants: Secondary | ICD-10-CM | POA: Diagnosis not present

## 2015-09-09 DIAGNOSIS — Z5181 Encounter for therapeutic drug level monitoring: Secondary | ICD-10-CM

## 2015-09-09 DIAGNOSIS — I4891 Unspecified atrial fibrillation: Secondary | ICD-10-CM | POA: Diagnosis not present

## 2015-09-09 LAB — POCT INR: INR: 3.3

## 2015-09-09 NOTE — Progress Notes (Signed)
Pre visit review using our clinic review tool, if applicable. No additional management support is needed unless otherwise documented below in the visit note. 

## 2015-09-22 DIAGNOSIS — M8580 Other specified disorders of bone density and structure, unspecified site: Secondary | ICD-10-CM

## 2015-09-22 HISTORY — DX: Other specified disorders of bone density and structure, unspecified site: M85.80

## 2015-09-30 ENCOUNTER — Ambulatory Visit: Payer: Medicare Other

## 2015-10-03 ENCOUNTER — Ambulatory Visit (INDEPENDENT_AMBULATORY_CARE_PROVIDER_SITE_OTHER): Payer: Commercial Managed Care - HMO | Admitting: *Deleted

## 2015-10-03 ENCOUNTER — Telehealth: Payer: Self-pay | Admitting: *Deleted

## 2015-10-03 DIAGNOSIS — Z5181 Encounter for therapeutic drug level monitoring: Secondary | ICD-10-CM | POA: Diagnosis not present

## 2015-10-03 DIAGNOSIS — Z7901 Long term (current) use of anticoagulants: Secondary | ICD-10-CM | POA: Diagnosis not present

## 2015-10-03 DIAGNOSIS — I4891 Unspecified atrial fibrillation: Secondary | ICD-10-CM | POA: Diagnosis not present

## 2015-10-03 LAB — POCT INR: INR: 1.8

## 2015-10-03 MED ORDER — WARFARIN SODIUM 5 MG PO TABS: ORAL_TABLET | ORAL | Status: DC

## 2015-10-03 MED ORDER — ATENOLOL 50 MG PO TABS: 50.0000 mg | ORAL_TABLET | Freq: Every day | ORAL | Status: DC

## 2015-10-03 MED ORDER — POTASSIUM CHLORIDE CRYS ER 20 MEQ PO TBCR: 20.0000 meq | EXTENDED_RELEASE_TABLET | Freq: Every day | ORAL | Status: DC

## 2015-10-03 MED ORDER — TRIAMTERENE-HCTZ 75-50 MG PO TABS: 1.0000 | ORAL_TABLET | Freq: Every day | ORAL | Status: DC

## 2015-10-03 MED ORDER — PRAVASTATIN SODIUM 40 MG PO TABS: 40.0000 mg | ORAL_TABLET | Freq: Every day | ORAL | Status: DC

## 2015-10-03 NOTE — Progress Notes (Signed)
Pre visit review using our clinic review tool, if applicable. No additional management support is needed unless otherwise documented below in the visit note. 

## 2015-10-08 NOTE — Telephone Encounter (Signed)
Pt left v/m; pt thought triamterene HCTZ 75-50 had instructions take 1/2 tab daily but when picked up med had take 1 tab daily. Pt wants to verify how pt should be taking this med. Med list has one daily.Pt request cb.  01/15/15 office note does have pt taking maxzide 37.5-25 mg.Please advise.

## 2015-10-09 MED ORDER — TRIAMTERENE-HCTZ 75-50 MG PO TABS: 0.5000 | ORAL_TABLET | Freq: Every day | ORAL | Status: DC

## 2015-10-09 NOTE — Telephone Encounter (Signed)
She is correct - should be on half tablet daily of higher dose.  Take 1/2 tab of current dose (75/50mg ) and we will send lower dose at next refill.  BP Readings from Last 3 Encounters:  08/28/15 119/67  08/14/15 124/82  06/20/15 128/78

## 2015-10-09 NOTE — Addendum Note (Signed)
Addended by: Ria Bush on: 10/09/2015 07:30 PM   Modules accepted: Orders

## 2015-10-10 NOTE — Telephone Encounter (Signed)
Patient notified and verbalized understanding. 

## 2015-10-21 ENCOUNTER — Ambulatory Visit: Payer: Commercial Managed Care - HMO

## 2015-10-24 ENCOUNTER — Ambulatory Visit (INDEPENDENT_AMBULATORY_CARE_PROVIDER_SITE_OTHER): Payer: PPO | Admitting: *Deleted

## 2015-10-24 DIAGNOSIS — I4891 Unspecified atrial fibrillation: Secondary | ICD-10-CM

## 2015-10-24 DIAGNOSIS — Z5181 Encounter for therapeutic drug level monitoring: Secondary | ICD-10-CM | POA: Diagnosis not present

## 2015-10-24 DIAGNOSIS — Z7901 Long term (current) use of anticoagulants: Secondary | ICD-10-CM

## 2015-10-24 LAB — POCT INR: INR: 2

## 2015-10-24 NOTE — Progress Notes (Signed)
INR is therapeutic today.  Will continue with same dose.

## 2015-10-24 NOTE — Progress Notes (Signed)
Pre visit review using our clinic review tool, if applicable. No additional management support is needed unless otherwise documented below in the visit note. 

## 2015-11-21 ENCOUNTER — Ambulatory Visit (INDEPENDENT_AMBULATORY_CARE_PROVIDER_SITE_OTHER): Payer: PPO | Admitting: *Deleted

## 2015-11-21 DIAGNOSIS — Z5181 Encounter for therapeutic drug level monitoring: Secondary | ICD-10-CM | POA: Diagnosis not present

## 2015-11-21 DIAGNOSIS — Z7901 Long term (current) use of anticoagulants: Secondary | ICD-10-CM | POA: Diagnosis not present

## 2015-11-21 DIAGNOSIS — I4891 Unspecified atrial fibrillation: Secondary | ICD-10-CM

## 2015-11-21 LAB — POCT INR: INR: 2.1

## 2015-11-21 NOTE — Progress Notes (Signed)
Pre visit review using our clinic review tool, if applicable. No additional management support is needed unless otherwise documented below in the visit note. 

## 2015-12-04 ENCOUNTER — Encounter: Payer: Self-pay | Admitting: Podiatry

## 2015-12-04 ENCOUNTER — Ambulatory Visit (INDEPENDENT_AMBULATORY_CARE_PROVIDER_SITE_OTHER): Payer: PPO | Admitting: Podiatry

## 2015-12-04 VITALS — BP 120/76 | HR 70 | Temp 95.9°F | Resp 14

## 2015-12-04 DIAGNOSIS — L02611 Cutaneous abscess of right foot: Secondary | ICD-10-CM

## 2015-12-04 DIAGNOSIS — M79675 Pain in left toe(s): Secondary | ICD-10-CM

## 2015-12-04 DIAGNOSIS — M79674 Pain in right toe(s): Secondary | ICD-10-CM

## 2015-12-04 DIAGNOSIS — B351 Tinea unguium: Secondary | ICD-10-CM | POA: Diagnosis not present

## 2015-12-04 NOTE — Patient Instructions (Signed)
aNTIBACTERIAL SOAP INSTRUCTIONS  THE DAY AFTER PROCEDURE   For the first dressing change (soak) Place 3-4 drops of antibacterial liquid soap in a quart of warm tap water.  Submerge foot into water for 2  minutes.  If bandage was applied after your procedure, leave on to allow for easy lift off, then remove and continue with soak for the remaining time.  Next, blot area dry with a soft cloth and apply antibiotic ointment such as polysporin, neosporin, or triple antibiotic ointment.  You may then switch to the instructions below    Shower as usual. Before getting out, place a drop of antibacterial liquid soap (Dial) on a wet, clean washcloth.  Gently wipe washcloth over affected area.  Afterward, rinse the area with warm water.  Blot the area dry with a soft cloth and cover with antibiotic ointment (neosporin, polysporin, bacitracin) and band aid or gauze and tape  Today after trimming the callus on the ball of the right foot there was an abscess in the area which was drained and dressed with antibiotic ointment Continue to apply antibiotic ointment and a gauze dressing to the abscess in the ball of the right foot daily and cover with a gauze pad Wear softer flat sole shoe If you develop any sudden pain, swelling, fever, drainage, warmth present to urgent care or emergency department or contact our office

## 2015-12-04 NOTE — Progress Notes (Signed)
Patient ID: Erica Bridges, female   DOB: 02-20-1927, 82 y.o.   MRN: [id number]  Subjective: This patient presents today for a scheduled visit complaining of uncomfortable toenails walking wearing shoes and request toenail debridement. Also, patient is complaining of pain in the plantar callus sub-first MPJ right in the past 2 weeks.  Objective: Orientated 3 DP pulse 0/4 bilaterally PT pulse right palpable PT pulse left nonpalpable Diffuse rubor  forefoot bilaterally The toenails elongated, brittle, deformed, discolored and tender to palpation 6-10 Well-organized plantar callus sub-first right MPJ that after debridement breaks down into an abscess measuring 10 x 5 mm. The drainage  is bloody-like drainage without malodor. The base of the wound is granular. There is no surrounding erythema, edema, warmth surrounding the abscess beneath the plantar callus of right first MPJ Eschar distal third right toe that remains closed after debridement  Assessment: Symptomatic onychomycoses 6-10 Callus with underlying abscess of first right MPJ  Plan: Debridement of toenails 6-10 mechanically and electronically without a bleeding Debrided I&D abscess plantar right first MPJ and apply antibiotic ointment dressing. Patient advised that she had an abscess beneath the callus and was instructed to soak her foot daily in soapy water and apply topical antibiotic ointment and gauze dressing. Advised to wear a flat sole shoe. Advised her to observe the right foot if she knows any sudden swelling, fever, redness, drainage, warmth, pain present to emergency room  Reevaluate 2 weeks

## 2015-12-18 ENCOUNTER — Ambulatory Visit (INDEPENDENT_AMBULATORY_CARE_PROVIDER_SITE_OTHER): Payer: PPO | Admitting: Podiatry

## 2015-12-18 ENCOUNTER — Encounter: Payer: Self-pay | Admitting: Podiatry

## 2015-12-18 VITALS — BP 133/82 | HR 76 | Temp 97.6°F | Resp 12

## 2015-12-18 DIAGNOSIS — L97511 Non-pressure chronic ulcer of other part of right foot limited to breakdown of skin: Secondary | ICD-10-CM

## 2015-12-18 DIAGNOSIS — L89891 Pressure ulcer of other site, stage 1: Secondary | ICD-10-CM | POA: Diagnosis not present

## 2015-12-18 MED ORDER — CLINDAMYCIN HCL 150 MG PO CAPS: 150.0000 mg | ORAL_CAPSULE | Freq: Four times a day (QID) | ORAL | Status: DC

## 2015-12-18 MED ORDER — SILVER SULFADIAZINE 1 % EX CREA: 1.0000 "application " | TOPICAL_CREAM | Freq: Every day | CUTANEOUS | Status: DC

## 2015-12-18 NOTE — Progress Notes (Signed)
Patient ID: Erica Bridges, female   DOB: Oct 10, 1926, 82 y.o.   MRN: [id number]  Subjective: This patient presents for follow-up visit for a ulcer on the plantar right first MPJ with a history of taking clindamycin 150 mg 4 times a day 10 days and applying topical antibiotic ointment to the area.  Objective: Orientated 3 DP pulses 0/4 bilaterally PT pulse right palpable PT pulse left nonpalpable Bilateral diffuse rubor bilaterally Plantar right first MPJ has eschar that remains closed after debridement Distal third right toe has 5 mm superficial ulcer with erythema that extends to the distal interphalangeal joint. There is no active drainage, malodor or warmth  Assessment: Pre-ulcerative plantar callus right first MPJ Superficial ulcer distal third right toe with low-grade cellulitis Decreased pulses suggestive peripheral arterial disease  Plan: Debrided ulcer distal third right and apply Silvadene dressing Patient will continue apply Silvadene dressing and gauze daily Patient will apply topical antibiotic ointment and gauze dressing plantar right first MPJ A surgical shoe was dispensed for to wear when walking, not driviing  Rx Clindamycin 150 mg by mouth 4 times a day 7 days one refill Silvadene cream applied daily to the ulcer third right toe  Patient advised that she develops any sudden pain, swelling, redness present to the emergency department otherwise  Reappoint 7 days

## 2015-12-18 NOTE — Patient Instructions (Signed)
Today your examination revealed closure of the wound on the bottom of the right foot There is a skin ulcer at the end of the third right toe with a low-grade infection surrounding it Begin taking clindamycin 150 mg by mouth one 4 times a day 7 days Apply Silvadene cream to the skin ulcer on the end of the third right toe daily Okay to apply antibiotic ointment  scab on the bottom of the right foot Wear the surgical shoe on the right foot when walking, do not wear when driving  If you develop any sudden pain, swelling, redness present to the emergency department otherwise reevaluate 7 days

## 2015-12-19 ENCOUNTER — Ambulatory Visit: Payer: PPO

## 2015-12-23 ENCOUNTER — Ambulatory Visit (INDEPENDENT_AMBULATORY_CARE_PROVIDER_SITE_OTHER): Payer: PPO | Admitting: *Deleted

## 2015-12-23 DIAGNOSIS — Z5181 Encounter for therapeutic drug level monitoring: Secondary | ICD-10-CM

## 2015-12-23 DIAGNOSIS — Z7901 Long term (current) use of anticoagulants: Secondary | ICD-10-CM | POA: Diagnosis not present

## 2015-12-23 DIAGNOSIS — I4891 Unspecified atrial fibrillation: Secondary | ICD-10-CM | POA: Diagnosis not present

## 2015-12-23 LAB — POCT INR: INR: 1.6

## 2015-12-23 NOTE — Progress Notes (Signed)
INR is sub therapeutic today.  Patient reports one missed dose last week.  She has also recently completed a round of clindamycin.  No diet changes.  Will boost today and tomorrow, then continue as scheduled.  Patient encouraged to avoid greens for the next 2-3 days and to be as consistent as possible when taking Coumadin.  Will recheck in 3 weeks and make additional adjustments at that time if needed.

## 2015-12-23 NOTE — Progress Notes (Signed)
Pre visit review using our clinic review tool, if applicable. No additional management support is needed unless otherwise documented below in the visit note. 

## 2015-12-25 ENCOUNTER — Telehealth: Payer: Self-pay | Admitting: *Deleted

## 2015-12-25 ENCOUNTER — Ambulatory Visit (INDEPENDENT_AMBULATORY_CARE_PROVIDER_SITE_OTHER): Payer: PPO | Admitting: Podiatry

## 2015-12-25 ENCOUNTER — Encounter: Payer: Self-pay | Admitting: Podiatry

## 2015-12-25 VITALS — BP 122/78 | HR 69 | Temp 97.6°F | Resp 12

## 2015-12-25 DIAGNOSIS — L97511 Non-pressure chronic ulcer of other part of right foot limited to breakdown of skin: Secondary | ICD-10-CM

## 2015-12-25 DIAGNOSIS — L89891 Pressure ulcer of other site, stage 1: Secondary | ICD-10-CM | POA: Diagnosis not present

## 2015-12-25 NOTE — Patient Instructions (Addendum)
You are complaining of diarrhea while taking clindamycin 150 mg by mouth one 4 times a day and have completed 6 out of 7 days. Discontinue the use a clindamycin at this time. If the diarrhea does not reduce her continue to improve in 1-3 days contact your medical doctor and explained to him that you've had an antibiotic, clindamycin and your diarrhea persists after discontinue the medication  Apply Silvadene cream to the skin ulcer in the third right toe and the of the ball of the right foot and cover with gauze Wear the surgical shoe when standing walking. Do not wear the surgical shoe when driving  We are referring a vascular examination to check the circulation in your legs and feet and the vascular lab we'll contact you to arrange an appointment  If you develop any sudden increase in pain, swelling, redness, fever present to emergency department otherwise will recheck you've an 7 days

## 2015-12-25 NOTE — Telephone Encounter (Addendum)
Orders for arterial dopplers and ABI w/wo TBI faxed.  12/30/2015-Pt asked if she needed to get the doppler and ABI testing if she has an appt with Dr. Amalia Hailey 01/01/2016 and the testing on 01/02/2016.  I left message informing pt the wound would need to be taken care of and we could always call her with the results of the test later.

## 2015-12-26 NOTE — Progress Notes (Signed)
Patient ID: Erica Bridges, female   DOB: [birth year], 82 y.o.   MRN: [id number]   Expand All Collapse All   Patient ID: Erica Bridges, female DOB: Oct 08, 1926, 82 y.o. MRN: [id number]  Subjective: This patient presents for follow-up visit for a ulcer on the plantar right first MPJ with a history of taking clindamycin 150 mg 4 times a day 10 days and applying topical antibiotic ointment to the area. Patient states that she has 4 more doses of clindamycin Rx'd on the visit of 329 but discontinued because of diarrhea.  Objective: Orientated 3 DP pulses 0/4 bilaterally PT pulse right palpable PT pulse left nonpalpable Bilateral diffuse rubor bilaterally Plantar right first MPJ has eschar that remains closed after debridement Distal third right toe has 5 mm superficial ulcer with erythema that extends to the distal interphalangeal joint. There is no active drainage, malodor or warmth  Assessment: Pre-ulcerative plantar callus right first MPJ Superficial ulcer distal third right toe with low-grade cellulitis Decreased pulses suggestive peripheral arterial disease GI intolerance to clindamycin  Plan: Debrided ulcer distal third right and apply Silvadene dressing Patient will continue apply Silvadene dressing and gauze daily Patient will apply topical antibiotic ointment and gauze dressing plantar right first MPJ A surgical shoe was dispensed for to wear when walking, not driviing Silvadene cream applied daily to the ulcer third right toe  DC Clindamycin 150 mg   Patient advised to observe diarrhea and if does continue without any sign of improvement to contact her medical doctor Referring patient for vascular lab lower extremity arterial Doppler for the indication of nonpalpable pedal pulses   Patient advised that she develops any sudden pain, swelling, redness present to the emergency department otherwise  Reappoint 7 days

## 2016-01-01 ENCOUNTER — Ambulatory Visit (INDEPENDENT_AMBULATORY_CARE_PROVIDER_SITE_OTHER): Payer: PPO | Admitting: Podiatry

## 2016-01-01 ENCOUNTER — Encounter: Payer: Self-pay | Admitting: Podiatry

## 2016-01-01 VITALS — BP 129/85 | HR 72 | Temp 96.1°F | Resp 18

## 2016-01-01 DIAGNOSIS — L89891 Pressure ulcer of other site, stage 1: Secondary | ICD-10-CM | POA: Diagnosis not present

## 2016-01-01 DIAGNOSIS — L97511 Non-pressure chronic ulcer of other part of right foot limited to breakdown of skin: Secondary | ICD-10-CM

## 2016-01-01 NOTE — Progress Notes (Signed)
Patient ID: NAMI VANDERHAM, female   DOB: 09-09-1927, 82 y.o.   MRN: [id number]  Subjective: This patient presents for follow-up visit for a ulcer on the plantar right first MPJ with a history of taking clindamycin 150 mg 4 times a day 10 days and applying topical antibiotic ointment to the area. Patient states that she has 4 more doses of clindamycin Rx'd on the visit of 329 but discontinued because of diarrhea. Patient has discontinue clindamycin and all diarrhea has resolved Patient has scheduled visit for arterial vascular examination 01/02/2016  Objective: Orientated 3 DP pulses 0/4 bilaterally PT pulse right palpable PT pulse left nonpalpable Bilateral diffuse rubor bilaterally Plantar right first MPJ has eschar that remains closed after debridement Distal third right toe has 3 mm superficial ulcer with erythema that extends to the distal interphalangeal joint. There is no active drainage, malodor or warmth  Assessment: Pre-ulcerative plantar callus right first MPJ Superficial ulcer distal third right toe with low-grade cellulitis that has not progressed without antibiotics from previous visit Decreased pulses suggestive peripheral arterial disease GI intolerance to clindamycin  Plan: Debrided ulcer distal third right and apply Silvadene dressing Patient will continue apply Silvadene dressing and gauze daily Patient will apply topical antibiotic ointment and gauze dressing plantar right first MPJ A surgical shoe was dispensed for to wear when walking, not driviing Silvadene cream applied daily to the ulcer third right toe    Referring patient for vascular lab lower extremity arterial Doppler for the indication of nonpalpable pedal pulses schedule  Patient advised that she develops any sudden pain, swelling, redness present to the emergency department otherwise  Reappoint 14 days

## 2016-01-01 NOTE — Patient Instructions (Signed)
Continue to apply Silvadene cream to the skin ulcer on the tip of the third right toe daily and cover with gauze Keep your scheduled visit with the circulation vascular lab on 01/02/2016 Return 2 weeks for follow-up or sooner if you have concern

## 2016-01-02 ENCOUNTER — Ambulatory Visit (HOSPITAL_COMMUNITY)
Admission: RE | Admit: 2016-01-02 | Discharge: 2016-01-02 | Disposition: A | Discharge status: Home / Self Care | Payer: PPO | Source: Ambulatory Visit | Attending: Cardiology | Admitting: Cardiology

## 2016-01-02 DIAGNOSIS — I10 Essential (primary) hypertension: Secondary | ICD-10-CM | POA: Insufficient documentation

## 2016-01-02 DIAGNOSIS — E785 Hyperlipidemia, unspecified: Secondary | ICD-10-CM | POA: Insufficient documentation

## 2016-01-02 DIAGNOSIS — L97511 Non-pressure chronic ulcer of other part of right foot limited to breakdown of skin: Secondary | ICD-10-CM | POA: Diagnosis not present

## 2016-01-07 ENCOUNTER — Telehealth: Payer: Self-pay | Admitting: *Deleted

## 2016-01-07 NOTE — Telephone Encounter (Addendum)
-----   Message from Gean Birchwood, DPM sent at 01/07/2016  8:01 AM EDT -----  Results of lower extremity arterial Doppler 01/06/2016  Impressions No evidence of segmental lower extremity arterial disease at rest, bilaterally. Normal ABI's, bilaterally. Normal bilateral great toe-brachial indices with abnormal waveforms in all ten toes. Notify patient that vascular lab was within normal limits and no follow-up needed.  01/07/2016- Left message to call for results.  01/08/2016-Informed pt of Dr. Phoebe Perch review of 01/06/2016 arterial doppler results, and told pt to keep appt for ulcer care.  Pt states understanding.

## 2016-01-12 ENCOUNTER — Other Ambulatory Visit: Payer: Self-pay | Admitting: Family Medicine

## 2016-01-12 DIAGNOSIS — D751 Secondary polycythemia: Secondary | ICD-10-CM

## 2016-01-12 DIAGNOSIS — N289 Disorder of kidney and ureter, unspecified: Secondary | ICD-10-CM

## 2016-01-12 DIAGNOSIS — I10 Essential (primary) hypertension: Secondary | ICD-10-CM

## 2016-01-12 DIAGNOSIS — E7800 Pure hypercholesterolemia, unspecified: Secondary | ICD-10-CM

## 2016-01-13 ENCOUNTER — Ambulatory Visit (INDEPENDENT_AMBULATORY_CARE_PROVIDER_SITE_OTHER): Payer: PPO | Admitting: *Deleted

## 2016-01-13 ENCOUNTER — Other Ambulatory Visit (INDEPENDENT_AMBULATORY_CARE_PROVIDER_SITE_OTHER): Payer: PPO

## 2016-01-13 DIAGNOSIS — N289 Disorder of kidney and ureter, unspecified: Secondary | ICD-10-CM

## 2016-01-13 DIAGNOSIS — Z5181 Encounter for therapeutic drug level monitoring: Secondary | ICD-10-CM

## 2016-01-13 DIAGNOSIS — I10 Essential (primary) hypertension: Secondary | ICD-10-CM | POA: Diagnosis not present

## 2016-01-13 DIAGNOSIS — E7800 Pure hypercholesterolemia, unspecified: Secondary | ICD-10-CM

## 2016-01-13 DIAGNOSIS — D751 Secondary polycythemia: Secondary | ICD-10-CM

## 2016-01-13 DIAGNOSIS — Z7901 Long term (current) use of anticoagulants: Secondary | ICD-10-CM | POA: Diagnosis not present

## 2016-01-13 DIAGNOSIS — I4891 Unspecified atrial fibrillation: Secondary | ICD-10-CM

## 2016-01-13 LAB — CBC WITH DIFFERENTIAL/PLATELET
Basophils Absolute: 0.1 10*3/uL (ref 0.0–0.1)
Basophils Relative: 0.8 % (ref 0.0–3.0)
EOS PCT: 5.1 % — AB (ref 0.0–5.0)
Eosinophils Absolute: 0.4 10*3/uL (ref 0.0–0.7)
HEMATOCRIT: 48.8 % — AB (ref 36.0–46.0)
Hemoglobin: 15.8 g/dL — ABNORMAL HIGH (ref 12.0–15.0)
LYMPHS ABS: 1.5 10*3/uL (ref 0.7–4.0)
LYMPHS PCT: 20.6 % (ref 12.0–46.0)
MCHC: 32.4 g/dL (ref 30.0–36.0)
MCV: 89.5 fl (ref 78.0–100.0)
Monocytes Absolute: 0.8 10*3/uL (ref 0.1–1.0)
Monocytes Relative: 10.9 % (ref 3.0–12.0)
NEUTROS ABS: 4.6 10*3/uL (ref 1.4–7.7)
NEUTROS PCT: 62.6 % (ref 43.0–77.0)
PLATELETS: 186 10*3/uL (ref 150.0–400.0)
RBC: 5.46 Mil/uL — ABNORMAL HIGH (ref 3.87–5.11)
RDW: 14.4 % (ref 11.5–15.5)
WBC: 7.4 10*3/uL (ref 4.0–10.5)

## 2016-01-13 LAB — LIPID PANEL
CHOL/HDL RATIO: 2
CHOLESTEROL: 148 mg/dL (ref 0–200)
HDL: 78.8 mg/dL (ref >39.00)
LDL Cholesterol: 54 mg/dL (ref 0–99)
NonHDL: 69.29
TRIGLYCERIDES: 75 mg/dL (ref 0.0–149.0)
VLDL: 15 mg/dL (ref 0.0–40.0)

## 2016-01-13 LAB — POCT INR: INR: 1.9

## 2016-01-13 LAB — RENAL FUNCTION PANEL
Albumin: 4 g/dL (ref 3.5–5.2)
BUN: 23 mg/dL (ref 6–23)
CALCIUM: 10.4 mg/dL (ref 8.4–10.5)
CHLORIDE: 99 meq/L (ref 96–112)
CO2: 35 meq/L — AB (ref 19–32)
Creatinine, Ser: 0.99 mg/dL (ref 0.40–1.20)
GFR: 56.42 mL/min — AB (ref >60.00)
Glucose, Bld: 107 mg/dL — ABNORMAL HIGH (ref 70–99)
PHOSPHORUS: 3.4 mg/dL (ref 2.3–4.6)
POTASSIUM: 4.8 meq/L (ref 3.5–5.1)
Sodium: 138 mEq/L (ref 135–145)

## 2016-01-13 LAB — VITAMIN D 25 HYDROXY (VIT D DEFICIENCY, FRACTURES): VITD: 45.23 ng/mL (ref 30.00–100.00)

## 2016-01-13 NOTE — Progress Notes (Signed)
Pre visit review using our clinic review tool, if applicable. No additional management support is needed unless otherwise documented below in the visit note. 

## 2016-01-14 ENCOUNTER — Other Ambulatory Visit: Payer: Medicare Other

## 2016-01-15 ENCOUNTER — Ambulatory Visit (INDEPENDENT_AMBULATORY_CARE_PROVIDER_SITE_OTHER): Payer: PPO | Admitting: Podiatry

## 2016-01-15 ENCOUNTER — Encounter: Payer: Self-pay | Admitting: Podiatry

## 2016-01-15 VITALS — BP 111/76 | HR 82 | Resp 12

## 2016-01-15 DIAGNOSIS — L97511 Non-pressure chronic ulcer of other part of right foot limited to breakdown of skin: Secondary | ICD-10-CM

## 2016-01-15 NOTE — Patient Instructions (Signed)
Continue to apply Silvadene cream daily to the end of the third right toe and cover with gauze Continue wear surgical shoe on the right foot when walking with the exception of driving

## 2016-01-16 NOTE — Progress Notes (Signed)
Patient ID: Erica Bridges, female   DOB: [birth year], 82 y.o.   MRN: [id number]   Subjective: This patient presents for follow-up visit for a ulcer on the plantar right first MPJ with a history of taking clindamycin 150 mg 4 times a day 10 days and applying topical antibiotic ointment to the area. Patient states that she has 4 more doses of clindamycin Rx'd on the visit of 329 but discontinued because of diarrhea. Patient has discontinue clindamycin and all diarrhea has resolved Patient has scheduled visit for arterial vascular examination 01/06/2016 Wound size distal third right toe on 01/01/2016 was 3 mm  Objective: Orientated 3 DP pulses 0/4 bilaterally PT pulse right palpable PT pulse left nonpalpable Bilateral diffuse rubor bilaterally Plantar right first MPJ has eschar that remains closed after debridement Distal third right toe has 1-2 mm superficial ulcer with erythema that extends to the distal interphalangeal joint. There is no active drainage, malodor or warmth  Results of lower extremity arterial Doppler January 06 2016 No evidence of segmental lower extremity arterial disease at rest bilaterally Normal ABIs bilaterally Normal bilateral great toe brachial indices   Assessment: Pre-ulcerative plantar callus right first MPJ Superficial ulcer distal third right toe with low-grade cellulitis that has not progressed without antibiotics from previous visit Decreased pulses suggestive peripheral arterial disease GI intolerance to clindamycin  Plan: I reviewed the results of the arterial Doppler dated 01/06/2016 patient today and gave her hard copy of the report Debrided ulcer distal third right and apply Silvadene dressing Patient will continue apply Silvadene dressing and gauze daily Patient will apply topical antibiotic ointment and gauze dressing plantar right first MPJ A surgical shoe was dispensed for to wear when walking, not driviing Silvadene cream applied daily to the  ulcer third right toe   Reappoint 14 days

## 2016-01-17 ENCOUNTER — Encounter: Payer: Self-pay | Admitting: Family Medicine

## 2016-01-17 ENCOUNTER — Ambulatory Visit (INDEPENDENT_AMBULATORY_CARE_PROVIDER_SITE_OTHER)
Admission: RE | Admit: 2016-01-17 | Discharge: 2016-01-17 | Disposition: A | Discharge status: Home / Self Care | Payer: PPO | Source: Ambulatory Visit | Attending: Family Medicine | Admitting: Family Medicine

## 2016-01-17 ENCOUNTER — Ambulatory Visit (INDEPENDENT_AMBULATORY_CARE_PROVIDER_SITE_OTHER): Payer: PPO | Admitting: Family Medicine

## 2016-01-17 VITALS — BP 114/80 | HR 84 | Temp 97.4°F | Ht 68.0 in | Wt 148.5 lb

## 2016-01-17 DIAGNOSIS — Z0000 Encounter for general adult medical examination without abnormal findings: Secondary | ICD-10-CM

## 2016-01-17 DIAGNOSIS — D751 Secondary polycythemia: Secondary | ICD-10-CM | POA: Diagnosis not present

## 2016-01-17 DIAGNOSIS — C549 Malignant neoplasm of corpus uteri, unspecified: Secondary | ICD-10-CM | POA: Diagnosis not present

## 2016-01-17 DIAGNOSIS — Z7189 Other specified counseling: Secondary | ICD-10-CM

## 2016-01-17 DIAGNOSIS — E7800 Pure hypercholesterolemia, unspecified: Secondary | ICD-10-CM | POA: Diagnosis not present

## 2016-01-17 DIAGNOSIS — I10 Essential (primary) hypertension: Secondary | ICD-10-CM

## 2016-01-17 DIAGNOSIS — I4891 Unspecified atrial fibrillation: Secondary | ICD-10-CM

## 2016-01-17 MED ORDER — ATENOLOL 50 MG PO TABS: 50.0000 mg | ORAL_TABLET | Freq: Every day | ORAL | Status: DC

## 2016-01-17 MED ORDER — POTASSIUM CHLORIDE CRYS ER 10 MEQ PO TBCR: 10.0000 meq | EXTENDED_RELEASE_TABLET | Freq: Every day | ORAL | Status: DC

## 2016-01-17 MED ORDER — TRIAMTERENE-HCTZ 37.5-25 MG PO TABS: 1.0000 | ORAL_TABLET | Freq: Every day | ORAL | Status: DC

## 2016-01-17 MED ORDER — PRAVASTATIN SODIUM 40 MG PO TABS: 40.0000 mg | ORAL_TABLET | Freq: Every day | ORAL | Status: DC

## 2016-01-17 NOTE — Assessment & Plan Note (Signed)
Encouraged f/u with OB/GYN 

## 2016-01-17 NOTE — Progress Notes (Signed)
BP 114/80 mmHg  Pulse 84  Temp(Src) 97.4 F (36.3 C) (Oral)  Ht 5\' 8"  (1.727 m)  Wt 148 lb 8 oz (67.359 kg)  BMI 22.58 kg/m2  SpO2 95%   CC: medicare wellness visit  Subjective:    Patient ID: Erica Bridges, female    DOB: 1927/02/26, 82 y.o.   MRN: [id number]  HPI: Erica Bridges is a 82 y.o. female presenting on 01/17/2016 for Annual Exam   Recently seeing podiatrist Dr Amalia Hailey for R toe ulcer.  Completed ABI - normal ABI/TBI, abnormal waveforms in all 10 toes.   Passes vision screen.  Hearing - uses aides regularly. Not today.  1 fall in last year - fell backwards while walking outside down 3 step, no injury.  Denies depression/anhedonia. Discussed caregiver stress.   Preventative: COLONOSCOPY Date: 03/2013 tubular adenomas, small angiodysplastic lesion at cecum, mod diverticulosis Carlean Purl). H/o uterine cancer s/p hysterectomy 2005. Sees Dr Fernandez/forsythe yearly DEXA - 2014 WNL. Mammogram 05/2014. Pt sets this up herself.  Flu shot - yearly  Td 2010  Pneumovax 2006. prevnar 01/2014 zostavax - declines. Too expensive Scanned into chart (01/2015) - no prolonged life support if deemed terminal/incurable or persistent vegetative state. HCPOA - would be son Beverely Low. Has updated this. Will bring [place] copy. Seat belt use discussed Sunscreen use discussed. No changing moles on skin.  Husband lived at nursing home then deceased. Retired, widow  One son Sports administrator - part time post office), one daughter ([place] Engineer, mining), other daughter Janeece Riggers) Activity: no regular exercise Diet: good water, fruits/vegetables daily  Relevant past medical, surgical, family and social history reviewed and updated as indicated. Interim medical history since our last visit reviewed. Allergies and medications reviewed and updated. Current Outpatient Prescriptions on File Prior to Visit  Medication Sig  . Calcium Carbonate-Vitamin D (CALCIUM 600+D) 600-400 MG-UNIT per tablet  Take 1 tablet by mouth daily.   . cholecalciferol (VITAMIN D) 1000 UNITS tablet Take 1,000 Units by mouth daily.  . Coenzyme Q10 200 MG capsule Take 200 mg by mouth daily.  Vladimir Faster Glycol-Propyl Glycol (SYSTANE) 0.4-0.3 % SOLN Apply 1 drop to eye 4 (four) times daily as needed.   . silver sulfADIAZINE (SILVADENE) 1 % cream Apply 1 application topically daily.  Marland Kitchen warfarin (COUMADIN) 5 MG tablet Take as directed by anti-coagulation clinic.  Marland Kitchen BIOTIN PO Take 1 tablet by mouth daily. Reported on 01/17/2016   No current facility-administered medications on file prior to visit.    Review of Systems Per HPI unless specifically indicated in ROS section     Objective:    BP 114/80 mmHg  Pulse 84  Temp(Src) 97.4 F (36.3 C) (Oral)  Ht 5\' 8"  (1.727 m)  Wt 148 lb 8 oz (67.359 kg)  BMI 22.58 kg/m2  SpO2 95%  Wt Readings from Last 3 Encounters:  01/17/16 148 lb 8 oz (67.359 kg)  06/20/15 157 lb 8 oz (71.442 kg)  01/15/15 149 lb 12.8 oz (67.949 kg)    Physical Exam  Constitutional: She is oriented to person, place, and time. She appears well-developed and well-nourished. No distress.  HENT:  Head: Normocephalic and atraumatic.  Right Ear: Hearing, tympanic membrane, external ear and ear canal normal.  Left Ear: Hearing, tympanic membrane, external ear and ear canal normal.  Nose: Nose normal.  Mouth/Throat: Uvula is midline, oropharynx is clear and moist and mucous membranes are normal. No oropharyngeal exudate, posterior oropharyngeal edema or posterior oropharyngeal erythema.  Eyes: Conjunctivae and EOM  are normal. Pupils are equal, round, and reactive to light. No scleral icterus.  Neck: Normal range of motion. Neck supple. Carotid bruit is not present. No thyromegaly present.  Cardiovascular: Normal rate, regular rhythm, normal heart sounds and intact distal pulses.   No murmur heard. Pulses:      Radial pulses are 2+ on the right side, and 2+ on the left side.  Pulmonary/Chest:  Effort normal and breath sounds normal. No respiratory distress. She has no wheezes. She has no rales.  Abdominal: Soft. Bowel sounds are normal. She exhibits no distension and no mass. There is no tenderness. There is no rebound and no guarding.  Musculoskeletal: Normal range of motion. She exhibits no edema.  Darkening of fingers evident  Lymphadenopathy:    She has no cervical adenopathy.  Neurological: She is alert and oriented to person, place, and time.  CN grossly intact, station and gait intact  Skin: Skin is warm and dry. No rash noted.  Psychiatric: She has a normal mood and affect. Her behavior is normal. Judgment and thought content normal.  Nursing note and vitals reviewed.  Results for orders placed or performed in visit on 01/13/16  Lipid panel  Result Value Ref Range   Cholesterol 148 0 - 200 mg/dL   Triglycerides 75.0 0.0 - 149.0 mg/dL   HDL 78.80 >39.00 mg/dL   VLDL 15.0 0.0 - 40.0 mg/dL   LDL Cholesterol 54 0 - 99 mg/dL   Total CHOL/HDL Ratio 2    NonHDL 69.29   Renal function panel  Result Value Ref Range   Sodium 138 135 - 145 mEq/L   Potassium 4.8 3.5 - 5.1 mEq/L   Chloride 99 96 - 112 mEq/L   CO2 35 (H) 19 - 32 mEq/L   Calcium 10.4 8.4 - 10.5 mg/dL   Albumin 4.0 3.5 - 5.2 g/dL   BUN 23 6 - 23 mg/dL   Creatinine, Ser 0.99 0.40 - 1.20 mg/dL   Glucose, Bld 107 (H) 70 - 99 mg/dL   Phosphorus 3.4 2.3 - 4.6 mg/dL   GFR 56.42 (L) >60.00 mL/min  CBC with Differential/Platelet  Result Value Ref Range   WBC 7.4 4.0 - 10.5 K/uL   RBC 5.46 (H) 3.87 - 5.11 Mil/uL   Hemoglobin 15.8 (H) 12.0 - 15.0 g/dL   HCT 48.8 (H) 36.0 - 46.0 %   MCV 89.5 78.0 - 100.0 fl   MCHC 32.4 30.0 - 36.0 g/dL   RDW 14.4 11.5 - 15.5 %   Platelets 186.0 150.0 - 400.0 K/uL   Neutrophils Relative % 62.6 43.0 - 77.0 %   Lymphocytes Relative 20.6 12.0 - 46.0 %   Monocytes Relative 10.9 3.0 - 12.0 %   Eosinophils Relative 5.1 (H) 0.0 - 5.0 %   Basophils Relative 0.8 0.0 - 3.0 %   Neutro  Abs 4.6 1.4 - 7.7 K/uL   Lymphs Abs 1.5 0.7 - 4.0 K/uL   Monocytes Absolute 0.8 0.1 - 1.0 K/uL   Eosinophils Absolute 0.4 0.0 - 0.7 K/uL   Basophils Absolute 0.1 0.0 - 0.1 K/uL  VITAMIN D 25 Hydroxy (Vit-D Deficiency, Fractures)  Result Value Ref Range   VITD 45.23 30.00 - 100.00 ng/mL      Assessment & Plan:   Problem List Items Addressed This Visit    CANCER, ENDOMETRIUM    Encouraged f/u with OBGYN      HYPERCHOLESTEROLEMIA    Chronic, stable. Continue currnet regimen.      Relevant Medications  atenolol (TENORMIN) 50 MG tablet   pravastatin (PRAVACHOL) 40 MG tablet   triamterene-hydrochlorothiazide (MAXZIDE-25) 37.5-25 MG tablet   Essential hypertension    Chronic, great control even with lower maxzide dose. Will try even lower dose of 1/2 tab of maxzide 37.5/25mg  daily. Decrease potassium to 59mEq daily.      Relevant Medications   atenolol (TENORMIN) 50 MG tablet   pravastatin (PRAVACHOL) 40 MG tablet   triamterene-hydrochlorothiazide (MAXZIDE-25) 37.5-25 MG tablet   Atrial fibrillation (HCC)    Chronic, stable. Continue coumadin 5mg  daily.       Relevant Medications   atenolol (TENORMIN) 50 MG tablet   pravastatin (PRAVACHOL) 40 MG tablet   triamterene-hydrochlorothiazide (MAXZIDE-25) 37.5-25 MG tablet   Medicare annual wellness visit, subsequent - Primary    I have personally reviewed the Medicare Annual Wellness questionnaire and have noted 1. The patient's medical and social history 2. Their use of alcohol, tobacco or illicit drugs 3. Their current medications and supplements 4. The patient's functional ability including ADL's, fall risks, home safety risks and hearing or visual impairment. Cognitive function has been assessed and addressed as indicated.  5. Diet and physical activity 6. Evidence for depression or mood disorders The patients weight, height, BMI have been recorded in the chart. I have made referrals, counseling and provided education to the  patient based on review of the above and I have provided the pt with a written personalized care plan for preventive services. Provider list updated.. See scanned questionairre as needed for further documentation. Reviewed preventative protocols and updated unless pt declined.       Polycythemia    Recurrent. Reviewed normal UA 05/2015. Will check CXR today.  Reassuring O2 sat today.      Relevant Orders   DG Chest 2 View   Advanced care planning/counseling discussion    Scanned into chart (01/2015) - no prolonged life support if deemed terminal/incurable or persistent vegetative state. HCPOA - would be son Beverely Low. Has updated this. Will bring [place] copy.          Follow up plan: Return in about 1 year (around 01/16/2017) for medicare wellness visit.  Ria Bush, MD

## 2016-01-17 NOTE — Assessment & Plan Note (Addendum)
Recurrent. Reviewed normal UA 05/2015. Will check CXR today.  Reassuring O2 sat today.

## 2016-01-17 NOTE — Assessment & Plan Note (Signed)
Scanned into chart (01/2015) - no prolonged life support if deemed terminal/incurable or persistent vegetative state. HCPOA - would be son Beverely Low. Has updated this. Will bring [place] copy.

## 2016-01-17 NOTE — Assessment & Plan Note (Signed)
Chronic, stable. Continue coumadin 5mg  daily.

## 2016-01-17 NOTE — Assessment & Plan Note (Signed)
Chronic, stable. Continue currnet regimen.

## 2016-01-17 NOTE — Assessment & Plan Note (Signed)

## 2016-01-17 NOTE — Patient Instructions (Addendum)
Decrease potassium to 44mq daily. Maxzide 37.5/25mg dose sent to pharmacy - may try 1/2 tablet of that (so lower than what you are currently taking) and if blood pressure remains ok, may continue lower dose.  Schedule mammogram if you're due.  Schedule follow up appointment with Dr FToney Rakes  Bring me updated advanced directive with health care power of attorney form. You are doing well today, return as needed or in 1 year for next physical.   Health Maintenance, Female Adopting a healthy lifestyle and getting preventive care can go a long way to promote health and wellness. Talk with your health care provider about what schedule of regular examinations is right for you. This is a good chance for you to check in with your provider about disease prevention and staying healthy. In between checkups, there are plenty of things you can do on your own. Experts have done a lot of research about which lifestyle changes and preventive measures are most likely to keep you healthy. Ask your health care provider for more information. WEIGHT AND DIET  Eat a healthy diet  Be sure to include plenty of vegetables, fruits, low-fat dairy products, and lean protein.  Do not eat a lot of foods high in solid fats, added sugars, or salt.  Get regular exercise. This is one of the most important things you can do for your health.  Most adults should exercise for at least 150 minutes each week. The exercise should increase your heart rate and make you sweat (moderate-intensity exercise).  Most adults should also do strengthening exercises at least twice a week. This is in addition to the moderate-intensity exercise.  Maintain a healthy weight  Body mass index (BMI) is a measurement that can be used to identify possible weight problems. It estimates body fat based on height and weight. Your health care provider can help determine your BMI and help you achieve or maintain a healthy weight.  For females 253years of  age and older:   A BMI below 18.5 is considered underweight.  A BMI of 18.5 to 24.9 is normal.  A BMI of 25 to 29.9 is considered overweight.  A BMI of 30 and above is considered obese.  Watch levels of cholesterol and blood lipids  You should start having your blood tested for lipids and cholesterol at 80years of age, then have this test every 5 years.  You may need to have your cholesterol levels checked more often if:  Your lipid or cholesterol levels are high.  You are older than 80years of age.  You are at high risk for heart disease.  CANCER SCREENING   Lung Cancer  Lung cancer screening is recommended for adults [id number] old who are at high risk for lung cancer because of a history of smoking.  A yearly low-dose CT scan of the lungs is recommended for people who:  Currently smoke.  Have quit within the past 15 years.  Have at least a 30-pack-year history of smoking. A pack year is smoking an average of one pack of cigarettes a day for 1 year.  Yearly screening should continue until it has been 15 years since you quit.  Yearly screening should stop if you develop a health problem that would prevent you from having lung cancer treatment.  Breast Cancer  Practice breast self-awareness. This means understanding how your breasts normally appear and feel.  It also means doing regular breast self-exams. Let your health care provider know about any  changes, no matter how small.  If you are in your 20s or 30s, you should have a clinical breast exam (CBE) by a health care provider every 1-3 years as part of a regular health exam.  If you are 39 or older, have a CBE every year. Also consider having a breast X-ray (mammogram) every year.  If you have a family history of breast cancer, talk to your health care provider about genetic screening.  If you are at high risk for breast cancer, talk to your health care provider about having an MRI and a mammogram every  year.  Breast cancer gene (BRCA) assessment is recommended for women who have family members with BRCA-related cancers. BRCA-related cancers include:  Breast.  Ovarian.  Tubal.  Peritoneal cancers.  Results of the assessment will determine the need for genetic counseling and BRCA1 and BRCA2 testing. Cervical Cancer Your health care provider may recommend that you be screened regularly for cancer of the pelvic organs (ovaries, uterus, and vagina). This screening involves a pelvic examination, including checking for microscopic changes to the surface of your cervix (Pap test). You may be encouraged to have this screening done every 3 years, beginning at age 65.  For women ages 20-65, health care providers may recommend pelvic exams and Pap testing every 3 years, or they may recommend the Pap and pelvic exam, combined with testing for human papilloma virus (HPV), every 5 years. Some types of HPV increase your risk of cervical cancer. Testing for HPV may also be done on women of any age with unclear Pap test results.  Other health care providers may not recommend any screening for nonpregnant women who are considered low risk for pelvic cancer and who do not have symptoms. Ask your health care provider if a screening pelvic exam is right for you.  If you have had past treatment for cervical cancer or a condition that could lead to cancer, you need Pap tests and screening for cancer for at least 20 years after your treatment. If Pap tests have been discontinued, your risk factors (such as having a new sexual partner) need to be reassessed to determine if screening should resume. Some women have medical problems that increase the chance of getting cervical cancer. In these cases, your health care provider may recommend more frequent screening and Pap tests. Colorectal Cancer  This type of cancer can be detected and often prevented.  Routine colorectal cancer screening usually begins at 80 years of  age and continues through 80 years of age.  Your health care provider may recommend screening at an earlier age if you have risk factors for colon cancer.  Your health care provider may also recommend using home test kits to check for hidden blood in the stool.  A small camera at the end of a tube can be used to examine your colon directly (sigmoidoscopy or colonoscopy). This is done to check for the earliest forms of colorectal cancer.  Routine screening usually begins at age 82.  Direct examination of the colon should be repeated every 5-10 years through 80 years of age. However, you may need to be screened more often if early forms of precancerous polyps or small growths are found. Skin Cancer  Check your skin from head to toe regularly.  Tell your health care provider about any new moles or changes in moles, especially if there is a change in a mole's shape or color.  Also tell your health care provider if you have a  mole that is larger than the size of a pencil eraser.  Always use sunscreen. Apply sunscreen liberally and repeatedly throughout the day.  Protect yourself by wearing long sleeves, pants, a wide-brimmed hat, and sunglasses whenever you are outside. HEART DISEASE, DIABETES, AND HIGH BLOOD PRESSURE   High blood pressure causes heart disease and increases the risk of stroke. High blood pressure is more likely to develop in:  People who have blood pressure in the high end of the normal range (130-139/85-89 mm Hg).  People who are overweight or obese.  People who are African American.  If you are 76-27 years of age, have your blood pressure checked every 3-5 years. If you are 15 years of age or older, have your blood pressure checked every year. You should have your blood pressure measured twice--once when you are at a hospital or clinic, and once when you are not at a hospital or clinic. Record the average of the two measurements. To check your blood pressure when you are  not at a hospital or clinic, you can use:  An automated blood pressure machine at a pharmacy.  A home blood pressure monitor.  If you are between 20 years and 57 years old, ask your health care provider if you should take aspirin to prevent strokes.  Have regular diabetes screenings. This involves taking a blood sample to check your fasting blood sugar level.  If you are at a normal weight and have a low risk for diabetes, have this test once every three years after 80 years of age.  If you are overweight and have a high risk for diabetes, consider being tested at a younger age or more often. PREVENTING INFECTION  Hepatitis B  If you have a higher risk for hepatitis B, you should be screened for this virus. You are considered at high risk for hepatitis B if:  You were born in a country where hepatitis B is common. Ask your health care provider which countries are considered high risk.  Your parents were born in a high-risk country, and you have not been immunized against hepatitis B (hepatitis B vaccine).  You have HIV or AIDS.  You use needles to inject street drugs.  You live with someone who has hepatitis B.  You have had sex with someone who has hepatitis B.  You get hemodialysis treatment.  You take certain medicines for conditions, including cancer, organ transplantation, and autoimmune conditions. Hepatitis C  Blood testing is recommended for:  Everyone born from [birth year] through 1963.  Anyone with known risk factors for hepatitis C. Sexually transmitted infections (STIs)  You should be screened for sexually transmitted infections (STIs) including gonorrhea and chlamydia if:  You are sexually active and are younger than 80 years of age.  You are older than 80 years of age and your health care provider tells you that you are at risk for this type of infection.  Your sexual activity has changed since you were last screened and you are at an increased risk for  chlamydia or gonorrhea. Ask your health care provider if you are at risk.  If you do not have HIV, but are at risk, it may be recommended that you take a prescription medicine daily to prevent HIV infection. This is called pre-exposure prophylaxis (PrEP). You are considered at risk if:  You are sexually active and do not regularly use condoms or know the HIV status of your partner(s).  You take drugs by injection.  You are  sexually active with a partner who has HIV. Talk with your health care provider about whether you are at high risk of being infected with HIV. If you choose to begin PrEP, you should first be tested for HIV. You should then be tested every 3 months for as long as you are taking PrEP.  PREGNANCY   If you are premenopausal and you may become pregnant, ask your health care provider about preconception counseling.  If you may become pregnant, take 400 to 800 micrograms (mcg) of folic acid every day.  If you want to prevent pregnancy, talk to your health care provider about birth control (contraception). OSTEOPOROSIS AND MENOPAUSE   Osteoporosis is a disease in which the bones lose minerals and strength with aging. This can result in serious bone fractures. Your risk for osteoporosis can be identified using a bone density scan.  If you are 35 years of age or older, or if you are at risk for osteoporosis and fractures, ask your health care provider if you should be screened.  Ask your health care provider whether you should take a calcium or vitamin D supplement to lower your risk for osteoporosis.  Menopause may have certain physical symptoms and risks.  Hormone replacement therapy may reduce some of these symptoms and risks. Talk to your health care provider about whether hormone replacement therapy is right for you.  HOME CARE INSTRUCTIONS   Schedule regular health, dental, and eye exams.  Stay current with your immunizations.   Do not use any tobacco products  including cigarettes, chewing tobacco, or electronic cigarettes.  If you are pregnant, do not drink alcohol.  If you are breastfeeding, limit how much and how often you drink alcohol.  Limit alcohol intake to no more than 1 drink per day for nonpregnant women. One drink equals 12 ounces of beer, 5 ounces of wine, or 1 ounces of hard liquor.  Do not use street drugs.  Do not share needles.  Ask your health care provider for help if you need support or information about quitting drugs.  Tell your health care provider if you often feel depressed.  Tell your health care provider if you have ever been abused or do not feel safe at home.   This information is not intended to replace advice given to you by your health care provider. Make sure you discuss any questions you have with your health care provider.   Document Released: 03/23/2011 Document Revised: 09/28/2014 Document Reviewed: 08/09/2013 Elsevier Interactive Patient Education [employer].

## 2016-01-17 NOTE — Assessment & Plan Note (Signed)
Chronic, great control even with lower maxzide dose. Will try even lower dose of 1/2 tab of maxzide 37.5/25mg  daily. Decrease potassium to 67mEq daily.

## 2016-01-17 NOTE — Progress Notes (Signed)
Pre visit review using our clinic review tool, if applicable. No additional management support is needed unless otherwise documented below in the visit note. 

## 2016-01-18 ENCOUNTER — Encounter: Payer: Self-pay | Admitting: Family Medicine

## 2016-01-29 DIAGNOSIS — H3530 Unspecified macular degeneration: Secondary | ICD-10-CM | POA: Diagnosis not present

## 2016-02-03 ENCOUNTER — Ambulatory Visit (INDEPENDENT_AMBULATORY_CARE_PROVIDER_SITE_OTHER): Payer: PPO | Admitting: *Deleted

## 2016-02-03 DIAGNOSIS — I4891 Unspecified atrial fibrillation: Secondary | ICD-10-CM

## 2016-02-03 DIAGNOSIS — Z7901 Long term (current) use of anticoagulants: Secondary | ICD-10-CM | POA: Diagnosis not present

## 2016-02-03 DIAGNOSIS — Z5181 Encounter for therapeutic drug level monitoring: Secondary | ICD-10-CM

## 2016-02-03 LAB — POCT INR: INR: 2.4

## 2016-02-03 NOTE — Progress Notes (Signed)
Pre visit review using our clinic review tool, if applicable. No additional management support is needed unless otherwise documented below in the visit note. 

## 2016-02-05 ENCOUNTER — Ambulatory Visit (INDEPENDENT_AMBULATORY_CARE_PROVIDER_SITE_OTHER): Payer: PPO | Admitting: Podiatry

## 2016-02-05 ENCOUNTER — Encounter: Payer: Self-pay | Admitting: Podiatry

## 2016-02-05 VITALS — BP 138/69 | HR 72 | Resp 12

## 2016-02-05 DIAGNOSIS — L89891 Pressure ulcer of other site, stage 1: Secondary | ICD-10-CM

## 2016-02-05 DIAGNOSIS — L97511 Non-pressure chronic ulcer of other part of right foot limited to breakdown of skin: Secondary | ICD-10-CM

## 2016-02-05 NOTE — Progress Notes (Signed)
Patient ID: Erica Bridges, female   DOB: 09/29/1926, 82 y.o.   MRN: [id number]  Subjective: This patient presents for follow-up visit for a ulcer on the plantar right first MPJ with a history of taking clindamycin 150 mg 4 times a day 10 days and applying topical antibiotic ointment to the area. Patient states that she has 4 more doses of clindamycin Rx'd on the visit of 329 but discontinued because of diarrhea. Patient has discontinue clindamycin and all diarrhea has resolved Patient has scheduled visit for arterial vascular examination 01/06/2016 Wound size distal third right toe on 01/01/2016 was 3 mm  Objective: Orientated 3 DP pulses 0/4 bilaterally PT pulse right palpable PT pulse left nonpalpable Bilateral diffuse rubor bilaterally Plantar right first MPJ has eschar that remains closed after debridement Distal third right toe has 2 mm superficial ulcer with erythema that extends to the distal interphalangeal joint. There is no active drainage, malodor or warmth  Results of lower extremity arterial Doppler January 06 2016 No evidence of segmental lower extremity arterial disease at rest bilaterally Normal ABIs bilaterally Normal bilateral great toe brachial indices   Assessment: Superficial ulcer distal third right toe with low-grade cellulitis that has not progressed without antibiotics from previous visit Vascular examination arterial Doppler for 17 2017 within normal limits GI intolerance to clindamycin  Plan: I reviewed the results of the arterial Doppler dated 01/06/2016 patient today and gave her hard copy of the report Debrided ulcer distal third right and apply Silvadene dressing Patient will continue apply Silvadene dressing and gauze daily Patient will apply topical antibiotic ointment and gauze dressing plantar right first MPJ A surgical shoe was dispensed for to wear when walking, not driviing Spence a silicone toe crest to wear in the third right toe to offload third  right toe Silvadene cream applied daily to the ulcer third right toe   Reappoint 14 days

## 2016-02-05 NOTE — Patient Instructions (Addendum)
Apply Silvadene cream daily to the skin ulcer on the tip of the third right toe and cover with a small amount of gauze Where the silicone toe crest with the reading over the third right toe and no wide portion towards the big toe on a daily basis Wear surgical shoe on right foot when not driving

## 2016-02-06 ENCOUNTER — Other Ambulatory Visit: Payer: Self-pay | Admitting: Family Medicine

## 2016-02-19 ENCOUNTER — Encounter: Payer: Self-pay | Admitting: Podiatry

## 2016-02-19 ENCOUNTER — Ambulatory Visit (INDEPENDENT_AMBULATORY_CARE_PROVIDER_SITE_OTHER): Payer: PPO | Admitting: Podiatry

## 2016-02-19 VITALS — BP 111/72 | HR 76 | Resp 12

## 2016-02-19 DIAGNOSIS — L97511 Non-pressure chronic ulcer of other part of right foot limited to breakdown of skin: Secondary | ICD-10-CM

## 2016-02-19 DIAGNOSIS — L89891 Pressure ulcer of other site, stage 1: Secondary | ICD-10-CM

## 2016-02-19 NOTE — Patient Instructions (Signed)
Apply a small amount of Silvadene cream to the end of third right toe daily Wear the toe prop on the right foot to elevate toes Wear surgical shoe and right foot

## 2016-02-20 NOTE — Progress Notes (Signed)
Patient ID: Erica Bridges, female   DOB: 10-Jul-1927, 82 y.o.   MRN: [id number]  Subjective: Patient presents for follow-up care for ulceration cellulitis third toe right foot. Patient has had local wound care as well as oral antibiotics clindamycin resulting in diarrhea which resolved when the medication was discontinued. Patient currently wearing a silicone toe crest in the third right toe to offload the first MPJ  Objective: Distal third right MPJ superficial ulceration today measuring approximately 1 mm with low-grade erythema edema surrounding the distal interphalangeal joint there is no malodor or active drainage after debridement the base appears to be granular and does not probe deep Lower extremity arterial Doppler for 17 2017 normal ABIs and TBI's  Assessment: Reducing skin ulceration distal third right toe Hammering of third right toe  Plan: Debride distal ulcer third right toe and apply Silvadene dressing Patient will continue apply Silvadene dressing daily Will continue to wear silicone toe prop to elevate third toe right Continue wearing surgical shoe right  Reappoint 2 weeks and take progress x-ray of right foot at the next scheduled visit

## 2016-03-02 ENCOUNTER — Encounter: Payer: Self-pay | Admitting: Gynecology

## 2016-03-02 ENCOUNTER — Ambulatory Visit (INDEPENDENT_AMBULATORY_CARE_PROVIDER_SITE_OTHER): Payer: PPO | Admitting: Gynecology

## 2016-03-02 VITALS — BP 134/76 | Ht 67.0 in | Wt 144.0 lb

## 2016-03-02 DIAGNOSIS — M8580 Other specified disorders of bone density and structure, unspecified site: Secondary | ICD-10-CM

## 2016-03-02 DIAGNOSIS — Z780 Asymptomatic menopausal state: Secondary | ICD-10-CM

## 2016-03-02 DIAGNOSIS — Z8542 Personal history of malignant neoplasm of other parts of uterus: Secondary | ICD-10-CM

## 2016-03-02 DIAGNOSIS — Z1272 Encounter for screening for malignant neoplasm of vagina: Secondary | ICD-10-CM

## 2016-03-02 DIAGNOSIS — Z01419 Encounter for gynecological examination (general) (routine) without abnormal findings: Secondary | ICD-10-CM

## 2016-03-02 NOTE — Progress Notes (Signed)
Erica Bridges June 22, 1927 [id number]   History:    83 y.o.  for annual gyn exam with no complaints today. Patient has not been seen the office since 2014.Patient has a past history of a radical hysterectomy and radiation therapy for uterine cancer (stage?) She was being followed by medical oncology group at [facility] and was scheduled to followup in August of this year but her medical oncologist past moved. She had a normal Pap smear in June of this year and was referred to our GYN oncologist at [facility] Dr. Charlaine Bridges who saw her in 2014 and her Pap smear was normal in 2006 before her radical hysterectomy.Dr. Dyke Maes Pearson's assessment was:"Remote history of endometrial cancer status post surgery and radiation therapy. 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 Patient had a a colonoscopy in 2014 were by benign polyps were removed. Dr. Ria Bush is her primary physician who is been monitoring and treating her for hyperlipidemia and hypertension.  Patient is a normal bone density study in 2014.  Past medical history,surgical history, family history and social history were all reviewed and documented in the EPIC chart.  Gynecologic History No LMP recorded. Patient has had a hysterectomy. Contraception: status post hysterectomy Last Pap:  2014 Results were: normal Last mammogram:  2015Results were: normal  Obstetric History OB History  Gravida Para Term Preterm AB SAB TAB Ectopic Multiple Living  4 3   1 1    3     # Outcome Date GA Lbr Len/2nd Weight Sex Delivery Anes PTL Lv  4 SAB           3 Para           2 Para           1 Para                ROS: A ROS was performed and pertinent positives and negatives are included in the history.  GENERAL: No fevers or chills. HEENT: No change in vision, no earache, sore throat or sinus congestion. NECK: No pain or stiffness. CARDIOVASCULAR: No chest  pain or pressure. No palpitations. PULMONARY: No shortness of breath, cough or wheeze. GASTROINTESTINAL: No abdominal pain, nausea, vomiting or diarrhea, melena or bright red blood per rectum. GENITOURINARY: No urinary frequency, urgency, hesitancy or dysuria. MUSCULOSKELETAL: No joint or muscle pain, no back pain, no recent trauma. DERMATOLOGIC: No rash, no itching, no lesions. ENDOCRINE: No polyuria, polydipsia, no heat or cold intolerance. No recent change in weight. HEMATOLOGICAL: No anemia or easy bruising or bleeding. NEUROLOGIC: No headache, seizures, numbness, tingling or weakness. PSYCHIATRIC: No depression, no loss of interest in normal activity or change in sleep pattern.     Exam: chaperone present  BP 134/76 mmHg  Ht 5\' 7"  (1.702 m)  Wt 144 lb (65.318 kg)  BMI 22.55 kg/m2  Body mass index is 22.55 kg/(m^2).  General appearance : Well developed well nourished female. No acute distress HEENT: Eyes: no retinal hemorrhage or exudates,  Neck supple, trachea midline, no carotid bruits, no thyroidmegaly Lungs: Clear to auscultation, no rhonchi or wheezes, or rib retractions  Heart: Regular rate and rhythm, no murmurs or gallops Breast:Examined in sitting and supine position were symmetrical in appearance, no palpable masses or tenderness,  no skin retraction, no nipple inversion, no nipple discharge, no skin discoloration, no axillary or supraclavicular lymphadenopathy Abdomen: no palpable masses or tenderness, no rebound  or guarding Extremities: no edema or skin discoloration or tenderness  Pelvic:  Bartholin, Urethra, Skene Glands: Within normal limits             Vagina: No gross lesions or discharge, atrophic changes   Cervix absent               uterus: Absent                Without masses or tenderness  Anus and perineum  normal   Rectovaginal  normal sphincter tone without palpated masses or tenderness             Hemoccult Not indicated     Assessment/Plan:  82 y.o.  female for annual exam with a remote history of endometrial cancer post surgery and radiation with no evidence of recurrent disease doing well. Patient will be scheduled for a bone density study here in our office in the next few weeks. She also was given a requisition to schedule for her mammogram which is overdue as well. We discussed importance of monthly breast exam. We discussed importance of calcium vitamin D and weightbearing exercises for osteoporosis prevention. Pap smear was done today. PCP doing her blood work.    Terrance Mass MD, 1:17 PM 03/02/2016

## 2016-03-02 NOTE — Patient Instructions (Signed)

## 2016-03-04 LAB — PAP IG W/ RFLX HPV ASCU

## 2016-03-11 ENCOUNTER — Encounter: Payer: Self-pay | Admitting: Podiatry

## 2016-03-11 ENCOUNTER — Ambulatory Visit (INDEPENDENT_AMBULATORY_CARE_PROVIDER_SITE_OTHER): Payer: PPO | Admitting: Podiatry

## 2016-03-11 VITALS — BP 113/86 | HR 69 | Resp 12

## 2016-03-11 DIAGNOSIS — M79675 Pain in left toe(s): Secondary | ICD-10-CM | POA: Diagnosis not present

## 2016-03-11 DIAGNOSIS — B351 Tinea unguium: Secondary | ICD-10-CM | POA: Diagnosis not present

## 2016-03-11 DIAGNOSIS — L97511 Non-pressure chronic ulcer of other part of right foot limited to breakdown of skin: Secondary | ICD-10-CM

## 2016-03-11 DIAGNOSIS — L89891 Pressure ulcer of other site, stage 1: Secondary | ICD-10-CM

## 2016-03-11 DIAGNOSIS — M79674 Pain in right toe(s): Secondary | ICD-10-CM | POA: Diagnosis not present

## 2016-03-11 NOTE — Patient Instructions (Signed)
Apply Silvadene cream once daily to the skin ulcer on the third right toe and cover with gauze Continue wearing toe prop on right foot Continue wearing surgical shoe on right foot

## 2016-03-12 NOTE — Progress Notes (Signed)
Patient ID: Erica Bridges, female   DOB: September 22, 1926, 82 y.o.   MRN: [id number]  Subjective: Patient presents for follow-up care for ulceration cellulitis third toe right foot. Patient has had local wound care as well as oral antibiotics clindamycin resulting in diarrhea which resolved when the medication was discontinued. Patient currently wearing a silicone toe crest in the third right toe to offload the first MPJ  Objective: Distal third right MPJ superficial ulceration today measuring approximately 1 mm with low-grade erythema edema surrounding the distal interphalangeal joint there is no malodor or active drainage after debridement the base appears to be granular and does not probe deep Lower extremity arterial Doppler for 17 2017 normal ABIs and TBI's Toenails are elongated, brittle, discolored and tender to direct palpation 6-10 Patient not wearing toe crest on right foot  Assessment: Reducing skin ulceration distal third right toe Hammering of third right toe Noncompliance of patient and she's not wearing toe crest Mycotic toenails 6-10   Plan: Debride distal ulcer third right toe and apply Silvadene dressing Patient will continue apply Silvadene dressing daily Will continue to wear silicone toe prop to elevate third toe right Continue wearing surgical shoe right Debridement of toenails 6-10 mechanically and electronically without any bleeding  Reappoint 2 weeks and take progress x-ray of right foot at the next scheduled visit

## 2016-03-16 ENCOUNTER — Other Ambulatory Visit: Payer: PPO

## 2016-03-16 LAB — POCT INR: INR: 4.2

## 2016-03-19 ENCOUNTER — Ambulatory Visit (INDEPENDENT_AMBULATORY_CARE_PROVIDER_SITE_OTHER): Payer: PPO

## 2016-03-19 ENCOUNTER — Other Ambulatory Visit: Payer: Self-pay | Admitting: Gynecology

## 2016-03-19 DIAGNOSIS — Z8542 Personal history of malignant neoplasm of other parts of uterus: Secondary | ICD-10-CM

## 2016-03-19 DIAGNOSIS — M8580 Other specified disorders of bone density and structure, unspecified site: Secondary | ICD-10-CM

## 2016-03-19 DIAGNOSIS — M899 Disorder of bone, unspecified: Secondary | ICD-10-CM

## 2016-03-19 DIAGNOSIS — Z780 Asymptomatic menopausal state: Secondary | ICD-10-CM | POA: Diagnosis not present

## 2016-03-25 ENCOUNTER — Encounter: Payer: Self-pay | Admitting: Podiatry

## 2016-03-25 ENCOUNTER — Ambulatory Visit (INDEPENDENT_AMBULATORY_CARE_PROVIDER_SITE_OTHER): Payer: PPO

## 2016-03-25 ENCOUNTER — Ambulatory Visit (INDEPENDENT_AMBULATORY_CARE_PROVIDER_SITE_OTHER): Payer: PPO | Admitting: Podiatry

## 2016-03-25 VITALS — BP 112/72 | HR 85 | Resp 12

## 2016-03-25 DIAGNOSIS — L89891 Pressure ulcer of other site, stage 1: Secondary | ICD-10-CM

## 2016-03-25 DIAGNOSIS — M79671 Pain in right foot: Secondary | ICD-10-CM

## 2016-03-25 DIAGNOSIS — L97511 Non-pressure chronic ulcer of other part of right foot limited to breakdown of skin: Secondary | ICD-10-CM

## 2016-03-25 NOTE — Progress Notes (Signed)
   Subjective:    Patient ID: Erica Bridges, female    DOB: 18-Mar-1927, 82 y.o.   MRN: [id number]  HPI Present presents for follow-up visit for a skin ulcer on the distal third left toe with even initiated in April 2017. Patient has had ongoing local wound care including repetitive debridement, silicone crest therapy to offload distal toe and surgical shoe. Debridement and application of Silvadene dressing has been continuous.  Patient said history of ulceration plantar right first MPJ that ultimately resolved with local wound care patient is not wearing toe crest that his previous dispensed. She states that she may have lost this since the visit of 03/11/2016  Review of Systems  Skin: Positive for color change.       Objective:   Physical Exam  Orientated 3 DP pulses 0/4 bilaterally PT pulse right trace palpab PT left 1/4 Hammer third right toe Distal third right toe ulceration 3 mm after debridement with abscess in slight serous drainage released Chronic low-grade erythema extends from distal toe to the distal interphalangeal joint Lower extremity arterial Doppler Results of lower extremity arterial Doppler 01/06/2016 No evidence of segmental lower extremity arterial disease at rest bilaterally Normal ABIs bilaterally Normal bilateral great toe brachial indices  X-ray examination weightbearing right foot dated 03/25/2016  Intact bony structures without a fracture dislocation There appears to be no erosion underlying the skin ulcer on the distal third left toe No emphysema noted No bony changes noted from baseline x-ray of 2016  Radiographic impression: No acute bony abnormality noted in the x-ray of the right foot dated 03/25/2016       Assessment & Plan:   Assessment: Hammertoe deformity third left Delayed or nonhealing skin ulcer distal third right toe resulting from trauma Patient on anticoagulant therapy  Plan: Debride ulcer and and apply Silvadene  dressing Dispense replacement toe crest to wear and right patient will continue to apply Silvadene dressing and gauze to the third right toe daily  Continue to wear toe crest right Continue wear surgical shoe right Discuss with patient further treatment options for delayed or nonhealing skin ulcer distal third right toe including consideration of possible surgical treatment. Patient has interested in treatment as the lesion is been persistent and nonhealing.  Patient is referred to Dr. Mayo Ao for follow-up evaluation and treatment 1-2 weeks

## 2016-03-25 NOTE — Patient Instructions (Signed)
Apply Silvadene cream to the skin also in the third right toe daily and cover with gauze Continue wearing toe prop on right foot Them reschedule with Dr. Earleen Newport for consultation treatment for possible surgical consideration of the skin ulcer on the third right toe

## 2016-03-27 ENCOUNTER — Telehealth: Payer: Self-pay

## 2016-03-27 NOTE — Telephone Encounter (Signed)
Patient called to check on pap result from her June visit and also her Bone Density results.  I read her the note on the bone density test result and told her it would be mailed to her.  I informed her pap was normal.

## 2016-03-30 ENCOUNTER — Other Ambulatory Visit (INDEPENDENT_AMBULATORY_CARE_PROVIDER_SITE_OTHER): Payer: PPO

## 2016-03-30 DIAGNOSIS — Z5181 Encounter for therapeutic drug level monitoring: Secondary | ICD-10-CM | POA: Diagnosis not present

## 2016-03-30 DIAGNOSIS — I4891 Unspecified atrial fibrillation: Secondary | ICD-10-CM | POA: Diagnosis not present

## 2016-03-30 DIAGNOSIS — Z7901 Long term (current) use of anticoagulants: Secondary | ICD-10-CM

## 2016-03-30 LAB — POCT INR: INR: 3.2

## 2016-04-03 ENCOUNTER — Other Ambulatory Visit: Payer: Self-pay | Admitting: Family Medicine

## 2016-04-07 ENCOUNTER — Other Ambulatory Visit: Payer: Self-pay | Admitting: Family Medicine

## 2016-04-13 ENCOUNTER — Encounter: Payer: Self-pay | Admitting: Podiatry

## 2016-04-13 ENCOUNTER — Ambulatory Visit (INDEPENDENT_AMBULATORY_CARE_PROVIDER_SITE_OTHER): Payer: PPO | Admitting: Podiatry

## 2016-04-13 DIAGNOSIS — L97511 Non-pressure chronic ulcer of other part of right foot limited to breakdown of skin: Secondary | ICD-10-CM | POA: Diagnosis not present

## 2016-04-13 DIAGNOSIS — L03115 Cellulitis of right lower limb: Secondary | ICD-10-CM

## 2016-04-13 MED ORDER — CLINDAMYCIN HCL 300 MG PO CAPS: 300.0000 mg | ORAL_CAPSULE | Freq: Three times a day (TID) | ORAL | 0 refills | Status: DC

## 2016-04-15 ENCOUNTER — Other Ambulatory Visit (INDEPENDENT_AMBULATORY_CARE_PROVIDER_SITE_OTHER): Payer: PPO

## 2016-04-15 DIAGNOSIS — Z7901 Long term (current) use of anticoagulants: Secondary | ICD-10-CM | POA: Diagnosis not present

## 2016-04-15 DIAGNOSIS — I4891 Unspecified atrial fibrillation: Secondary | ICD-10-CM

## 2016-04-15 DIAGNOSIS — Z5181 Encounter for therapeutic drug level monitoring: Secondary | ICD-10-CM

## 2016-04-15 LAB — POCT INR: INR: 2.3

## 2016-04-19 NOTE — Progress Notes (Signed)
Subjective: 82 year old female presents the office today for concerns of her right third toe. She has no infection to this toe as well as a wound which is healing. She doesn't she still has some redness of the toe. She is going on taking any antibiotics. She is referred to me by Dr. Amalia Hailey for possible surgical intervention. Denies any systemic complaints such as fevers, chills, nausea, vomiting. No acute changes since last appointment, and no other complaints at this time.   Objective: AAO x3, NAD DP/PT pulses decreased bilaterally, CRT less than 3 seconds The distal aspect of the right third toe is a hyperkeratotic lesion. Upon debridement very superficial abrasion type lesion is present. There is some mild localized edema and erythema to the very distal aspect of the toe with ascending cellulitis. No drainage or pus. No malodor. Toe does sit in a flexion contraction position. No edema, erythema, increase in warmth to bilateral lower extremities.  No open lesions or pre-ulcerative lesions.  No pain with calf compression, swelling, warmth, erythema  Assessment: Right third toe cellulitis  Plan: -All treatment options discussed with the patient including all alternatives, risks, complications.  -At this time I recommended to go back on antibiotics given the erythema and edema. Started doxycycline. Hyperkeratotic lesion was debrided without complications. Continue with Neosporin dressing changes daily. Monitor for signs or symptoms of worsening infection and directed to the ER should any occur. Offloading pads were dispensed. I'll see back in 2 weeks or sooner if any issues are to arise. -Patient encouraged to call the office with any questions, concerns, change in symptoms.   Celesta Gentile, DPM

## 2016-04-27 ENCOUNTER — Encounter: Payer: Self-pay | Admitting: Podiatry

## 2016-04-27 ENCOUNTER — Ambulatory Visit (INDEPENDENT_AMBULATORY_CARE_PROVIDER_SITE_OTHER): Payer: PPO | Admitting: Podiatry

## 2016-04-27 DIAGNOSIS — L97511 Non-pressure chronic ulcer of other part of right foot limited to breakdown of skin: Secondary | ICD-10-CM

## 2016-04-27 DIAGNOSIS — L03115 Cellulitis of right lower limb: Secondary | ICD-10-CM | POA: Diagnosis not present

## 2016-04-27 NOTE — Progress Notes (Signed)
Subjective: 82 year old female presents the office today for follow-up evaluation of her right third toe. She states that she is doing better. She is finished her course of doxycycline. She denies any swelling or redness to her toe. Denies any systemic complaints such as fevers, chills, nausea, vomiting. No acute changes since last appointment, and no other complaints at this time.   Objective: AAO x3, NAD DP/PT pulses decreased bilaterally, CRT less than 3 seconds The distal aspect of the right third toe is a hyperkeratotic lesion. Upon debridement there is no underlying ulceration, drainage. There is faint edema and very minimal erythema to the distal aspect of the toe. This appears to be greatly improved her last appointment. There is no ascending synovitis. No fluctuance or crepitus. There is no malodor.  Hammertoe contractures No edema, erythema, increase in warmth to bilateral lower extremities.  No open lesions or pre-ulcerative lesions.  No pain with calf compression, swelling, warmth, erythema  Assessment: Right third toe resolving cellulitis  Plan: -All treatment options discussed with the patient including all alternatives, risks, complications.  -Pre-ulcerative lesion was debrided to without complications or bleeding. Recommended continue offloading pads for now. I'll see back in 6 weeks. I want to ensure that before proceeding with any surgical invention of the infection remains resolved. She decrease this plan. -Monitor for signs or symptoms of worsening infection and directed to the ER should any occur. Offloading pads were dispensed. -Patient encouraged to call the office with any questions, concerns, change in symptoms.   Celesta Gentile, DPM

## 2016-05-15 ENCOUNTER — Encounter: Payer: Self-pay | Admitting: Podiatry

## 2016-05-15 ENCOUNTER — Ambulatory Visit (INDEPENDENT_AMBULATORY_CARE_PROVIDER_SITE_OTHER): Payer: PPO | Admitting: Podiatry

## 2016-05-15 DIAGNOSIS — L84 Corns and callosities: Secondary | ICD-10-CM

## 2016-05-15 DIAGNOSIS — M2041 Other hammer toe(s) (acquired), right foot: Secondary | ICD-10-CM | POA: Diagnosis not present

## 2016-05-21 ENCOUNTER — Other Ambulatory Visit (INDEPENDENT_AMBULATORY_CARE_PROVIDER_SITE_OTHER): Payer: PPO

## 2016-05-21 DIAGNOSIS — Z7901 Long term (current) use of anticoagulants: Secondary | ICD-10-CM | POA: Diagnosis not present

## 2016-05-21 DIAGNOSIS — I4891 Unspecified atrial fibrillation: Secondary | ICD-10-CM | POA: Diagnosis not present

## 2016-05-21 DIAGNOSIS — Z5181 Encounter for therapeutic drug level monitoring: Secondary | ICD-10-CM

## 2016-05-21 LAB — POCT INR: INR: 1.7

## 2016-05-25 NOTE — Progress Notes (Signed)
Subjective: 82 year old female presents the office today for follow-up evaluation of her right third toe. She says the toe is nontender and better and still contracted but the redness has improved. She has not been using any offloading pads or any other treatments that she has not tried change his shoes to help take pressure off the area. Denies any systemic complaints such as fevers, chills, nausea, vomiting. No acute changes since last appointment, and no other complaints at this time.   Objective: AAO x3, NAD DP/PT pulses decreased bilaterally, CRT less than 3 seconds The distal aspect of the right third toe is a hyperkeratotic lesion. At this time there is no underlying ulceration, drainage or other signs of infection. There is hammertoe contracture present. There is resolved erythema and edema to the right third toe. There is no other open lesions or pre-lesion lesions identified at this time. No edema, erythema, increase in warmth to bilateral lower extremities.  No pain with calf compression, swelling, warmth, erythema  Assessment: Right third toe resolving cellulitis  Plan: -All treatment options discussed with the patient including all alternatives, risks, complications.  -Hyperkeratotic lesion debrided without couple complications or bleeding. I dispensed offloading To help protect the toe. If symptoms continue discussed further arthroplasty of the toe. -Discussed shoe modifications well. -Follow up as scheduled or sooner if needed. Call any questions or concerns in the meantime  Celesta Gentile, DPM

## 2016-06-11 ENCOUNTER — Other Ambulatory Visit (INDEPENDENT_AMBULATORY_CARE_PROVIDER_SITE_OTHER): Payer: PPO

## 2016-06-11 ENCOUNTER — Ambulatory Visit (INDEPENDENT_AMBULATORY_CARE_PROVIDER_SITE_OTHER): Payer: PPO

## 2016-06-11 DIAGNOSIS — I4891 Unspecified atrial fibrillation: Secondary | ICD-10-CM

## 2016-06-11 DIAGNOSIS — Z7901 Long term (current) use of anticoagulants: Secondary | ICD-10-CM

## 2016-06-11 DIAGNOSIS — Z23 Encounter for immunization: Secondary | ICD-10-CM | POA: Diagnosis not present

## 2016-06-11 DIAGNOSIS — Z5181 Encounter for therapeutic drug level monitoring: Secondary | ICD-10-CM

## 2016-06-11 LAB — POCT INR: INR: 2.3

## 2016-06-19 ENCOUNTER — Encounter: Payer: Self-pay | Admitting: Podiatry

## 2016-06-19 ENCOUNTER — Ambulatory Visit (INDEPENDENT_AMBULATORY_CARE_PROVIDER_SITE_OTHER): Payer: PPO | Admitting: Podiatry

## 2016-06-19 DIAGNOSIS — L84 Corns and callosities: Secondary | ICD-10-CM | POA: Diagnosis not present

## 2016-06-19 DIAGNOSIS — M2041 Other hammer toe(s) (acquired), right foot: Secondary | ICD-10-CM

## 2016-06-19 DIAGNOSIS — M2010 Hallux valgus (acquired), unspecified foot: Secondary | ICD-10-CM | POA: Diagnosis not present

## 2016-06-19 NOTE — Progress Notes (Signed)
Subjective: 82 year old female presents the office today for follow-up evaluation of her right third toe. The toe is doing much better. She also is a callus in the right foot submetatarsal area which is also painful. She has hammertoes. She also has a bunion that she states is painful intermittently. She said no other treatment for this. No other complaints. She denies he swelling or redness or drainage.  Objective: AAO x3, NAD DP/PT pulses decreased bilaterally, CRT less than 3 seconds The distal aspect of the right third toe is a hyperkeratotic lesion, although much improved compared to last opponent. Upon debridement no underlying ulceration, drainage or signs of infection. Some metatarsal hyperkeratotic lesion. No underlying ulceration identified this and infection. Prominent metatarsal heads plantarly with approximately the fat pad. Hammertoes are present. HAV is present. Other areas of tenderness. No pain with calf compression, swelling, warmth, erythema  Assessment: Right third toe resolving ulcer/infection with pre-ulcerative lesion, digital deformities.  Plan: -All treatment options discussed with the patient including all alternatives, risks, complications.  -Hyperkeratotic lesions were debrided today. I do recommend that should have accommodative insert with N help take pressure off these areas to help prevent any reoccurrence. She wishes to proceed. She was measured for the inserts and they were sent off to be made. We'll get a diabetic type accommodative insert. She is aware of the cost. -f/u in 4 weeks  Celesta Gentile, DPM

## 2016-07-09 ENCOUNTER — Other Ambulatory Visit (INDEPENDENT_AMBULATORY_CARE_PROVIDER_SITE_OTHER): Payer: PPO

## 2016-07-09 DIAGNOSIS — Z5181 Encounter for therapeutic drug level monitoring: Secondary | ICD-10-CM

## 2016-07-09 DIAGNOSIS — I4891 Unspecified atrial fibrillation: Secondary | ICD-10-CM | POA: Diagnosis not present

## 2016-07-09 DIAGNOSIS — Z7901 Long term (current) use of anticoagulants: Secondary | ICD-10-CM | POA: Diagnosis not present

## 2016-07-09 LAB — POCT INR: INR: 1.8

## 2016-07-10 ENCOUNTER — Telehealth: Payer: Self-pay

## 2016-07-10 NOTE — Telephone Encounter (Signed)
Pt left v/m requesting refill atenolol to [facility] [place], I spoke with Judson Roch at [facility] and atenolol 50 mg is on back order. Judson Roch said can give pt 100 mg and cut in half. Pt will call Judson Roch and order the atenolol 100 mg and cut pill in half. Nothing further needed.

## 2016-07-13 ENCOUNTER — Ambulatory Visit (INDEPENDENT_AMBULATORY_CARE_PROVIDER_SITE_OTHER): Payer: PPO | Admitting: Podiatry

## 2016-07-13 ENCOUNTER — Encounter: Payer: Self-pay | Admitting: Podiatry

## 2016-07-13 DIAGNOSIS — M2010 Hallux valgus (acquired), unspecified foot: Secondary | ICD-10-CM

## 2016-07-13 DIAGNOSIS — L84 Corns and callosities: Secondary | ICD-10-CM

## 2016-07-13 NOTE — Patient Instructions (Signed)

## 2016-07-13 NOTE — Progress Notes (Signed)
Patient presents to [facility]- seen by Cranford Mon, CMA. Declined to be seen by myself. Oral and written break-in instructions discussed. Follow-up in 4 weeks or sooner if needed.

## 2016-07-30 ENCOUNTER — Other Ambulatory Visit (INDEPENDENT_AMBULATORY_CARE_PROVIDER_SITE_OTHER): Payer: PPO

## 2016-07-30 DIAGNOSIS — I4891 Unspecified atrial fibrillation: Secondary | ICD-10-CM | POA: Diagnosis not present

## 2016-07-30 DIAGNOSIS — Z7901 Long term (current) use of anticoagulants: Secondary | ICD-10-CM

## 2016-07-30 DIAGNOSIS — Z5181 Encounter for therapeutic drug level monitoring: Secondary | ICD-10-CM | POA: Diagnosis not present

## 2016-07-30 LAB — POCT INR: INR: 2

## 2016-08-03 ENCOUNTER — Ambulatory Visit (INDEPENDENT_AMBULATORY_CARE_PROVIDER_SITE_OTHER): Payer: PPO | Admitting: Primary Care

## 2016-08-03 ENCOUNTER — Encounter: Payer: Self-pay | Admitting: Primary Care

## 2016-08-03 ENCOUNTER — Other Ambulatory Visit: Payer: Self-pay | Admitting: Primary Care

## 2016-08-03 VITALS — BP 114/76 | HR 85 | Temp 97.4°F | Ht 67.0 in | Wt 133.4 lb

## 2016-08-03 DIAGNOSIS — R197 Diarrhea, unspecified: Secondary | ICD-10-CM

## 2016-08-03 DIAGNOSIS — R112 Nausea with vomiting, unspecified: Secondary | ICD-10-CM | POA: Diagnosis not present

## 2016-08-03 DIAGNOSIS — K5792 Diverticulitis of intestine, part unspecified, without perforation or abscess without bleeding: Secondary | ICD-10-CM

## 2016-08-03 LAB — CBC WITH DIFFERENTIAL/PLATELET
BASOS PCT: 0.2 % (ref 0.0–3.0)
Basophils Absolute: 0 10*3/uL (ref 0.0–0.1)
EOS PCT: 0.6 % (ref 0.0–5.0)
Eosinophils Absolute: 0.1 10*3/uL (ref 0.0–0.7)
HCT: 46.8 % — ABNORMAL HIGH (ref 36.0–46.0)
HEMOGLOBIN: 15.1 g/dL — AB (ref 12.0–15.0)
LYMPHS ABS: 1.1 10*3/uL (ref 0.7–4.0)
Lymphocytes Relative: 9.1 % — ABNORMAL LOW (ref 12.0–46.0)
MCHC: 32.2 g/dL (ref 30.0–36.0)
MCV: 90.2 fl (ref 78.0–100.0)
MONO ABS: 1.2 10*3/uL — AB (ref 0.1–1.0)
Monocytes Relative: 10.4 % (ref 3.0–12.0)
Neutro Abs: 9.3 10*3/uL — ABNORMAL HIGH (ref 1.4–7.7)
Neutrophils Relative %: 79.7 % — ABNORMAL HIGH (ref 43.0–77.0)
Platelets: 165 10*3/uL (ref 150.0–400.0)
RBC: 5.19 Mil/uL — ABNORMAL HIGH (ref 3.87–5.11)
RDW: 14.3 % (ref 11.5–15.5)
WBC: 11.6 10*3/uL — AB (ref 4.0–10.5)

## 2016-08-03 LAB — BASIC METABOLIC PANEL
BUN: 34 mg/dL — AB (ref 6–23)
CALCIUM: 10.3 mg/dL (ref 8.4–10.5)
CO2: 33 mEq/L — ABNORMAL HIGH (ref 19–32)
Chloride: 95 mEq/L — ABNORMAL LOW (ref 96–112)
Creatinine, Ser: 1.17 mg/dL (ref 0.40–1.20)
GFR: 46.47 mL/min — AB (ref >60.00)
GLUCOSE: 102 mg/dL — AB (ref 70–99)
Potassium: 4 mEq/L (ref 3.5–5.1)
SODIUM: 137 meq/L (ref 135–145)

## 2016-08-03 MED ORDER — METRONIDAZOLE 500 MG PO TABS: 500.0000 mg | ORAL_TABLET | Freq: Three times a day (TID) | ORAL | 0 refills | Status: DC

## 2016-08-03 MED ORDER — CIPROFLOXACIN HCL 250 MG PO TABS: 250.0000 mg | ORAL_TABLET | Freq: Two times a day (BID) | ORAL | 0 refills | Status: DC

## 2016-08-03 NOTE — Patient Instructions (Signed)
Complete lab work prior to leaving today. I will notify you of your results once received.   Start advancing your diet and fluids as tolerated. Take a look at the information below.  Please notify me if you develop increased abdominal pain, fevers, you restart vomiting and/or diarrhea.  It was a pleasure meeting you!   Food Choices to Help Relieve Diarrhea, Adult When you have diarrhea, the foods you eat and your eating habits are very important. Choosing the right foods and drinks can help relieve diarrhea. Also, because diarrhea can last up to 7 days, you need to replace lost fluids and electrolytes (such as sodium, potassium, and chloride) in order to help prevent dehydration.  WHAT GENERAL GUIDELINES DO I NEED TO FOLLOW?  Slowly drink 1 cup (8 oz) of fluid for each episode of diarrhea. If you are getting enough fluid, your urine will be clear or pale yellow.  Eat starchy foods. Some good choices include white rice, white toast, pasta, low-fiber cereal, baked potatoes (without the skin), saltine crackers, and bagels.  Avoid large servings of any cooked vegetables.  Limit fruit to two servings per day. A serving is  cup or 1 small piece.  Choose foods with less than 2 g of fiber per serving.  Limit fats to less than 8 tsp (38 g) per day.  Avoid fried foods.  Eat foods that have probiotics in them. Probiotics can be found in certain dairy products.  Avoid foods and beverages that may increase the speed at which food moves through the stomach and intestines (gastrointestinal tract). Things to avoid include:  High-fiber foods, such as dried fruit, raw fruits and vegetables, nuts, seeds, and whole grain foods.  Spicy foods and high-fat foods.  Foods and beverages sweetened with high-fructose corn syrup, honey, or sugar alcohols such as xylitol, sorbitol, and mannitol. WHAT FOODS ARE RECOMMENDED? Grains White rice. White, [place], or pita breads (fresh or toasted), including plain  rolls, buns, or bagels. White pasta. Saltine, soda, or graham crackers. Pretzels. Low-fiber cereal. Cooked cereals made with water (such as cornmeal, farina, or cream cereals). Plain muffins. Matzo. Melba toast. Zwieback.  Vegetables Potatoes (without the skin). Strained tomato and vegetable juices. Most well-cooked and canned vegetables without seeds. Tender lettuce. Fruits Cooked or canned applesauce, apricots, cherries, fruit cocktail, grapefruit, peaches, pears, or plums. Fresh bananas, apples without skin, cherries, grapes, cantaloupe, grapefruit, peaches, oranges, or plums.  Meat and Other Protein Products Baked or boiled chicken. Eggs. Tofu. Fish. Seafood. Smooth peanut butter. Ground or well-cooked tender beef, ham, veal, lamb, pork, or poultry.  Dairy Plain yogurt, kefir, and unsweetened liquid yogurt. Lactose-free milk, buttermilk, or soy milk. Plain hard cheese. Beverages Sport drinks. Clear broths. Diluted fruit juices (except prune). Regular, caffeine-free sodas such as ginger ale. Water. Decaffeinated teas. Oral rehydration solutions. Sugar-free beverages not sweetened with sugar alcohols. Other Bouillon, broth, or soups made from recommended foods.  The items listed above may not be a complete list of recommended foods or beverages. Contact your dietitian for more options. WHAT FOODS ARE NOT RECOMMENDED? Grains Whole grain, whole wheat, bran, or rye breads, rolls, pastas, crackers, and cereals. Wild or brown rice. Cereals that contain more than 2 g of fiber per serving. Corn tortillas or taco shells. Cooked or dry oatmeal. Granola. Popcorn. Vegetables Raw vegetables. Cabbage, broccoli, Brussels sprouts, artichokes, baked beans, beet greens, corn, kale, legumes, peas, sweet potatoes, and yams. Potato skins. Cooked spinach and cabbage. Fruits Dried fruit, including raisins and dates. Raw fruits.  Stewed or dried prunes. Fresh apples with skin, apricots, mangoes, pears, raspberries,  and strawberries.  Meat and Other Protein Products Chunky peanut butter. Nuts and seeds. Beans and lentils. Berniece Salines.  Dairy High-fat cheeses. Milk, chocolate milk, and beverages made with milk, such as milk shakes. Cream. Ice cream. Sweets and Desserts Sweet rolls, doughnuts, and sweet breads. Pancakes and waffles. Fats and Oils Butter. Cream sauces. Margarine. Salad oils. Plain salad dressings. Olives. Avocados.  Beverages Caffeinated beverages (such as coffee, tea, soda, or energy drinks). Alcoholic beverages. Fruit juices with pulp. Prune juice. Soft drinks sweetened with high-fructose corn syrup or sugar alcohols. Other Coconut. Hot sauce. Chili powder. Mayonnaise. Gravy. Cream-based or milk-based soups.  The items listed above may not be a complete list of foods and beverages to avoid. Contact your dietitian for more information. WHAT SHOULD I DO IF I BECOME DEHYDRATED? Diarrhea can sometimes lead to dehydration. Signs of dehydration include dark urine and dry mouth and skin. If you think you are dehydrated, you should rehydrate with an oral rehydration solution. These solutions can be purchased at pharmacies, retail stores, or online.  Drink -1 cup (120-240 mL) of oral rehydration solution each time you have an episode of diarrhea. If drinking this amount makes your diarrhea worse, try drinking smaller amounts more often. For example, drink 1-3 tsp (5-15 mL) every 5-10 minutes.  A general rule for staying hydrated is to drink 1-2 L of fluid per day. Talk to your health care provider about the specific amount you should be drinking each day. Drink enough fluids to keep your urine clear or pale yellow.   This information is not intended to replace advice given to you by your health care provider. Make sure you discuss any questions you have with your health care provider.   Document Released: 11/28/2003 Document Revised: 09/28/2014 Document Reviewed: 07/31/2013 Elsevier Interactive Patient  Education [employer].

## 2016-08-03 NOTE — Progress Notes (Signed)
Subjective:    Patient ID: Erica Bridges, female    DOB: 1927/04/11, 82 y.o.   MRN: [id number]  HPI  Erica Bridges is an 82 year old female with a history of diverticulosis of the colon, reactive airway disease who presents today with a chief complaint of abdominal pain. She also reports diarrhea, nausea, vomiting, nasal congestion, sinus pressure. Her symptoms of headache and sinus pressure began Saturday this weekend and has since dissipated. She woke up at 3 am on Sunday morning with abdominal pain to the periumbilical region. She had a normal stool at 3 am. She drank several cups of coffee and ate several prunes several hours later, then experienced 1 episode of diarrhea and vomiting. She's not had any vomiting or diarrhea since. Her abdominal pain has greatly improved since this morning.   She denies fevers, nausea, vomiting, bloody stools, cough, headaches, urinary symptoms. She's not eaten anything yesterday and today. She's not had her medication since Saturday night. She drank hot tea this morning without any diarrhea/vomiting. She's been around her grandson last week who had similar symptoms. Overall she's feeling better. She's not taken anything OTC for her symptoms.  Review of Systems  Constitutional: Negative for chills, fatigue and fever.  HENT: Negative for sinus pressure.   Respiratory: Negative for cough and shortness of breath.   Cardiovascular: Negative for chest pain.  Gastrointestinal: Positive for abdominal pain, diarrhea, nausea and vomiting. Negative for constipation.  Genitourinary: Negative for dysuria and frequency.  Neurological: Negative for headaches.       Past Medical History:  Diagnosis Date  . Asthma   . Atrial fibrillation ([place])   . Benign neoplasm of colon 10/22/2005   Hyperplastic  & Adenomatous polyps   . Cardiomegaly 2013   by xray  . Diverticulosis of colon (without mention of hemorrhage)   . External hemorrhoids without mention of complication   .  HLD (hyperlipidemia)   . HTN (hypertension)   . Malignant neoplasm of corpus uteri, except isthmus ([place])    followed by [name] with yearly visits  . Onychia and paronychia of toe   . Osteopenia 2017   by xray, but DEXA WNL 2014  . Pneumonia   . Polycythemia 11/2011   normal EPO and periph smear     Social History   Social History  . Marital status: Married    Spouse name: N/A  . Number of children: 3  . Years of education: N/A   Occupational History  . Retired Radiation protection practitioner    Social History Main Topics  . Smoking status: Former Smoker    Types: Cigarettes    Quit date: 09/21/1982  . Smokeless tobacco: Never Used  . Alcohol use No  . Drug use: No  . Sexual activity: Not on file   Other Topics Concern  . Not on file   Social History Narrative   Retired, widow   One son Janeece Riggers - part time post office), one daughter ([place] Engineer, mining), other daughter Janeece Riggers)   Daily caffeine 1+    Past Surgical History:  Procedure Laterality Date  . CATARACT EXTRACTION Bilateral   . COLONOSCOPY  10/22/05   polyps-bx negative; divertics, ext hemmorhoids  . COLONOSCOPY  03/2013   tubular adenomas, small angiodysplastic lesion at cecum, mod diverticulosis Carlean Purl)  . DEXA  2014   WNL  . GYNECOLOGIC CRYOSURGERY    . LUMBAR DISC SURGERY  1970s  . REFRACTIVE SURGERY Bilateral   . TONSILLECTOMY    . TOTAL  ABDOMINAL HYSTERECTOMY W/ BILATERAL SALPINGOOPHORECTOMY  2005   uterine adenocarcinoma    Family History  Problem Relation Age of Onset  . Hypertension Father   . Stroke Father   . Diabetes Mother   . Coronary artery disease Mother   . Hypertension Mother   . Colon cancer Neg Hx     Allergies  Allergen Reactions  . Amoxicillin     REACTION: rashj  . Penicillins     REACTION: rash    Current Outpatient Prescriptions on File Prior to Visit  Medication Sig Dispense Refill  . atenolol (TENORMIN) 50 MG tablet TAKE 1 TABLET (50 MG TOTAL) BY MOUTH  DAILY. 90 tablet 0  . BIOTIN PO Take 1 tablet by mouth daily. Reported on 01/17/2016    . cholecalciferol (VITAMIN D) 1000 UNITS tablet Take 1,000 Units by mouth daily.    . clindamycin (CLEOCIN) 300 MG capsule Take 1 capsule (300 mg total) by mouth 3 (three) times daily. 30 capsule 0  . Coenzyme Q10 200 MG capsule Take 200 mg by mouth daily.    Vladimir Faster Glycol-Propyl Glycol (SYSTANE) 0.4-0.3 % SOLN Apply 1 drop to eye 4 (four) times daily as needed.     . potassium chloride SA (K-DUR,KLOR-CON) 10 MEQ tablet Take 1 tablet (10 mEq total) by mouth daily. 90 tablet 3  . pravastatin (PRAVACHOL) 40 MG tablet Take 1 tablet (40 mg total) by mouth daily. 90 tablet 3  . triamterene-hydrochlorothiazide (MAXZIDE-25) 37.5-25 MG tablet Take 1 tablet by mouth daily. 90 tablet 3  . warfarin (COUMADIN) 5 MG tablet TAKE AS DIRECTED BY ANTI- COAGULATION CLINIC. 75 tablet 1   No current facility-administered medications on file prior to visit.     BP 114/76   Pulse 85   Temp 97.4 F (36.3 C) (Oral)   Ht 5\' 7"  (1.702 m)   Wt 133 lb 7.2 oz (60.5 kg)   SpO2 90%   BMI 20.90 kg/m    Objective:   Physical Exam  Constitutional: She appears well-nourished.  HENT:  Right Ear: Tympanic membrane and ear canal normal.  Left Ear: Tympanic membrane and ear canal normal.  Nose: Right sinus exhibits no maxillary sinus tenderness and no frontal sinus tenderness. Left sinus exhibits no maxillary sinus tenderness and no frontal sinus tenderness.  Mouth/Throat: Oropharynx is clear and moist.  Eyes: Conjunctivae are normal.  Neck: Neck supple.  Cardiovascular: Normal rate and regular rhythm.   Pulmonary/Chest: Effort normal and breath sounds normal. She has no rales.  Abdominal: Soft. Bowel sounds are normal. There is tenderness in the right lower quadrant and periumbilical area. There is no rebound and no tenderness at McBurney's point.  Lymphadenopathy:    She has no cervical adenopathy.  Skin: Skin is warm and  dry.          Assessment & Plan:  Abdominal Pain:  Also with 1 episode of diarrhea and vomiting Sunday morning. Pain to periumbilical region. Overall feeling much better. Exam with mild tenderness to periumbilical and RLQ. Does not appear sickly/ill. Vitals stable, does not appear acutely dehydrated. Suspect viral GI involvement, especially given interaction with grandson who had same symptoms. Will check CBC and CMP today to rule out any other cause. Discussed to advance diet and fluids as tolerated. Provided information regarding BRAT diet. Return precautions provided.  Sheral Flow, NP

## 2016-08-03 NOTE — Progress Notes (Signed)
Pre visit review using our clinic review tool, if applicable. No additional management support is needed unless otherwise documented below in the visit note. 

## 2016-08-04 MED ORDER — METRONIDAZOLE 500 MG PO TABS: 500.0000 mg | ORAL_TABLET | Freq: Three times a day (TID) | ORAL | 0 refills | Status: DC

## 2016-08-04 MED ORDER — CIPROFLOXACIN HCL 250 MG PO TABS: 250.0000 mg | ORAL_TABLET | Freq: Two times a day (BID) | ORAL | 0 refills | Status: DC

## 2016-08-07 ENCOUNTER — Telehealth: Payer: Self-pay | Admitting: Family Medicine

## 2016-08-07 ENCOUNTER — Emergency Department
Admission: EM | Admit: 2016-08-07 | Discharge: 2016-08-07 | Disposition: A | Discharge status: Home / Self Care | Payer: PPO | Attending: Emergency Medicine | Admitting: Emergency Medicine

## 2016-08-07 ENCOUNTER — Encounter: Payer: Self-pay | Admitting: Emergency Medicine

## 2016-08-07 ENCOUNTER — Emergency Department: Payer: PPO

## 2016-08-07 ENCOUNTER — Ambulatory Visit: Payer: PPO | Admitting: Family Medicine

## 2016-08-07 DIAGNOSIS — R109 Unspecified abdominal pain: Secondary | ICD-10-CM | POA: Insufficient documentation

## 2016-08-07 DIAGNOSIS — Z79899 Other long term (current) drug therapy: Secondary | ICD-10-CM | POA: Insufficient documentation

## 2016-08-07 DIAGNOSIS — Z7901 Long term (current) use of anticoagulants: Secondary | ICD-10-CM | POA: Insufficient documentation

## 2016-08-07 DIAGNOSIS — I10 Essential (primary) hypertension: Secondary | ICD-10-CM | POA: Diagnosis not present

## 2016-08-07 DIAGNOSIS — R0981 Nasal congestion: Secondary | ICD-10-CM | POA: Diagnosis present

## 2016-08-07 DIAGNOSIS — J441 Chronic obstructive pulmonary disease with (acute) exacerbation: Secondary | ICD-10-CM | POA: Diagnosis not present

## 2016-08-07 DIAGNOSIS — Z87891 Personal history of nicotine dependence: Secondary | ICD-10-CM | POA: Diagnosis not present

## 2016-08-07 DIAGNOSIS — Z8542 Personal history of malignant neoplasm of other parts of uterus: Secondary | ICD-10-CM | POA: Insufficient documentation

## 2016-08-07 DIAGNOSIS — K921 Melena: Secondary | ICD-10-CM | POA: Diagnosis not present

## 2016-08-07 DIAGNOSIS — J45909 Unspecified asthma, uncomplicated: Secondary | ICD-10-CM | POA: Insufficient documentation

## 2016-08-07 DIAGNOSIS — R197 Diarrhea, unspecified: Secondary | ICD-10-CM | POA: Diagnosis not present

## 2016-08-07 LAB — COMPREHENSIVE METABOLIC PANEL
ALK PHOS: 51 U/L (ref 38–126)
ALT: 17 U/L (ref 14–54)
ANION GAP: 9 (ref 5–15)
AST: 34 U/L (ref 15–41)
Albumin: 3.5 g/dL (ref 3.5–5.0)
BUN: 27 mg/dL — ABNORMAL HIGH (ref 6–20)
CALCIUM: 9.7 mg/dL (ref 8.9–10.3)
CO2: 30 mmol/L (ref 22–32)
CREATININE: 1.16 mg/dL — AB (ref 0.44–1.00)
Chloride: 96 mmol/L — ABNORMAL LOW (ref 101–111)
GFR, EST AFRICAN AMERICAN: 48 mL/min — AB (ref >60)
GFR, EST NON AFRICAN AMERICAN: 41 mL/min — AB (ref >60)
Glucose, Bld: 81 mg/dL (ref 65–99)
Potassium: 3.9 mmol/L (ref 3.5–5.1)
Sodium: 135 mmol/L (ref 135–145)
Total Bilirubin: 0.9 mg/dL (ref 0.3–1.2)
Total Protein: 6.6 g/dL (ref 6.5–8.1)

## 2016-08-07 LAB — PROTIME-INR
INR: 2.38
Prothrombin Time: 26.4 seconds — ABNORMAL HIGH (ref 11.4–15.2)

## 2016-08-07 LAB — CBC WITH DIFFERENTIAL/PLATELET
Basophils Absolute: 0.1 10*3/uL (ref 0–0.1)
Basophils Relative: 1 %
Eosinophils Absolute: 0.1 10*3/uL (ref 0–0.7)
Eosinophils Relative: 1 %
HCT: 46.3 % (ref 35.0–47.0)
Hemoglobin: 15 g/dL (ref 12.0–16.0)
Lymphocytes Relative: 12 %
Lymphs Abs: 0.9 10*3/uL — ABNORMAL LOW (ref 1.0–3.6)
MCH: 28.8 pg (ref 26.0–34.0)
MCHC: 32.3 g/dL (ref 32.0–36.0)
MCV: 89.3 fL (ref 80.0–100.0)
Monocytes Absolute: 1.3 10*3/uL — ABNORMAL HIGH (ref 0.2–0.9)
Monocytes Relative: 17 %
Neutro Abs: 5.2 10*3/uL (ref 1.4–6.5)
Neutrophils Relative %: 69 %
Platelets: 169 10*3/uL (ref 150–440)
RBC: 5.19 MIL/uL (ref 3.80–5.20)
RDW: 13.7 % (ref 11.5–14.5)
WBC: 7.5 10*3/uL (ref 3.6–11.0)

## 2016-08-07 LAB — TYPE AND SCREEN
ABO/RH(D): B POS
ANTIBODY SCREEN: NEGATIVE

## 2016-08-07 LAB — TROPONIN I
Troponin I: <0.03 ng/mL
Troponin I: <0.03 ng/mL

## 2016-08-07 MED ORDER — ATENOLOL 25 MG PO TABS
50.0000 mg | ORAL_TABLET | Freq: Once | ORAL | Status: AC
  Administered 2016-08-07: 50 mg via ORAL
  Filled 2016-08-07: qty 2

## 2016-08-07 MED ORDER — PREDNISONE 20 MG PO TABS: 20.0000 mg | ORAL_TABLET | Freq: Every day | ORAL | 0 refills | Status: DC

## 2016-08-07 MED ORDER — SODIUM CHLORIDE 0.9 % IV BOLUS (SEPSIS)
1000.0000 mL | Freq: Once | INTRAVENOUS | Status: AC
  Administered 2016-08-07: 1000 mL via INTRAVENOUS

## 2016-08-07 MED ORDER — IPRATROPIUM-ALBUTEROL 0.5-2.5 (3) MG/3ML IN SOLN
3.0000 mL | Freq: Once | RESPIRATORY_TRACT | Status: AC
  Administered 2016-08-07: 3 mL via RESPIRATORY_TRACT
  Filled 2016-08-07: qty 3

## 2016-08-07 MED ORDER — IPRATROPIUM-ALBUTEROL 0.5-2.5 (3) MG/3ML IN SOLN
3.0000 mL | Freq: Once | RESPIRATORY_TRACT | Status: AC
Start: 2016-08-07 — End: 2016-08-07
  Administered 2016-08-07: 3 mL via RESPIRATORY_TRACT
  Filled 2016-08-07: qty 3

## 2016-08-07 MED ORDER — PREDNISONE 20 MG PO TABS
60.0000 mg | ORAL_TABLET | Freq: Once | ORAL | Status: AC
  Administered 2016-08-07: 60 mg via ORAL
  Filled 2016-08-07: qty 3

## 2016-08-07 MED ORDER — ALBUTEROL SULFATE HFA 108 (90 BASE) MCG/ACT IN AERS: 2.0000 | INHALATION_SPRAY | Freq: Four times a day (QID) | RESPIRATORY_TRACT | 2 refills | Status: DC | PRN

## 2016-08-07 NOTE — ED Notes (Signed)
Pt ambulated on room air approximately 50 feet; pt O2 sat maintained 90-92% during ambulation.  Pt denies any shortness of breath or dizziness during ambulation.  No distress noted.  MD notified.

## 2016-08-07 NOTE — ED Notes (Signed)
Pt placed on bedpan at this time.

## 2016-08-07 NOTE — Telephone Encounter (Signed)
Message left for patient to return my call.  

## 2016-08-07 NOTE — ED Notes (Signed)
Resumed care from [place] rn.  Pt alert.  Family with pt.

## 2016-08-07 NOTE — ED Notes (Signed)
Pt eating dinner tray   meds given.    

## 2016-08-07 NOTE — ED Provider Notes (Signed)
[facility] Emergency Department Provider Note  ____________________________________________  Time seen: Approximately 12:25 PM  I have reviewed the triage vital signs and the nursing notes.   HISTORY  Chief Complaint Melena   HPI PEARL CLAMP is a 82 y.o. female with a history of A. fib on Coumadin, diverticulosis, hypertension, hyperlipidemia who presents for evaluation of melena. Patient reports that she has had diarrhea and cramping abdominal pain for the 6 days. Four days ago she saw her PCP who started her on cipro and flagyl for possible diverticulitis. Patient reports that for the last few days she has noticed black tarry stools which have cleared this morning and her stool is now green. She denies prior h/o GIB. She also complains of congestion and endorses mild DOE and dizziness this morning. No CP, wheezing or coughing, no vomiting, no hematemesis or hematochezia. No fever or chills. Patient denies abdominal pain at this time and reports that the pain was cramping in nature, located in her lower abdomen, intermittent, and resolved after BMs. She does endorse decreased PO intake over the course of the last few days. Last colonoscopy showing polyps, diverticulosis, and internal hemorrhoids in 10/2012.  Past Medical History:  Diagnosis Date  . Asthma   . Atrial fibrillation ([place])   . Benign neoplasm of colon 10/22/2005   Hyperplastic  & Adenomatous polyps   . Cardiomegaly 2013   by xray  . Diverticulosis of colon (without mention of hemorrhage)   . External hemorrhoids without mention of complication   . HLD (hyperlipidemia)   . HTN (hypertension)   . Malignant neoplasm of corpus uteri, except isthmus ([place])    followed by [name] with yearly visits  . Onychia and paronychia of toe   . Osteopenia 2017   by xray, but DEXA WNL 2014  . Pneumonia   . Polycythemia 11/2011   normal EPO and periph smear    Patient Active Problem List   Diagnosis Date  Noted  . Osteopenia 03/02/2016  . Midline low back pain without sciatica 06/20/2015  . Advanced care planning/counseling discussion 01/15/2015  . Dizziness 02/07/2014  . Medicare annual wellness visit, subsequent 12/05/2011  . Polycythemia 12/05/2011  . History of colon polyps 12/05/2011  . UNSPECIFIED MENOPAUSAL&POSTMENOPAUSAL DISORDER 09/19/2008  . CANCER, ENDOMETRIUM 02/22/2007  . HYPERCHOLESTEROLEMIA 02/22/2007  . Essential hypertension 02/22/2007  . REACTIVE AIRWAY DISEASE 02/22/2007  . DIVERTICULOSIS, COLON 02/22/2007  . DERMATITIS, OTHER ATOPIC 02/22/2007  . Atrial fibrillation ([place]) 02/03/2007    Past Surgical History:  Procedure Laterality Date  . CATARACT EXTRACTION Bilateral   . COLONOSCOPY  10/22/05   polyps-bx negative; divertics, ext hemmorhoids  . COLONOSCOPY  03/2013   tubular adenomas, small angiodysplastic lesion at cecum, mod diverticulosis Carlean Purl)  . DEXA  2014   WNL  . GYNECOLOGIC CRYOSURGERY    . LUMBAR DISC SURGERY  1970s  . REFRACTIVE SURGERY Bilateral   . TONSILLECTOMY    . TOTAL ABDOMINAL HYSTERECTOMY W/ BILATERAL SALPINGOOPHORECTOMY  2005   uterine adenocarcinoma    Prior to Admission medications   Medication Sig Start Date End Date Taking? Authorizing Provider  atenolol (TENORMIN) 50 MG tablet TAKE 1 TABLET (50 MG TOTAL) BY MOUTH DAILY. 04/06/16  Yes Ria Bush, MD  BIOTIN PO Take 1 tablet by mouth daily. Reported on 01/17/2016   Yes Historical Provider, MD  Calcium-Magnesium-Vitamin D (CALCIUM 1200+D3 PO) Take 1 tablet by mouth daily.   Yes Historical Provider, MD  cholecalciferol (VITAMIN D) 1000 UNITS tablet Take  1,000 Units by mouth daily.   Yes Historical Provider, MD  ciprofloxacin (CIPRO) 250 MG tablet Take 1 tablet (250 mg total) by mouth 2 (two) times daily. 08/04/16  Yes Pleas Koch, NP  Coenzyme Q10 200 MG capsule Take 200 mg by mouth daily.   Yes Historical Provider, MD  metroNIDAZOLE (FLAGYL) 500 MG tablet Take 1 tablet (500  mg total) by mouth 3 (three) times daily. 08/04/16  Yes Pleas Koch, NP  potassium chloride SA (K-DUR,KLOR-CON) 10 MEQ tablet Take 1 tablet (10 mEq total) by mouth daily. 01/17/16  Yes Ria Bush, MD  pravastatin (PRAVACHOL) 40 MG tablet Take 1 tablet (40 mg total) by mouth daily. 01/17/16  Yes Ria Bush, MD  triamterene-hydrochlorothiazide (MAXZIDE-25) 37.5-25 MG tablet Take 1 tablet by mouth daily. 01/17/16  Yes Ria Bush, MD  warfarin (COUMADIN) 5 MG tablet TAKE AS DIRECTED BY ANTI- COAGULATION CLINIC. 02/06/16  Yes Ria Bush, MD  albuterol (PROVENTIL HFA;VENTOLIN HFA) 108 (90 Base) MCG/ACT inhaler Inhale 2 puffs into the lungs every 6 (six) hours as needed for wheezing or shortness of breath. 08/07/16   Rudene Re, MD  Polyethyl Glycol-Propyl Glycol (SYSTANE) 0.4-0.3 % SOLN Apply 1 drop to eye 4 (four) times daily as needed.     Historical Provider, MD  predniSONE (DELTASONE) 20 MG tablet Take 1 tablet (20 mg total) by mouth daily. 08/07/16 08/07/17  Rudene Re, MD    Allergies Amoxicillin and Penicillins  Family History  Problem Relation Age of Onset  . Hypertension Father   . Stroke Father   . Diabetes Mother   . Coronary artery disease Mother   . Hypertension Mother   . Colon cancer Neg Hx     Social History Social History  Substance Use Topics  . Smoking status: Former Smoker    Types: Cigarettes    Quit date: 09/21/1982  . Smokeless tobacco: Never Used  . Alcohol use No    Review of Systems  Constitutional: Negative for fever. Eyes: Negative for visual changes. ENT: Negative for sore throat. Neck: No neck pain  Cardiovascular: Negative for chest pain. Respiratory: Negative for shortness of breath. Gastrointestinal: + abdominal pain, diarrhea, and melena. No vomiting  Genitourinary: Negative for dysuria. Musculoskeletal: Negative for back pain. Skin: Negative for rash. Neurological: Negative for headaches, weakness or  numbness. Psych: No SI or HI  ____________________________________________   PHYSICAL EXAM:  VITAL SIGNS: ED Triage Vitals  Enc Vitals Group     BP 08/07/16 1157 (!) 93/51     Pulse Rate 08/07/16 1157 89     Resp 08/07/16 1157 18     Temp 08/07/16 1157 97.4 F (36.3 C)     Temp Source 08/07/16 1157 Oral     SpO2 08/07/16 1157 91 %     Weight 08/07/16 1200 140 lb (63.5 kg)     Height 08/07/16 1200 5\' 10"  (1.778 m)     Head Circumference --      Peak Flow --      Pain Score --      Pain Loc --      Pain Edu? --      Excl. in [place]? --     Constitutional: Alert and oriented. Well appearing and in no apparent distress. HEENT:      Head: Normocephalic and atraumatic.         Eyes: Conjunctivae are normal. Sclera is non-icteric. EOMI. PERRL      Mouth/Throat: Mucous membranes are moist.  Neck: Supple with no signs of meningismus. Cardiovascular: Regular rate and rhythm. No murmurs, gallops, or rubs. 2+ symmetrical distal pulses are present in all extremities. No JVD. Respiratory: Normal respiratory effort. Lungs are clear to auscultation bilaterally. No wheezes, crackles, or rhonchi.  Gastrointestinal: Soft, non tender, and non distended with positive bowel sounds. No rebound or guarding. Genitourinary: No CVA tenderness. Rectal exam showing green stool hemoccult negative Musculoskeletal: Nontender with normal range of motion in all extremities. No edema, cyanosis, or erythema of extremities. Neurologic: Normal speech and language. Face is symmetric. Moving all extremities. No gross focal neurologic deficits are appreciated. Skin: Skin is warm, dry and intact. No rash noted. Psychiatric: Mood and affect are normal. Speech and behavior are normal.  ____________________________________________   LABS (all labs ordered are listed, but only abnormal results are displayed)  Labs Reviewed  CBC WITH DIFFERENTIAL/PLATELET - Abnormal; Notable for the following:       Result Value    Lymphs Abs 0.9 (*)    Monocytes Absolute 1.3 (*)    All other components within normal limits  PROTIME-INR - Abnormal; Notable for the following:    Prothrombin Time 26.4 (*)    All other components within normal limits  COMPREHENSIVE METABOLIC PANEL - Abnormal; Notable for the following:    Chloride 96 (*)    BUN 27 (*)    Creatinine, Ser 1.16 (*)    GFR calc non Af Amer 41 (*)    GFR calc Af Amer 48 (*)    All other components within normal limits  TROPONIN I  TROPONIN I  URINALYSIS COMPLETEWITH MICROSCOPIC ([facility] ONLY)  TYPE AND SCREEN   ____________________________________________  EKG  ED ECG REPORT I, Rudene Re, the attending physician, personally viewed and interpreted this ECG.  Atrial fibrillation, rate of 79, normal QRS and QTc intervals, right axis deviation, T-wave inversions in inferior leads with ST depressions on V3-V5. No prior for comparison  16:07 - atrial fibrillation, rate of 80, normal QRS and QTc intervals, right axis deviation, T-wave inversions inferior and lateral leads which are unchanged from prior. ____________________________________________  RADIOLOGY  CXR: Stable mild cardiac enlargement and aortic atherosclerosis. Stable emphysematous changes and pulmonary scarring. No acute overlying pulmonary process. ____________________________________________   PROCEDURES  Procedure(s) performed: None Procedures Critical Care performed:  None ____________________________________________   INITIAL IMPRESSION / ASSESSMENT AND PLAN / ED COURSE  82 y.o. female with a history of A. fib on Coumadin, diverticulosis, hypertension, hyperlipidemia who presents for evaluation of diarrhea x 6 days, abdominal pain now resolved and 2 days of melena now resolved. Patient's vital signs show BP of the 93/51. Review of Epic shows the patient's normal blood pressure is usually in the low 110s. Abdomen is soft with no tenderness to palpation, patient is  extremely well appearing. Patient currently on cipro and flagyl with improvement of symptoms consistent with diverticulitis. Will hold off on imaging at this time. Rectal exam hemoccult negative with green stool. Plan for CBC, CMP, type and screen, INR, UA. Will give IVF. Patient noted to be hypoxic on RA to low 90s which improved on 2L [place]. Lungs are CTAB. Will get CXR.  Clinical Course    Patient weaned off of oxygen after 1 duoneb. Ambulated now and dropped sats to 88% on RA. I Reevaluated patient was not wheezing. I have ordered 2 more DuoNebs. Will reassess after that. Her repeat EKG is unchanged from prior. Second troponin is pending.   _________________________ 6:17 PM on 08/07/2016 -----------------------------------------  2nd troponin negative. Patient ambulated with sats 92%. Sats 99% at rest. VS WNL. Will dc home on albuterol, prednisone, and f/u with PCP.   Pertinent labs & imaging results that were available during my care of the patient were reviewed by me and considered in my medical decision making (see chart for details).    ____________________________________________   FINAL CLINICAL IMPRESSION(S) / ED DIAGNOSES  Final diagnoses:  COPD exacerbation ([place])  Diarrhea, unspecified type      NEW MEDICATIONS STARTED DURING THIS VISIT:  New Prescriptions   ALBUTEROL (PROVENTIL HFA;VENTOLIN HFA) 108 (90 BASE) MCG/ACT INHALER    Inhale 2 puffs into the lungs every 6 (six) hours as needed for wheezing or shortness of breath.   PREDNISONE (DELTASONE) 20 MG TABLET    Take 1 tablet (20 mg total) by mouth daily.     Note:  This document was prepared using Dragon voice recognition software and may include unintentional dictation errors.    Rudene Re, MD 08/07/16 [id number]

## 2016-08-07 NOTE — ED Notes (Signed)
20g iv d/c'ed from lac.

## 2016-08-07 NOTE — ED Triage Notes (Signed)
Pt reports black and tarry stools for two days. Pt reports having some nausea, vomiting and diarrhea at the beginning of the week. Pt states she was seen Monday and given Cipro and Flagyl. Pt in no apparent distress in triage.

## 2016-08-07 NOTE — Telephone Encounter (Signed)
Tried calling patient. Line busy. Will try again later.

## 2016-08-07 NOTE — Discharge Instructions (Signed)

## 2016-08-07 NOTE — Telephone Encounter (Signed)
Pt is requesting call from you.  She is refusing to go through triage.  She is wanting to talk to you about symptoms not getting any better ( diarrhea)  Please call [phone] Thanks

## 2016-08-07 NOTE — ED Notes (Signed)
md in with pt and family during discharge.

## 2016-08-07 NOTE — ED Notes (Signed)
Pt ambulatory , o2sats to 88-89%, pt denied any SOB or dizziness. Upon arrival back to room pt left off o2 [place] and sats 93-94% on RA. MD notified

## 2016-08-10 ENCOUNTER — Ambulatory Visit: Payer: PPO | Admitting: Podiatry

## 2016-08-10 NOTE — Telephone Encounter (Signed)
Patient went to ER>

## 2016-08-11 ENCOUNTER — Ambulatory Visit (INDEPENDENT_AMBULATORY_CARE_PROVIDER_SITE_OTHER): Payer: PPO | Admitting: Primary Care

## 2016-08-11 ENCOUNTER — Encounter: Payer: Self-pay | Admitting: Primary Care

## 2016-08-11 VITALS — BP 106/62 | HR 82 | Temp 98.1°F | Wt 147.4 lb

## 2016-08-11 DIAGNOSIS — Z09 Encounter for follow-up examination after completed treatment for conditions other than malignant neoplasm: Secondary | ICD-10-CM

## 2016-08-11 NOTE — Progress Notes (Signed)
Subjective:    Patient ID: Erica Bridges, female    DOB: 1927/03/03, 82 y.o.   MRN: [id number]  HPI  Erica Bridges is an 82 year old female with a history of diverticulosis, atrial fibrillation managaed on coumadin, hypertension who presents today for emergency department follow up.  She presented to [facility] ED on 08/07/16 with a chief complaint of melena. She also reports diarrhea and abdominal cramping, chest congestion, dyspnea upon exertion. She was evaluated and treated on 08/03/16 for presumed diverticulitis with Cipro and Flagyl.   During her stay in the ED she underwent ECG (atrial fibrillation, rate of 79, normal QRS and QTc intervals, right axis, T-Wave inversions in inferior leads, ST depression on V3-V5), chest xray (stable cardiac enlargement and emphysematous changes), hemoccult (negative), CBC, CMP, Troponin (unremarkable). She was noted to become hypoxic upon ambulation with room air sats dropping to 88%, improved with several duoneb treatments. She was discharged home on 08/07/16 with a prescription for albuterol and prednisone.  Since her discharge home she's feeling well. She does continue to experience congestion. Her abdominal pain has improved since initial treatment with Cipro and Flagyl. She denies fevers, chills, increased fatigue, nausea. Her daughter is concerned about her room air saturations upon ambulation and the bluish colors to her hands. She denies shortness of breath upon ambulation with walker.  Review of Systems  Constitutional: Negative for fatigue and fever.  HENT: Positive for congestion.   Respiratory: Negative for cough.   Cardiovascular: Negative for chest pain.  Gastrointestinal: Negative for abdominal pain, blood in stool, diarrhea and nausea.       Past Medical History:  Diagnosis Date  . Asthma   . Atrial fibrillation ([place])   . Benign neoplasm of colon 10/22/2005   Hyperplastic  & Adenomatous polyps   . Cardiomegaly 2013   by xray  .  Diverticulosis of colon (without mention of hemorrhage)   . External hemorrhoids without mention of complication   . HLD (hyperlipidemia)   . HTN (hypertension)   . Malignant neoplasm of corpus uteri, except isthmus ([place])    followed by [name] with yearly visits  . Onychia and paronychia of toe   . Osteopenia 2017   by xray, but DEXA WNL 2014  . Pneumonia   . Polycythemia 11/2011   normal EPO and periph smear     Social History   Social History  . Marital status: Widowed    Spouse name: N/A  . Number of children: 3  . Years of education: N/A   Occupational History  . Retired Radiation protection practitioner    Social History Main Topics  . Smoking status: Former Smoker    Types: Cigarettes    Quit date: 09/21/1982  . Smokeless tobacco: Never Used  . Alcohol use No  . Drug use: No  . Sexual activity: Not on file   Other Topics Concern  . Not on file   Social History Narrative   Retired, widow   One son Janeece Riggers - part time post office), one daughter ([place] Engineer, mining), other daughter Janeece Riggers)   Daily caffeine 1+    Past Surgical History:  Procedure Laterality Date  . CATARACT EXTRACTION Bilateral   . COLONOSCOPY  10/22/05   polyps-bx negative; divertics, ext hemmorhoids  . COLONOSCOPY  03/2013   tubular adenomas, small angiodysplastic lesion at cecum, mod diverticulosis Carlean Purl)  . DEXA  2014   WNL  . GYNECOLOGIC CRYOSURGERY    . LUMBAR DISC SURGERY  1970s  . REFRACTIVE  SURGERY Bilateral   . TONSILLECTOMY    . TOTAL ABDOMINAL HYSTERECTOMY W/ BILATERAL SALPINGOOPHORECTOMY  2005   uterine adenocarcinoma    Family History  Problem Relation Age of Onset  . Hypertension Father   . Stroke Father   . Diabetes Mother   . Coronary artery disease Mother   . Hypertension Mother   . Colon cancer Neg Hx     Allergies  Allergen Reactions  . Amoxicillin     REACTION: rashj  . Penicillins     Has patient had a PCN reaction causing immediate rash,  facial/tongue/throat swelling, SOB or lightheadedness with hypotension: no Has patient had a PCN reaction causing severe rash involving mucus membranes or skin necrosis: no Has patient had a PCN reaction that required hospitalization no Has patient had a PCN reaction occurring within the last 10 years: no If all of the above answers are "NO", then may proceed with Cephalosporin use.     Current Outpatient Prescriptions on File Prior to Visit  Medication Sig Dispense Refill  . albuterol (PROVENTIL HFA;VENTOLIN HFA) 108 (90 Base) MCG/ACT inhaler Inhale 2 puffs into the lungs every 6 (six) hours as needed for wheezing or shortness of breath. 1 Inhaler 2  . atenolol (TENORMIN) 50 MG tablet TAKE 1 TABLET (50 MG TOTAL) BY MOUTH DAILY. 90 tablet 0  . BIOTIN PO Take 1 tablet by mouth daily. Reported on 01/17/2016    . Calcium-Magnesium-Vitamin D (CALCIUM 1200+D3 PO) Take 1 tablet by mouth daily.    . cholecalciferol (VITAMIN D) 1000 UNITS tablet Take 1,000 Units by mouth daily.    . ciprofloxacin (CIPRO) 250 MG tablet Take 1 tablet (250 mg total) by mouth 2 (two) times daily. 14 tablet 0  . Coenzyme Q10 200 MG capsule Take 200 mg by mouth daily.    . metroNIDAZOLE (FLAGYL) 500 MG tablet Take 1 tablet (500 mg total) by mouth 3 (three) times daily. 21 tablet 0  . Polyethyl Glycol-Propyl Glycol (SYSTANE) 0.4-0.3 % SOLN Apply 1 drop to eye 4 (four) times daily as needed.     . potassium chloride SA (K-DUR,KLOR-CON) 10 MEQ tablet Take 1 tablet (10 mEq total) by mouth daily. 90 tablet 3  . pravastatin (PRAVACHOL) 40 MG tablet Take 1 tablet (40 mg total) by mouth daily. 90 tablet 3  . predniSONE (DELTASONE) 20 MG tablet Take 1 tablet (20 mg total) by mouth daily. 30 tablet 0  . triamterene-hydrochlorothiazide (MAXZIDE-25) 37.5-25 MG tablet Take 1 tablet by mouth daily. 90 tablet 3  . warfarin (COUMADIN) 5 MG tablet TAKE AS DIRECTED BY ANTI- COAGULATION CLINIC. 75 tablet 1   No current facility-administered  medications on file prior to visit.     BP 106/62   Pulse 82   Temp 98.1 F (36.7 C) (Oral)   Wt 147 lb 6.4 oz (66.9 kg)   SpO2 92%   BMI 21.15 kg/m    Objective:   Physical Exam  Constitutional: She appears well-nourished.  Neck: Neck supple.  Cardiovascular: Normal rate and regular rhythm.   Pulmonary/Chest: Effort normal and breath sounds normal. She has no wheezes. She has no rales.  Abdominal: Soft. Bowel sounds are normal.  Mild tenderness to LLQ, overall improved  Skin:  Mild bluish discoloration to fingers bilaterally          Assessment & Plan:  Emergency Department Follow Up:  [facility]ed to [facility]/17 for abdominal pain, diarrhea, dyspnea. Work up overall stable, no active abdominal or respiratory infection. CBC  with improvement when compared to office visit on 11/13. ED did notice room air sats drop to 88% upon ambulation. Suspect this is likely baseline for her as she dropped to 88% on room air in the clinic when ambulating.  ED provided her with SABA and oral prednisone, discussed indications for SABA use. May require controller inhaler if she starts using albuterol more than 2-3 times weekly. Overall she's stable, appears well, asymptomatic during ambulation. Exam without evidence of consolidation or diminished sounds. Discussed to follow up with PCP if SABA use exceeds 2-3 times weekly and/or if dyspnea returns. Discussed return precautions.  All ED notes, imaging, and labs reviewed.   Sheral Flow, NP

## 2016-08-11 NOTE — Progress Notes (Signed)
Pre visit review using our clinic review tool, if applicable. No additional management support is needed unless otherwise documented below in the visit note. 

## 2016-08-11 NOTE — Patient Instructions (Addendum)
Start taking your pravastatin with your evening medications.  Continue Prednisone daily until finished.   You may use the albuterol inhaler every 6 hours as needed for wheezing or shortness of breath. Please notify Dr. Danise Mina if you require use of the albuterol inhaler more than 2-3 times weekly as you may need a daily inhaler.  It was a pleasure to see you today!

## 2016-08-27 ENCOUNTER — Other Ambulatory Visit (INDEPENDENT_AMBULATORY_CARE_PROVIDER_SITE_OTHER): Payer: PPO

## 2016-08-27 DIAGNOSIS — Z7901 Long term (current) use of anticoagulants: Secondary | ICD-10-CM

## 2016-08-27 DIAGNOSIS — I4891 Unspecified atrial fibrillation: Secondary | ICD-10-CM | POA: Diagnosis not present

## 2016-08-27 DIAGNOSIS — Z5181 Encounter for therapeutic drug level monitoring: Secondary | ICD-10-CM | POA: Diagnosis not present

## 2016-08-27 LAB — POCT INR: INR: 3.4

## 2016-09-04 ENCOUNTER — Ambulatory Visit (INDEPENDENT_AMBULATORY_CARE_PROVIDER_SITE_OTHER): Payer: PPO

## 2016-09-04 ENCOUNTER — Telehealth: Payer: Self-pay

## 2016-09-04 DIAGNOSIS — Z7901 Long term (current) use of anticoagulants: Secondary | ICD-10-CM | POA: Diagnosis not present

## 2016-09-04 DIAGNOSIS — Z79899 Other long term (current) drug therapy: Secondary | ICD-10-CM | POA: Diagnosis not present

## 2016-09-04 DIAGNOSIS — Z1509 Genetic susceptibility to other malignant neoplasm: Secondary | ICD-10-CM | POA: Diagnosis not present

## 2016-09-04 DIAGNOSIS — Z8049 Family history of malignant neoplasm of other genital organs: Secondary | ICD-10-CM | POA: Diagnosis not present

## 2016-09-04 DIAGNOSIS — F413 Other mixed anxiety disorders: Secondary | ICD-10-CM | POA: Diagnosis not present

## 2016-09-04 DIAGNOSIS — G8929 Other chronic pain: Secondary | ICD-10-CM | POA: Diagnosis not present

## 2016-09-04 DIAGNOSIS — I4891 Unspecified atrial fibrillation: Secondary | ICD-10-CM

## 2016-09-04 DIAGNOSIS — L249 Irritant contact dermatitis, unspecified cause: Secondary | ICD-10-CM | POA: Diagnosis not present

## 2016-09-04 DIAGNOSIS — Z1371 Encounter for nonprocreative screening for genetic disease carrier status: Secondary | ICD-10-CM | POA: Diagnosis not present

## 2016-09-04 DIAGNOSIS — I200 Unstable angina: Secondary | ICD-10-CM | POA: Diagnosis not present

## 2016-09-04 DIAGNOSIS — Z5181 Encounter for therapeutic drug level monitoring: Secondary | ICD-10-CM

## 2016-09-04 LAB — POCT INR: INR: 2.7

## 2016-09-04 MED ORDER — PREDNISONE 5 MG PO TABS: ORAL_TABLET | ORAL | 0 refills | Status: DC

## 2016-09-04 MED ORDER — WARFARIN SODIUM 5 MG PO TABS: ORAL_TABLET | ORAL | 2 refills | Status: DC

## 2016-09-04 MED ORDER — ATENOLOL 50 MG PO TABS: ORAL_TABLET | ORAL | 2 refills | Status: DC

## 2016-09-04 NOTE — Telephone Encounter (Signed)
Spoke with patient and informed her of new orders to titrate off of prednisone.  She also is requesting for refills on her Warfarin and Atenolol so she can pick them all up at once.  R/X for Warfarin is pending MD approval.  NOTE:  Atenolol - last filled #90 50mg  on 04/06/16.  Apparently the pharmacy did not have 50mg  tablets at the time and gave the patient #90 100mg  tablets to cut in 1/2 (which she has done).  This is why she has not needed a refill prior to today.  Will pend your approval for #90 only of the 50mg  tablets.  Thanks.

## 2016-09-04 NOTE — Telephone Encounter (Signed)
Patient is questioning whether she should continue on the prednisone prescribed by the ER approx 1 month ago.    She reports her condition has much improved, and denies any dyspnea with O2 sats during coag visit today at 93% on [place].  She has not used/needed her rescue inhaler in several days.    Gi/diverticuli symptoms have resolved, she denies any pain and even notices decreased swelling in her hands.  I informed patient that the prednisone appears to have been prescribed for short term tx of acute symptoms and, due to improved condition, she may not need to continue;however, I will run this by Dr. Danise Mina.    Please advise.

## 2016-09-04 NOTE — Telephone Encounter (Signed)
Agree. Should not have been on such long a course of prednisone but now that she has taken daily for the past month, recommend slow taper to safely come off this.  rec: Prednisone 10mg  daily x 10 days then        Prednisone 5mg  daily x 10 days then        Prednisone 5mg  every other day x 10 days then         Stop Rx sent to pharmacy.  Let us know if not feeling well coming off prednisone.

## 2016-09-04 NOTE — Telephone Encounter (Signed)
Sent in

## 2016-09-04 NOTE — Patient Instructions (Signed)
Pre visit review using our clinic review tool, if applicable. No additional management support is needed unless otherwise documented below in the visit note. 

## 2016-09-29 ENCOUNTER — Ambulatory Visit (INDEPENDENT_AMBULATORY_CARE_PROVIDER_SITE_OTHER): Payer: Medicare Other

## 2016-09-29 DIAGNOSIS — I4891 Unspecified atrial fibrillation: Secondary | ICD-10-CM

## 2016-09-29 LAB — POCT INR: INR: 2.1

## 2016-09-29 NOTE — Patient Instructions (Signed)
Pre visit review using our clinic review tool, if applicable. No additional management support is needed unless otherwise documented below in the visit note. 

## 2016-10-21 ENCOUNTER — Observation Stay (HOSPITAL_COMMUNITY): Payer: Medicare Other

## 2016-10-21 ENCOUNTER — Encounter (HOSPITAL_COMMUNITY): Payer: Self-pay

## 2016-10-21 ENCOUNTER — Inpatient Hospital Stay (HOSPITAL_COMMUNITY)
Admission: EM | Admit: 2016-10-21 | Discharge: 2016-10-29 | DRG: 069 | Disposition: A | Discharge status: Skilled Nursing Facility | Payer: Medicare Other | Attending: Internal Medicine | Admitting: Internal Medicine

## 2016-10-21 ENCOUNTER — Observation Stay (HOSPITAL_BASED_OUTPATIENT_CLINIC_OR_DEPARTMENT_OTHER): Payer: Medicare Other

## 2016-10-21 ENCOUNTER — Emergency Department (HOSPITAL_COMMUNITY): Payer: Medicare Other

## 2016-10-21 DIAGNOSIS — R0603 Acute respiratory distress: Secondary | ICD-10-CM | POA: Diagnosis not present

## 2016-10-21 DIAGNOSIS — I7411 Embolism and thrombosis of thoracic aorta: Secondary | ICD-10-CM | POA: Diagnosis not present

## 2016-10-21 DIAGNOSIS — I959 Hypotension, unspecified: Secondary | ICD-10-CM | POA: Diagnosis present

## 2016-10-21 DIAGNOSIS — I482 Chronic atrial fibrillation: Secondary | ICD-10-CM | POA: Diagnosis not present

## 2016-10-21 DIAGNOSIS — I671 Cerebral aneurysm, nonruptured: Secondary | ICD-10-CM | POA: Diagnosis present

## 2016-10-21 DIAGNOSIS — N183 Chronic kidney disease, stage 3 unspecified: Secondary | ICD-10-CM

## 2016-10-21 DIAGNOSIS — I749 Embolism and thrombosis of unspecified artery: Secondary | ICD-10-CM

## 2016-10-21 DIAGNOSIS — Z7982 Long term (current) use of aspirin: Secondary | ICD-10-CM

## 2016-10-21 DIAGNOSIS — I5032 Chronic diastolic (congestive) heart failure: Secondary | ICD-10-CM

## 2016-10-21 DIAGNOSIS — R197 Diarrhea, unspecified: Secondary | ICD-10-CM | POA: Diagnosis not present

## 2016-10-21 DIAGNOSIS — I509 Heart failure, unspecified: Secondary | ICD-10-CM

## 2016-10-21 DIAGNOSIS — I5033 Acute on chronic diastolic (congestive) heart failure: Secondary | ICD-10-CM | POA: Diagnosis present

## 2016-10-21 DIAGNOSIS — R0602 Shortness of breath: Secondary | ICD-10-CM | POA: Diagnosis not present

## 2016-10-21 DIAGNOSIS — I272 Pulmonary hypertension, unspecified: Secondary | ICD-10-CM | POA: Diagnosis not present

## 2016-10-21 DIAGNOSIS — Z87891 Personal history of nicotine dependence: Secondary | ICD-10-CM

## 2016-10-21 DIAGNOSIS — N390 Urinary tract infection, site not specified: Secondary | ICD-10-CM | POA: Diagnosis not present

## 2016-10-21 DIAGNOSIS — E7800 Pure hypercholesterolemia, unspecified: Secondary | ICD-10-CM | POA: Diagnosis not present

## 2016-10-21 DIAGNOSIS — I513 Intracardiac thrombosis, not elsewhere classified: Secondary | ICD-10-CM | POA: Diagnosis present

## 2016-10-21 DIAGNOSIS — R404 Transient alteration of awareness: Secondary | ICD-10-CM | POA: Diagnosis not present

## 2016-10-21 DIAGNOSIS — I4891 Unspecified atrial fibrillation: Secondary | ICD-10-CM | POA: Diagnosis present

## 2016-10-21 DIAGNOSIS — I10 Essential (primary) hypertension: Secondary | ICD-10-CM | POA: Diagnosis present

## 2016-10-21 DIAGNOSIS — Z0000 Encounter for general adult medical examination without abnormal findings: Secondary | ICD-10-CM

## 2016-10-21 DIAGNOSIS — I071 Rheumatic tricuspid insufficiency: Secondary | ICD-10-CM | POA: Diagnosis present

## 2016-10-21 DIAGNOSIS — Z7901 Long term (current) use of anticoagulants: Secondary | ICD-10-CM

## 2016-10-21 DIAGNOSIS — I639 Cerebral infarction, unspecified: Secondary | ICD-10-CM

## 2016-10-21 DIAGNOSIS — Z79899 Other long term (current) drug therapy: Secondary | ICD-10-CM

## 2016-10-21 DIAGNOSIS — G459 Transient cerebral ischemic attack, unspecified: Secondary | ICD-10-CM | POA: Diagnosis not present

## 2016-10-21 DIAGNOSIS — J449 Chronic obstructive pulmonary disease, unspecified: Secondary | ICD-10-CM | POA: Diagnosis present

## 2016-10-21 DIAGNOSIS — R29818 Other symptoms and signs involving the nervous system: Secondary | ICD-10-CM | POA: Diagnosis not present

## 2016-10-21 DIAGNOSIS — E785 Hyperlipidemia, unspecified: Secondary | ICD-10-CM | POA: Diagnosis present

## 2016-10-21 DIAGNOSIS — Z8542 Personal history of malignant neoplasm of other parts of uterus: Secondary | ICD-10-CM

## 2016-10-21 DIAGNOSIS — I7410 Embolism and thrombosis of unspecified parts of aorta: Secondary | ICD-10-CM

## 2016-10-21 DIAGNOSIS — I130 Hypertensive heart and chronic kidney disease with heart failure and stage 1 through stage 4 chronic kidney disease, or unspecified chronic kidney disease: Secondary | ICD-10-CM | POA: Diagnosis present

## 2016-10-21 DIAGNOSIS — R531 Weakness: Secondary | ICD-10-CM | POA: Diagnosis not present

## 2016-10-21 DIAGNOSIS — Z823 Family history of stroke: Secondary | ICD-10-CM

## 2016-10-21 DIAGNOSIS — B9620 Unspecified Escherichia coli [E. coli] as the cause of diseases classified elsewhere: Secondary | ICD-10-CM | POA: Diagnosis not present

## 2016-10-21 LAB — DIFFERENTIAL
BASOS ABS: 0 10*3/uL (ref 0.0–0.1)
Basophils Relative: 0 %
EOS PCT: 2 %
Eosinophils Absolute: 0.1 10*3/uL (ref 0.0–0.7)
LYMPHS PCT: 17 %
Lymphs Abs: 1.1 10*3/uL (ref 0.7–4.0)
Monocytes Absolute: 0.9 10*3/uL (ref 0.1–1.0)
Monocytes Relative: 14 %
NEUTROS ABS: 4.6 10*3/uL (ref 1.7–7.7)
NEUTROS PCT: 67 %

## 2016-10-21 LAB — I-STAT CHEM 8, ED
BUN: 34 mg/dL — AB (ref 6–20)
CHLORIDE: 105 mmol/L (ref 101–111)
Calcium, Ion: 1.19 mmol/L (ref 1.15–1.40)
Creatinine, Ser: 1.2 mg/dL — ABNORMAL HIGH (ref 0.44–1.00)
Glucose, Bld: 90 mg/dL (ref 65–99)
HEMATOCRIT: 39 % (ref 36.0–46.0)
Hemoglobin: 13.3 g/dL (ref 12.0–15.0)
Potassium: 4.8 mmol/L (ref 3.5–5.1)
SODIUM: 141 mmol/L (ref 135–145)
TCO2: 32 mmol/L (ref 0–100)

## 2016-10-21 LAB — URINALYSIS, ROUTINE W REFLEX MICROSCOPIC
Bilirubin Urine: NEGATIVE
Glucose, UA: NEGATIVE mg/dL
Ketones, ur: NEGATIVE mg/dL
NITRITE: POSITIVE — AB
Protein, ur: NEGATIVE mg/dL
SPECIFIC GRAVITY, URINE: 1.013 (ref 1.005–1.030)
pH: 5 (ref 5.0–8.0)

## 2016-10-21 LAB — APTT: aPTT: 33 seconds (ref 24–36)

## 2016-10-21 LAB — COMPREHENSIVE METABOLIC PANEL
ALBUMIN: 3.3 g/dL — AB (ref 3.5–5.0)
ALK PHOS: 76 U/L (ref 38–126)
ALT: 16 U/L (ref 14–54)
ANION GAP: 10 (ref 5–15)
AST: 25 U/L (ref 15–41)
BUN: 31 mg/dL — ABNORMAL HIGH (ref 6–20)
CALCIUM: 9.5 mg/dL (ref 8.9–10.3)
CO2: 27 mmol/L (ref 22–32)
Chloride: 103 mmol/L (ref 101–111)
Creatinine, Ser: 1.16 mg/dL — ABNORMAL HIGH (ref 0.44–1.00)
GFR calc Af Amer: 48 mL/min — ABNORMAL LOW (ref >60)
GFR calc non Af Amer: 41 mL/min — ABNORMAL LOW (ref >60)
GLUCOSE: 89 mg/dL (ref 65–99)
Potassium: 4.8 mmol/L (ref 3.5–5.1)
SODIUM: 140 mmol/L (ref 135–145)
Total Bilirubin: 0.7 mg/dL (ref 0.3–1.2)
Total Protein: 6.4 g/dL — ABNORMAL LOW (ref 6.5–8.1)

## 2016-10-21 LAB — CBC
HCT: 40.3 % (ref 36.0–46.0)
Hemoglobin: 12.6 g/dL (ref 12.0–15.0)
MCH: 29.2 pg (ref 26.0–34.0)
MCHC: 31.3 g/dL (ref 30.0–36.0)
MCV: 93.3 fL (ref 78.0–100.0)
PLATELETS: 187 10*3/uL (ref 150–400)
RBC: 4.32 MIL/uL (ref 3.87–5.11)
RDW: 16.2 % — ABNORMAL HIGH (ref 11.5–15.5)
WBC: 6.8 10*3/uL (ref 4.0–10.5)

## 2016-10-21 LAB — ETHANOL: Alcohol, Ethyl (B): 12 mg/dL — ABNORMAL HIGH (ref <5)

## 2016-10-21 LAB — VAS US CAROTID
LCCADDIAS: 27 cm/s
LCCADSYS: 81 cm/s
LEFT ECA DIAS: -11 cm/s
LEFT VERTEBRAL DIAS: 21 cm/s
LICADSYS: -69 cm/s
Left CCA prox dias: 9 cm/s
Left CCA prox sys: 71 cm/s
Left ICA dist dias: -19 cm/s
Left ICA prox dias: 42 cm/s
Left ICA prox sys: 131 cm/s
RCCADSYS: -91 cm/s
RCCAPSYS: 72 cm/s
RIGHT ECA DIAS: -2 cm/s
RIGHT VERTEBRAL DIAS: 26 cm/s
Right CCA prox dias: 17 cm/s

## 2016-10-21 LAB — PROTIME-INR
INR: 2.15
PROTHROMBIN TIME: 24.3 s — AB (ref 11.4–15.2)

## 2016-10-21 LAB — RAPID URINE DRUG SCREEN, HOSP PERFORMED
AMPHETAMINES: NOT DETECTED
Barbiturates: NOT DETECTED
Benzodiazepines: NOT DETECTED
Cocaine: NOT DETECTED
OPIATES: NOT DETECTED
TETRAHYDROCANNABINOL: NOT DETECTED

## 2016-10-21 LAB — I-STAT TROPONIN, ED: Troponin i, poc: 0.02 ng/mL (ref 0.00–0.08)

## 2016-10-21 LAB — CBG MONITORING, ED: Glucose-Capillary: 78 mg/dL (ref 65–99)

## 2016-10-21 LAB — LIPID PANEL
CHOL/HDL RATIO: 1.9 ratio
CHOLESTEROL: 149 mg/dL (ref 0–200)
HDL: 78 mg/dL (ref >40)
LDL Cholesterol: 59 mg/dL (ref 0–99)
Triglycerides: 62 mg/dL (ref <150)
VLDL: 12 mg/dL (ref 0–40)

## 2016-10-21 MED ORDER — METOPROLOL TARTRATE 5 MG/5ML IV SOLN: 5.0000 mg | INTRAVENOUS | Status: DC | PRN

## 2016-10-21 MED ORDER — POTASSIUM CHLORIDE CRYS ER 10 MEQ PO TBCR
10.0000 meq | EXTENDED_RELEASE_TABLET | Freq: Every day | ORAL | Status: DC
  Administered 2016-10-21 – 2016-10-29 (×9): 10 meq via ORAL
  Filled 2016-10-21 (×9): qty 1

## 2016-10-21 MED ORDER — ACETAMINOPHEN 650 MG RE SUPP: 650.0000 mg | RECTAL | Status: DC | PRN

## 2016-10-21 MED ORDER — SODIUM CHLORIDE 0.9 % IV SOLN
INTRAVENOUS | Status: DC
  Administered 2016-10-21: 06:00:00 via INTRAVENOUS

## 2016-10-21 MED ORDER — ALBUTEROL SULFATE (2.5 MG/3ML) 0.083% IN NEBU
3.0000 mL | INHALATION_SOLUTION | RESPIRATORY_TRACT | Status: DC | PRN
  Administered 2016-10-22 (×2): 3 mL via RESPIRATORY_TRACT
  Filled 2016-10-21 (×2): qty 3

## 2016-10-21 MED ORDER — IOPAMIDOL (ISOVUE-370) INJECTION 76%
INTRAVENOUS | Status: AC
  Filled 2016-10-21: qty 50

## 2016-10-21 MED ORDER — WARFARIN SODIUM 5 MG PO TABS
5.0000 mg | ORAL_TABLET | ORAL | Status: DC
  Administered 2016-10-22: 5 mg via ORAL
  Filled 2016-10-21: qty 1

## 2016-10-21 MED ORDER — ACETAMINOPHEN 160 MG/5ML PO SOLN: 650.0000 mg | ORAL | Status: DC | PRN

## 2016-10-21 MED ORDER — POLYVINYL ALCOHOL 1.4 % OP SOLN: 1.0000 [drp] | Freq: Four times a day (QID) | OPHTHALMIC | Status: DC | PRN

## 2016-10-21 MED ORDER — STROKE: EARLY STAGES OF RECOVERY BOOK
Freq: Once | Status: DC
  Filled 2016-10-21: qty 1

## 2016-10-21 MED ORDER — SENNOSIDES-DOCUSATE SODIUM 8.6-50 MG PO TABS
1.0000 | ORAL_TABLET | Freq: Every evening | ORAL | Status: DC | PRN
  Filled 2016-10-21: qty 1

## 2016-10-21 MED ORDER — PRAVASTATIN SODIUM 40 MG PO TABS
40.0000 mg | ORAL_TABLET | Freq: Every day | ORAL | Status: DC
  Administered 2016-10-21 – 2016-10-28 (×8): 40 mg via ORAL
  Filled 2016-10-21 (×9): qty 1

## 2016-10-21 MED ORDER — ACETAMINOPHEN 325 MG PO TABS
650.0000 mg | ORAL_TABLET | ORAL | Status: DC | PRN
  Administered 2016-10-27 – 2016-10-28 (×2): 650 mg via ORAL
  Filled 2016-10-21 (×2): qty 2

## 2016-10-21 MED ORDER — WARFARIN SODIUM 2.5 MG PO TABS
2.5000 mg | ORAL_TABLET | ORAL | Status: DC
  Administered 2016-10-21: 2.5 mg via ORAL
  Filled 2016-10-21 (×2): qty 1

## 2016-10-21 MED ORDER — WARFARIN - PHARMACIST DOSING INPATIENT: Freq: Every day | Status: DC

## 2016-10-21 MED ORDER — GUAIFENESIN ER 600 MG PO TB12
600.0000 mg | ORAL_TABLET | Freq: Two times a day (BID) | ORAL | Status: DC
  Administered 2016-10-21 – 2016-10-29 (×16): 600 mg via ORAL
  Filled 2016-10-21 (×16): qty 1

## 2016-10-21 NOTE — Progress Notes (Signed)
ANTICOAGULATION CONSULT NOTE - Initial Consult  Pharmacy Consult for Coumadin Indication: atrial fibrillation  Allergies  Allergen Reactions  . Amoxicillin     REACTION: rashj  . Penicillins     Has patient had a PCN reaction causing immediate rash, facial/tongue/throat swelling, SOB or lightheadedness with hypotension: no Has patient had a PCN reaction causing severe rash involving mucus membranes or skin necrosis: no Has patient had a PCN reaction that required hospitalization no Has patient had a PCN reaction occurring within the last 10 years: no If all of the above answers are "NO", then may proceed with Cephalosporin use.     Vital Signs: Temp: 97.6 F (36.4 C) (01/31 0246) BP: 98/60 (01/31 0300) Pulse Rate: 50 (01/31 0300)  Labs:  Recent Labs  10/21/16 0204 10/21/16 0237  HGB 12.6 13.3  HCT 40.3 39.0  PLT 187  --   APTT 33  --   LABPROT 24.3*  --   INR 2.15  --   CREATININE 1.16* 1.20*     Medical History: Past Medical History:  Diagnosis Date  . Asthma   . Atrial fibrillation ([place])   . Benign neoplasm of colon 10/22/2005   Hyperplastic  & Adenomatous polyps   . Cardiomegaly 2013   by xray  . Diverticulosis of colon (without mention of hemorrhage)   . External hemorrhoids without mention of complication   . HLD (hyperlipidemia)   . HTN (hypertension)   . Malignant neoplasm of corpus uteri, except isthmus ([place])    followed by [name] with yearly visits  . Onychia and paronychia of toe   . Osteopenia 2017   by xray, but DEXA WNL 2014  . Pneumonia   . Polycythemia 11/2011   normal EPO and periph smear    Assessment: 83yo female presents w/ acute onset of LUE ataxia and loss of hand dexterity, deficits rapidly improved in ED, TIA alert initiated, to continue Coumadin for Afib; current INR at goal w/ last dose of Coumadin taken 1/30 PTA.  Goal of Therapy:  INR 2-3   Plan:  Will continue home Coumadin dose of 5mg  TTSS and 2.5mg  MWF and monitor  INR.  Wynona Neat, PharmD, BCPS  10/21/2016,4:57 AM

## 2016-10-21 NOTE — ED Notes (Signed)
Breakfast tray ordered 

## 2016-10-21 NOTE — Progress Notes (Signed)
Erica Bridges is a 83 yo female from home with new onset Left side deficit, pt LKW at 2300 when she went to bed. Pt awoke at 0030 and noted Left arm weakness/heaviness when she reached for the remote control. CBG 78, NIHSS 1. Pt to be admitted to [facility] for a TIA work up.

## 2016-10-21 NOTE — ED Provider Notes (Signed)
Erica Bridges Note   CSN: [id number] Arrival date & time: 10/21/16  0159   By signing my name below, I, Erica Bridges, attest that this documentation has been prepared under the direction and in the presence of Erica Fraise, MD  Electronically Signed: Delton Bridges, ED Scribe. 10/21/16. 2:58 AM.   History   Chief Complaint Chief Complaint  Patient presents with  . Code Stroke   The history is provided by the EMS personnel. No language interpreter was used.  Weakness  Primary symptoms include focal weakness. This is a new problem. The current episode started 1 to 2 hours ago. There was left upper extremity focality noted. There has been no fever. Pertinent negatives include no chest pain, no vomiting and no altered mental status.   HPI Comments:  Erica Bridges is a 83 y.o. female, with a hx of a-fib on coumadin, HTN, HLD, asthma and cardiomegaly, who presents to the Emergency Department complaining of acute onset left arm weakness and numbness which began around 12:30 AM today. EMS reports the pt awoke and went to grab her remote with her left upper extremity when she noticed the weakness. EMS also reports mild slurred speech. She notes she hasn't felt good for a few days. Pt denies fevers, nausea, vomiting, chest pain, abdominal pain, back pain and syncope. No other associated symptoms noted.    PCP: Erica Bush, MD  Past Medical History:  Diagnosis Date  . Asthma   . Atrial fibrillation ([place])   . Benign neoplasm of colon 10/22/2005   Hyperplastic  & Adenomatous polyps   . Cardiomegaly 2013   by xray  . Diverticulosis of colon (without mention of hemorrhage)   . External hemorrhoids without mention of complication   . HLD (hyperlipidemia)   . HTN (hypertension)   . Malignant neoplasm of corpus uteri, except isthmus ([place])    followed by [name] with yearly visits  . Onychia and paronychia of toe   . Osteopenia 2017   by xray, but DEXA WNL 2014  . Pneumonia    . Polycythemia 11/2011   normal EPO and periph smear    Patient Active Problem List   Diagnosis Date Noted  . Osteopenia 03/02/2016  . Midline low back pain without sciatica 06/20/2015  . Advanced care planning/counseling discussion 01/15/2015  . Dizziness 02/07/2014  . Medicare annual wellness visit, subsequent 12/05/2011  . Polycythemia 12/05/2011  . History of colon polyps 12/05/2011  . UNSPECIFIED MENOPAUSAL&POSTMENOPAUSAL DISORDER 09/19/2008  . CANCER, ENDOMETRIUM 02/22/2007  . HYPERCHOLESTEROLEMIA 02/22/2007  . Essential hypertension 02/22/2007  . REACTIVE AIRWAY DISEASE 02/22/2007  . DIVERTICULOSIS, COLON 02/22/2007  . DERMATITIS, OTHER ATOPIC 02/22/2007  . Atrial fibrillation ([place]) 02/03/2007    Past Surgical History:  Procedure Laterality Date  . CATARACT EXTRACTION Bilateral   . COLONOSCOPY  10/22/05   polyps-bx negative; divertics, ext hemmorhoids  . COLONOSCOPY  03/2013   tubular adenomas, small angiodysplastic lesion at cecum, mod diverticulosis Erica Bridges)  . DEXA  2014   WNL  . GYNECOLOGIC CRYOSURGERY    . LUMBAR DISC SURGERY  1970s  . REFRACTIVE SURGERY Bilateral   . TONSILLECTOMY    . TOTAL ABDOMINAL HYSTERECTOMY W/ BILATERAL SALPINGOOPHORECTOMY  2005   uterine adenocarcinoma    OB History    Gravida Para Term Preterm AB Living   4 3     1 3    SAB TAB Ectopic Multiple Live Births   1  Home Medications    Prior to Admission medications   Medication Sig Start Date End Date Taking? Authorizing Bridges  albuterol (PROVENTIL HFA;VENTOLIN HFA) 108 (90 Base) MCG/ACT inhaler Inhale 2 puffs into the lungs every 6 (six) hours as needed for wheezing or shortness of breath. 08/07/16  Yes Rudene Re, MD  atenolol (TENORMIN) 50 MG tablet TAKE 1 TABLET (50 MG TOTAL) BY MOUTH DAILY. 09/04/16  Yes Erica Bush, MD  BIOTIN PO Take 1 tablet by mouth daily. Reported on 01/17/2016   Yes Historical Provider, MD  Calcium-Magnesium-Vitamin D  (CALCIUM 1200+D3 PO) Take 1 tablet by mouth daily.   Yes Historical Provider, MD  cholecalciferol (VITAMIN D) 1000 UNITS tablet Take 1,000 Units by mouth daily.   Yes Historical Provider, MD  Coenzyme Q10 200 MG capsule Take 200 mg by mouth daily.   Yes Historical Provider, MD  Polyethyl Glycol-Propyl Glycol (SYSTANE) 0.4-0.3 % SOLN Apply 1 drop to eye 4 (four) times daily as needed.    Yes Historical Provider, MD  potassium chloride SA (K-DUR,KLOR-CON) 10 MEQ tablet Take 1 tablet (10 mEq total) by mouth daily. 01/17/16  Yes Erica Bush, MD  pravastatin (PRAVACHOL) 40 MG tablet Take 1 tablet (40 mg total) by mouth daily. 01/17/16  Yes Erica Bush, MD  triamterene-hydrochlorothiazide (MAXZIDE-25) 37.5-25 MG tablet Take 1 tablet by mouth daily. 01/17/16  Yes Erica Bush, MD  warfarin (COUMADIN) 5 MG tablet TAKE AS DIRECTED BY ANTI- COAGULATION CLINIC. Patient taking differently: Take 2.5-5 mg by mouth See admin instructions. Take 1 tablet on Sunday, Tuesday, Thursday, and Saturday then take 1/2 tablet on Monday, Wednesday and Friday 09/04/16  Yes Erica Bush, MD  predniSONE (DELTASONE) 5 MG tablet Take 10mg  daily for 10 days then 5mg  daily for 10 days then 5mg  QOD for 10 days then stop Patient not taking: Reported on 10/21/2016 09/04/16   Erica Bush, MD    Family History Family History  Problem Relation Age of Onset  . Hypertension Father   . Stroke Father   . Diabetes Mother   . Coronary artery disease Mother   . Hypertension Mother   . Colon cancer Neg Hx     Social History Social History  Substance Use Topics  . Smoking status: Former Smoker    Types: Cigarettes    Quit date: 09/21/1982  . Smokeless tobacco: Never Used  . Alcohol use No     Allergies   Amoxicillin and Penicillins   Review of Systems Review of Systems  Constitutional: Negative for fever.  Cardiovascular: Negative for chest pain.  Gastrointestinal: Negative for abdominal pain, nausea and  vomiting.  Musculoskeletal: Negative for back pain.  Neurological: Positive for focal weakness, weakness and numbness. Negative for syncope.  All other systems reviewed and are negative.  Physical Exam Updated Vital Signs Temp 97.6 F (36.4 C)   Physical Exam CONSTITUTIONAL: Well developed/well nourished HEAD: Normocephalic/atraumatic EYES: EOMI/PERRL ENMT: Mucous membranes moist NECK: supple no meningeal signs SPINE/BACK:entire spine nontender Cv: Irregular, no loud murmurs  LUNGS: Lungs are clear to auscultation bilaterally, no apparent distress ABDOMEN: soft, nontender, no rebound or guarding, bowel sounds noted throughout abdomen GU:no cva tenderness NEURO: Pt is awake/alert/appropriate, moves all extremitiesx4.  No facial droop. No arm or leg drift noted.  EXTREMITIES: pulses normal/equal, full ROM SKIN: warm, color normal, chronic skin changes noted to feet PSYCH: no abnormalities of mood noted, alert and oriented to situation  ED Treatments / Results  DIAGNOSTIC STUDIES:  Oxygen Saturation is 97% on [place], normal  by my interpretation.    COORDINATION OF CARE:  2:55 AM Discussed treatment plan with pt at bedside and pt agreed to plan.  Labs (all labs ordered are listed, but only abnormal results are displayed) Labs Reviewed  ETHANOL - Abnormal; Notable for the following:       Result Value   Alcohol, Ethyl (B) 12 (*)    All other components within normal limits  PROTIME-INR - Abnormal; Notable for the following:    Prothrombin Time 24.3 (*)    All other components within normal limits  CBC - Abnormal; Notable for the following:    RDW 16.2 (*)    All other components within normal limits  COMPREHENSIVE METABOLIC PANEL - Abnormal; Notable for the following:    BUN 31 (*)    Creatinine, Ser 1.16 (*)    Total Protein 6.4 (*)    Albumin 3.3 (*)    GFR calc non Af Amer 41 (*)    GFR calc Af Amer 48 (*)    All other components within normal limits  I-STAT CHEM 8,  ED - Abnormal; Notable for the following:    BUN 34 (*)    Creatinine, Ser 1.20 (*)    All other components within normal limits  APTT  DIFFERENTIAL  RAPID URINE DRUG SCREEN, HOSP PERFORMED  URINALYSIS, ROUTINE W REFLEX MICROSCOPIC  I-STAT TROPOININ, ED  CBG MONITORING, ED    EKG  EKG Interpretation  Date/Time:  Wednesday October 21 2016 02:30:29 EST Ventricular Rate:  72 PR Interval:    QRS Duration: 124 QT Interval:  381 QTC Calculation: 417 R Axis:   104 Text Interpretation:  Atrial flutter with predominant 3:1 AV block Nonspecific intraventricular conduction delay Probable anteroseptal infarct, old Borderline repolarization abnormality Confirmed by Christy Gentles  MD, Ulice Follett ([id number]) on 10/21/2016 2:36:18 AM       Radiology Ct Head Code Stroke W/o Cm  Addendum Date: 10/21/2016   ADDENDUM REPORT: 10/21/2016 02:23 ADDENDUM: Critical Value/emergent results were called by telephone at the time of interpretation on 10/21/2016 at 2:23 am to Dr. Cheral Marker, Neurology , who verbally acknowledged these results. Electronically Signed   By: Elon Alas M.D.   On: 10/21/2016 02:23   Result Date: 10/21/2016 CLINICAL DATA:  Code stroke. LEFT-sided weakness. History of hypertension, hyperlipidemia. EXAM: CT HEAD WITHOUT CONTRAST TECHNIQUE: Contiguous axial images were obtained from the base of the skull through the vertex without intravenous contrast. COMPARISON:  None. FINDINGS: BRAIN: The ventricles and sulci are normal for age. No intraparenchymal hemorrhage, mass effect nor midline shift. Patchy supratentorial white matter hypodensities less than expected for patient's age, though non-specific are most compatible with chronic small vessel ischemic disease. No acute large vascular territory infarcts. No abnormal extra-axial fluid collections. Basal cisterns are patent. RIGHT parietal punctate calcification is likely extra-axial. VASCULAR: Mild calcific atherosclerosis of the carotid siphons. SKULL:  No skull fracture. No significant scalp soft tissue swelling. SINUSES/ORBITS: The mastoid air-cells and included paranasal sinuses are well-aerated.The included ocular globes and orbital contents are non-suspicious. OTHER: Calcified pannus about the odontoid process compatible with CPPD. ASPECTS [facility] Stroke Program Early CT Score) - Ganglionic level infarction (caudate, lentiform nuclei, internal capsule, insula, M1-M3 cortex): 7 - Supraganglionic infarction (M4-M6 cortex): 3 Total score (0-10 with 10 being normal): 10 IMPRESSION: 1. Negative CT HEAD for age. 2. ASPECTS is 10. Dr. Cheral Marker, neurology paged on October 21, 2016 at 0218 hours, awaiting return call. Electronically Signed: By: Elon Alas M.D. On: 10/21/2016 02:20  Procedures Procedures (including critical care time)  Medications Ordered in ED Medications - No data to display   Initial Impression / Assessment and Plan / ED Course  I have reviewed the triage vital signs and the nursing notes.  Pertinent labs & imaging results that were available during my care of the patient were reviewed by me and considered in my medical decision making (see chart for details).     tPA in stroke considered but not given due to: Symptoms resolved  3:49 AM Pt stable Symptoms improving  Seen by neuro dr Cheral Marker and he has cancelled code stroke Admit for TIA D/w dr Fuller Plan for admission to medical service  Final Clinical Impressions(s) / ED Diagnoses   Final diagnoses:  Transient cerebral ischemia, unspecified type    New Prescriptions New Prescriptions   No medications on file  I personally performed the services described in this documentation, which was scribed in my presence. The recorded information has been reviewed and is accurate.        Erica Fraise, MD 10/21/16 [phone]

## 2016-10-21 NOTE — Care Management Note (Signed)
Case Management Note  Patient Details  Name: AMYTHEST ORAMA MRN: [id number] Date of Birth: 03/27/1927  Subjective/Objective:                  From home alone. /83 y.o. female with medical history significant of HTN, HLD, afib, on chronic anticoagulation, presumed COPD; who presents with complaints of left hand weakness.  Action/Plan: Admit status OBSERVATION (TIA [transient ischemic attack]); anticipate discharge [place].   Expected Discharge Date:   (unsure)               Expected Discharge Plan:  [place]  In-House Referral:  NA  Discharge planning Services  CM Consult  Post Acute Care Choice:    Choice offered to:     DME Arranged:    DME Agency:     HH Arranged:    HH Agency:     Status of Service:  In process, will continue to follow  If discussed at Long Length of Stay Meetings, dates discussed:    Additional Comments:  Fuller Mandril, RN 10/21/2016, 9:57 AM

## 2016-10-21 NOTE — Evaluation (Signed)
Occupational Therapy Evaluation Patient Details Name: Erica Bridges MRN: [id number] DOB: [birth year] Today's Date: 10/21/2016    History of Present Illness 83 y.o.femalewith medical history significant of HTN, HLD, afib, on chronic anticoagulation, presumed COPD; who presents with complaints of left hand weakness. MRI on 1/31 negative for acute findings.   Clinical Impression   Pt reports she was independent with ADL PTA. Currently pt overall min guard for ADL and min assist for functional mobility. Pt with SOB/audible wheezing during activity with SpO2 down to 76% on 2L; increased supplemental O2 to 4L via [place], with pt performing pursed lip breathing SpO2 >90% throughout rest of activity. Pt currently lives alone but family reports there is potential for 24/7 supervision. At this time, recommending [facility] with 24/7 supervision for follow up. If family unable to provide level of assist/supervision needed, may need to consider post acute rehab placement. Pt would benefit from continued skilled OT to address established goals.    Follow Up Recommendations  Home health OT;Supervision/Assistance - 24 hour    Equipment Recommendations  None recommended by OT    Recommendations for Other Services PT consult     Precautions / Restrictions Precautions Precautions: Fall Precaution Comments: watch O2 sats Restrictions Weight Bearing Restrictions: No      Mobility Bed Mobility Overal bed mobility: Needs Assistance Bed Mobility: Supine to Sit     Supine to sit: Min guard     General bed mobility comments: HOB slightly elevated with minimal use of bed rail. Increased time required.  Transfers Overall transfer level: Needs assistance Equipment used: Rolling walker (2 wheeled) Transfers: Sit to/from Stand Sit to Stand: Min guard         General transfer comment: Cues for hand placement with RW. Increased time required.    Balance Overall balance assessment: Needs  assistance Sitting-balance support: Feet supported;No upper extremity supported Sitting balance-Leahy Scale: Good     Standing balance support: Bilateral upper extremity supported;During functional activity Standing balance-Leahy Scale: Poor                              ADL Overall ADL's : Needs assistance/impaired Eating/Feeding: Set up;Sitting   Grooming: Set up;Supervision/safety;Sitting   Upper Body Bathing: Set up;Supervision/ safety;Sitting   Lower Body Bathing: Min guard;Sit to/from stand   Upper Body Dressing : Set up;Supervision/safety;Sitting   Lower Body Dressing: Min guard;Sit to/from stand Lower Body Dressing Details (indicate cue type and reason): to doff/don socks Toilet Transfer: Minimal assistance;Ambulation;BSC;RW Toilet Transfer Details (indicate cue type and reason): Simulated by sit to stand from EOB with functional mobility in room.         Functional mobility during ADLs: Minimal assistance;Rolling walker General ADL Comments: SpO2 down to 76% on 2L via [place] with EOB activities; increased supplemental O2 to 4L with SpO2 >90% throughout activities (RN present and aware). SOB and wheezing noted with bed mobility and all activities.      Vision Vision Assessment?: No apparent visual deficits   Perception     Praxis      Pertinent Vitals/Pain Pain Assessment: No/denies pain     Hand Dominance Right   Extremity/Trunk Assessment Upper Extremity Assessment Upper Extremity Assessment: Overall WFL for tasks assessed   Lower Extremity Assessment Lower Extremity Assessment: Defer to PT evaluation   Cervical / Trunk Assessment Cervical / Trunk Assessment: Kyphotic   Communication Communication Communication: HOH   Cognition Arousal/Alertness: Awake/alert Behavior During Therapy: [facility]  for tasks assessed/performed Overall Cognitive Status: Within Functional Limits for tasks assessed                     General Comments        Exercises       Shoulder Instructions      Home Living Family/patient expects to be discharged to:: Private residence Living Arrangements: Alone Available Help at Discharge: Family;Available 24 hours/day (may be able to arrange 24/7 supervision if needed) Type of Home: House Home Access: Level entry     Home Layout: One level     Bathroom Shower/Tub: Occupational psychologist: Handicapped height     Home Equipment: Environmental consultant - 4 wheels;Shower seat - built in;Grab bars - toilet;Grab bars - tub/shower          Prior Functioning/Environment Level of Independence: Independent with assistive device(s)        Comments: rollator for mobility        OT Problem List: Decreased activity tolerance;Impaired balance (sitting and/or standing);Decreased knowledge of use of DME or AE;Decreased knowledge of precautions;Cardiopulmonary status limiting activity   OT Treatment/Interventions: Self-care/ADL training;Energy conservation;DME and/or AE instruction;Therapeutic activities;Patient/family education;Balance training    OT Goals(Current goals can be found in the care plan section) Acute Rehab OT Goals Patient Stated Goal: return home OT Goal Formulation: With patient/family Time For Goal Achievement: 11/04/16 Potential to Achieve Goals: Good ADL Goals Pt Will Perform Grooming: with modified independence;standing Pt Will Perform Upper Body Bathing: with modified independence;sitting Pt Will Perform Lower Body Bathing: with modified independence;sit to/from stand Pt Will Perform Upper Body Dressing: with modified independence;sitting Pt Will Perform Lower Body Dressing: with modified independence;sit to/from stand Pt Will Transfer to Toilet: with modified independence;ambulating;regular height toilet;grab bars Pt Will Perform Toileting - Clothing Manipulation and hygiene: sit to/from stand Additional ADL Goal #1: Pt will independently verbally recall 3 energy conservation  strategies.  OT Frequency: Min 2X/week   Barriers to D/C:            Co-evaluation              End of Session Equipment Utilized During Treatment: Gait belt;Rolling walker;Oxygen Nurse Communication: Mobility status;Other (comment) (SpO2)  Activity Tolerance: Patient tolerated treatment well Patient left: in chair;with call bell/phone within reach;with chair alarm set;with nursing/sitter in room;with family/visitor present   Time: [id number] OT Time Calculation (min): 23 min Charges:  OT General Charges $OT Visit: 1 Procedure OT Evaluation $OT Eval Moderate Complexity: 1 Procedure OT Treatments $Self Care/Home Management : 8-22 mins G-Codes: OT G-codes **NOT FOR INPATIENT CLASS** Functional Assessment Tool Used: Clinical judgement Functional Limitation: Self care Self Care Current Status [id number]): At least 1 percent but less than 20 percent impaired, limited or restricted Self Care Goal Status [id number]): At least 1 percent but less than 20 percent impaired, limited or restricted   Binnie Kand M.S., OTR/L Pager: [phone]  10/21/2016, 5:11 PM

## 2016-10-21 NOTE — ED Notes (Signed)
Family remains at bedside , patient is resting on stretcher no complaints

## 2016-10-21 NOTE — ED Notes (Signed)
Transported to Vascular lab 

## 2016-10-21 NOTE — ED Triage Notes (Signed)
Pt brought in by [facility]. Pt complain waking from sleep with left sided hand and arm weakness. Pt LKN 0030. When EMS arrived pt presented with slurred speech and left side deficit. Pt currently on coumadin for afib. Pt Aox4.

## 2016-10-21 NOTE — Consult Note (Signed)
Referring Physician: Dr. Christy Gentles    Chief Complaint: Acute onset of LUE ataxia and hand clumsiness  HPI: Erica Bridges is an 83 y.o. female with a history of atrial fibrillation on warfarin who presents with acute onset of LUE ataxia and hand clumsiness. She was LKN at 2300 when she went to bed. She woke up with the symptoms at [place]. EMS noted slurred speech as well as LUE deficits on arrival. CBG was 78. The patient denies vision loss, confusion or slurred speech.   LSN: [id number] tPA Given: No: Due to NIHSS of 1 with rapidly resolving deficits.  Past Medical History:  Diagnosis Date  . Asthma   . Atrial fibrillation ([place])   . Benign neoplasm of colon 10/22/2005   Hyperplastic  & Adenomatous polyps   . Cardiomegaly 2013   by xray  . Diverticulosis of colon (without mention of hemorrhage)   . External hemorrhoids without mention of complication   . HLD (hyperlipidemia)   . HTN (hypertension)   . Malignant neoplasm of corpus uteri, except isthmus ([place])    followed by [name] with yearly visits  . Onychia and paronychia of toe   . Osteopenia 2017   by xray, but DEXA WNL 2014  . Pneumonia   . Polycythemia 11/2011   normal EPO and periph smear    Past Surgical History:  Procedure Laterality Date  . CATARACT EXTRACTION Bilateral   . COLONOSCOPY  10/22/05   polyps-bx negative; divertics, ext hemmorhoids  . COLONOSCOPY  03/2013   tubular adenomas, small angiodysplastic lesion at cecum, mod diverticulosis [facility])  . DEXA  2014   WNL  . GYNECOLOGIC CRYOSURGERY    . LUMBAR DISC SURGERY  1970s  . REFRACTIVE SURGERY Bilateral   . TONSILLECTOMY    . TOTAL ABDOMINAL HYSTERECTOMY W/ BILATERAL SALPINGOOPHORECTOMY  2005   uterine adenocarcinoma    Family History  Problem Relation Age of Onset  . Hypertension Father   . Stroke Father   . Diabetes Mother   . Coronary artery disease Mother   . Hypertension Mother   . Colon cancer Neg Hx    Social History:  reports that she quit smoking  about 34 years ago. Her smoking use included Cigarettes. She has never used smokeless tobacco. She reports that she does not drink alcohol or use drugs.  Allergies:  Allergies  Allergen Reactions  . Amoxicillin     REACTION: rashj  . Penicillins     Has patient had a PCN reaction causing immediate rash, facial/tongue/throat swelling, SOB or lightheadedness with hypotension: no Has patient had a PCN reaction causing severe rash involving mucus membranes or skin necrosis: no Has patient had a PCN reaction that required hospitalization no Has patient had a PCN reaction occurring within the last 10 years: no If all of the above answers are "NO", then may proceed with Cephalosporin use.     Home Medications:  albuterol (PROVENTIL HFA;VENTOLIN HFA) 108 (90 Base) MCG/ACT inhaler Inhale 2 puffs into the lungs every 6 (six) hours as needed for wheezing or shortness of breath. Rudene Re, MD Needs Review  atenolol (TENORMIN) 50 MG tablet TAKE 1 TABLET (50 MG TOTAL) BY MOUTH DAILY. Ria Bush, MD Needs Review  BIOTIN PO Take 1 tablet by mouth daily. Reported on 01/17/2016 Historical Provider, MD Needs Review  Calcium-Magnesium-Vitamin D (CALCIUM 1200+D3 PO) Take 1 tablet by mouth daily. Historical Provider, MD Needs Review  cholecalciferol (VITAMIN D) 1000 UNITS tablet Take 1,000 Units by mouth daily. Historical Provider,  MD Needs Review  Coenzyme Q10 200 MG capsule Take 200 mg by mouth daily. Historical Provider, MD Needs Review  Polyethyl Glycol-Propyl Glycol (SYSTANE) 0.4-0.3 % SOLN Apply 1 drop to eye 4 (four) times daily as needed.  Historical Provider, MD Needs Review  potassium chloride SA (K-DUR,KLOR-CON) 10 MEQ tablet Take 1 tablet (10 mEq total) by mouth daily. Ria Bush, MD Needs Review  pravastatin (PRAVACHOL) 40 MG tablet Take 1 tablet (40 mg total) by mouth daily. Ria Bush, MD Needs Review  triamterene-hydrochlorothiazide (MAXZIDE-25) 37.5-25 MG tablet Take 1  tablet by mouth daily. Ria Bush, MD Needs Review  warfarin (COUMADIN) 5 MG tablet TAKE AS DIRECTED BY ANTI- COAGULATION CLINIC. Ria Bush, MD Needs Review   Patient taking differently: Take 2.5-5 mg by mouth See admin instructions. Take 1 tablet on Sunday, Tuesday, Thursday, and Saturday then take 1/2 tablet on Monday, Wednesday and Friday    predniSONE (DELTASONE) 5 MG tablet Take 10mg  daily for 10 days then 5mg  daily for 10 days then 5mg  QOD for 10 days then stop Ria Bush, MD Needs Review   Patient not taking: Reported on 10/21/2016       ROS: As per HPI.   Physical Examination: Temperature 97.6 F (36.4 C). O2 saturation during CT was 87%  General exam: [place]/AT. No gross wheezing; respirations unlabored. Mild distal lower extremity edema bilaterally. Some cyanosis of hands noted in addition to coolness to touch.   Neurologic Examination: Ment: Alert and fully oriented except for month. Speech without dysarthria. Intact speech comprehension and fluency. Non-agitated.  CN: PERRL. Visual fields intact. Tracks normally. EOMI without nystagmus. Facial temp sensation normal. VII symmetric. Hard of hearing. Phonation normal. Tongue midline.  Motor: Elevates all 4 extremities without drift or asymmetry. Lower extremities 5/5. Upper extremities 4+/5 bilaterally in context of decreased muscle bulk.  Sensory: Intact to FT and temp x 4 without extinction.  Reflexes: Hypoactive lower extremities, normoactive upper extremities. Toes mute.  Cerebellar: No dystaxia or tremor with FNF bilaterally.  Gait: Deferred.   Results for orders placed or performed during the hospital encounter of 10/21/16 (from the past 48 hour(s))  CBG monitoring, ED     Status: None   Collection Time: 10/21/16  2:00 AM  Result Value Ref Range   Glucose-Capillary 78 65 - 99 mg/dL   Comment 1 Notify RN   CBC     Status: Abnormal   Collection Time: 10/21/16  2:04 AM  Result Value Ref Range   WBC 6.8 4.0  - 10.5 K/uL   RBC 4.32 3.87 - 5.11 MIL/uL   Hemoglobin 12.6 12.0 - 15.0 g/dL   HCT 40.3 36.0 - 46.0 %   MCV 93.3 78.0 - 100.0 fL   MCH 29.2 26.0 - 34.0 pg   MCHC 31.3 30.0 - 36.0 g/dL   RDW 16.2 (H) 11.5 - 15.5 %   Platelets 187 150 - 400 K/uL  Differential     Status: None   Collection Time: 10/21/16  2:04 AM  Result Value Ref Range   Neutrophils Relative % 67 %   Neutro Abs 4.6 1.7 - 7.7 K/uL   Lymphocytes Relative 17 %   Lymphs Abs 1.1 0.7 - 4.0 K/uL   Monocytes Relative 14 %   Monocytes Absolute 0.9 0.1 - 1.0 K/uL   Eosinophils Relative 2 %   Eosinophils Absolute 0.1 0.0 - 0.7 K/uL   Basophils Relative 0 %   Basophils Absolute 0.0 0.0 - 0.1 K/uL  I-stat troponin, ED (  not at [facility])     Status: None   Collection Time: 10/21/16  2:35 AM  Result Value Ref Range   Troponin i, poc 0.02 0.00 - 0.08 ng/mL   Comment 3            Comment: Due to the release kinetics of cTnI, a negative result within the first hours of the onset of symptoms does not rule out myocardial infarction with certainty. If myocardial infarction is still suspected, repeat the test at appropriate intervals.   I-Stat Chem 8, ED  (not at [facility], [facility])     Status: Abnormal   Collection Time: 10/21/16  2:37 AM  Result Value Ref Range   Sodium 141 135 - 145 mmol/L   Potassium 4.8 3.5 - 5.1 mmol/L   Chloride 105 101 - 111 mmol/L   BUN 34 (H) 6 - 20 mg/dL   Creatinine, Ser 1.20 (H) 0.44 - 1.00 mg/dL   Glucose, Bld 90 65 - 99 mg/dL   Calcium, Ion 1.19 1.15 - 1.40 mmol/L   TCO2 32 0 - 100 mmol/L   Hemoglobin 13.3 12.0 - 15.0 g/dL   HCT 39.0 36.0 - 46.0 %   Ct Head Code Stroke W/o Cm  Addendum Date: 10/21/2016   ADDENDUM REPORT: 10/21/2016 02:23 ADDENDUM: Critical Value/emergent results were called by telephone at the time of interpretation on 10/21/2016 at 2:23 am to Dr. Cheral Marker, Neurology , who verbally acknowledged these results. Electronically Signed   By: Elon Alas M.D.   On: 10/21/2016 02:23    Result Date: 10/21/2016 CLINICAL DATA:  Code stroke. LEFT-sided weakness. History of hypertension, hyperlipidemia. EXAM: CT HEAD WITHOUT CONTRAST TECHNIQUE: Contiguous axial images were obtained from the base of the skull through the vertex without intravenous contrast. COMPARISON:  None. FINDINGS: BRAIN: The ventricles and sulci are normal for age. No intraparenchymal hemorrhage, mass effect nor midline shift. Patchy supratentorial white matter hypodensities less than expected for patient's age, though non-specific are most compatible with chronic small vessel ischemic disease. No acute large vascular territory infarcts. No abnormal extra-axial fluid collections. Basal cisterns are patent. RIGHT parietal punctate calcification is likely extra-axial. VASCULAR: Mild calcific atherosclerosis of the carotid siphons. SKULL: No skull fracture. No significant scalp soft tissue swelling. SINUSES/ORBITS: The mastoid air-cells and included paranasal sinuses are well-aerated.The included ocular globes and orbital contents are non-suspicious. OTHER: Calcified pannus about the odontoid process compatible with CPPD. ASPECTS [facility] Stroke Program Early CT Score) - Ganglionic level infarction (caudate, lentiform nuclei, internal capsule, insula, M1-M3 cortex): 7 - Supraganglionic infarction (M4-M6 cortex): 3 Total score (0-10 with 10 being normal): 10 IMPRESSION: 1. Negative CT HEAD for age. 2. ASPECTS is 10. Dr. Cheral Marker, neurology paged on October 21, 2016 at 0218 hours, awaiting return call. Electronically Signed: By: Elon Alas M.D. On: 10/21/2016 02:20    Assessment: 83 y.o. female with acute onset of LUE ataxia and loss of hand dexterity.  1. Overall clinical picture most consistent with TIA or small left cerebellar stroke. Clumsy hand dysarthria syndrome secondary to small pontine or internal capsule lacune is also on the DDx. NIHSS of 1 with rapidly resolving symptoms/signs. Given low NIHSS, benefits of tPA  are outweighed by risks.  2. CT head shows no acute abnormality.  3. Atrial fibrillation, on warfarin. INR is pending. 4. BUN/Cr of 34/1.2 noted in conjunction with decreased hydration of oral mucosa, suggestive of volume depletion.   Recommendations: 1. HgbA1c, fasting lipid panel 2. MRI, MRA  of the brain without  contrast 3. PT consult, OT consult, Speech consult 4. Echocardiogram 5. Carotid ultrasound 6. INR pending. Pharmacy to dose warfarin.  7. Risk factor modification 8. Telemetry monitoring 9. Frequent neuro checks. Code Stroke cancelled and TIA alert initiated.  10. Gentle IV hydration.  11. Permissive HTN x 24 hours.  12. Continue statin.  @Electronically  signed: Dr. Kerney Elbe 10/21/2016, 2:50 AM

## 2016-10-21 NOTE — Progress Notes (Signed)
PROGRESS NOTE    Erica Bridges  [id number] DOB: 1926/10/20 DOA: 10/21/2016 PCP: Ria Bush, MD    Brief Narrative:  83 yo female with htn, dyslipidemia and atrial fibrillation on anticoagulation. Presented with new onset left hand numbness and slurred speech. By the initial physical examination she had recovered her function. Admitted for further workup.    Assessment & Plan:   Principal Problem:   TIA (transient ischemic attack) Active Problems:   HYPERCHOLESTEROLEMIA   Essential hypertension   Atrial fibrillation ([place])   Medicare annual wellness visit, subsequent   Chronic anticoagulation   1. TIA. Recovered hand function, will continue neuro checks, continue antiplatelet therapy and statin therapy, physical therapy evaluation. US carotids with no significant stenosis. Follow on echocardiography. Continue anticoagulation with warfarin.   2. Atrial fibrillation. Rate controlled. Will continue to hold atenolol, will add as needed metoprolol for rate control.  Warfarin for anticoagulation.   3. HTN. Will hold atenolol to prevent worsening hypotension, blood pressure systolic 98 to [id number].   4, Dyslipidemia. Continue statin therapy. LDL 59  5. ckd stage 3. Calculated gfr 42. With baseline cr at 1.16. Will continue conservative care, avoid nephrotoxic medications or hypotension.    DVT prophylaxis: enoxaparin  Code Status: full  Family Communication: I spoke with patient's family at the bedside and all questions were addressed. Disposition Plan: home   Consultants:   Neurology   Procedures:   Antimicrobials:      Subjective: Patient with no chest pain or sob, recovered left hand function and sensation.   Objective: Vitals:   10/21/16 1531 10/21/16 1540 10/21/16 1624 10/21/16 1654  BP:   95/70   Pulse: 79 90 78   Resp: 20 16 17    Temp:      TempSrc:      SpO2: 97% 95% 95%   Weight:    76.2 kg (168 lb 1.6 oz)  Height:    5\' 9"  (1.753 m)     Intake/Output Summary (Last 24 hours) at 10/21/16 1711 Last data filed at 10/21/16 1505  Gross per 24 hour  Intake                0 ml  Output              225 ml  Net             -225 ml   Filed Weights   10/21/16 1654  Weight: 76.2 kg (168 lb 1.6 oz)    Examination:  General exam: Appears calm and comfortable  E ENT: no pallor or icterus, oral mucosa moist.  Respiratory system: Clear to auscultation. Respiratory effort normal. Cardiovascular system: S1 & S2 heard, RRR. No JVD, murmurs, rubs, gallops or clicks. No pedal edema. Gastrointestinal system: Abdomen is nondistended, soft and nontender. No organomegaly or masses felt. Normal bowel sounds heard. Central nervous system: Alert and oriented. No focal neurological deficits. Extremities: Symmetric 5 x 5 power. Skin: No rashes, lesions or ulcers     Data Reviewed: I have personally reviewed following labs and imaging studies  CBC:  Recent Labs Lab 10/21/16 0204 10/21/16 0237  WBC 6.8  --   NEUTROABS 4.6  --   HGB 12.6 13.3  HCT 40.3 39.0  MCV 93.3  --   PLT 187  --    Basic Metabolic Panel:  Recent Labs Lab 10/21/16 0204 10/21/16 0237  NA 140 141  K 4.8 4.8  CL 103 105  CO2 27  --  GLUCOSE 89 90  BUN 31* 34*  CREATININE 1.16* 1.20*  CALCIUM 9.5  --    GFR: Estimated Creatinine Clearance: 34.5 mL/min (by C-G formula based on SCr of 1.2 mg/dL (H)). Liver Function Tests:  Recent Labs Lab 10/21/16 0204  AST 25  ALT 16  ALKPHOS 76  BILITOT 0.7  PROT 6.4*  ALBUMIN 3.3*   No results for input(s): LIPASE, AMYLASE in the last 168 hours. No results for input(s): AMMONIA in the last 168 hours. Coagulation Profile:  Recent Labs Lab 10/21/16 0204  INR 2.15   Cardiac Enzymes: No results for input(s): CKTOTAL, CKMB, CKMBINDEX, TROPONINI in the last 168 hours. BNP (last 3 results) No results for input(s): PROBNP in the last 8760 hours. HbA1C: No results for input(s): HGBA1C in the last 72  hours. CBG:  Recent Labs Lab 10/21/16 0200  GLUCAP 78   Lipid Profile:  Recent Labs  10/21/16 0407  CHOL 149  HDL 78  LDLCALC 59  TRIG 62  CHOLHDL 1.9   Thyroid Function Tests: No results for input(s): TSH, T4TOTAL, FREET4, T3FREE, THYROIDAB in the last 72 hours. Anemia Panel: No results for input(s): VITAMINB12, FOLATE, FERRITIN, TIBC, IRON, RETICCTPCT in the last 72 hours. Sepsis Labs: No results for input(s): PROCALCITON, LATICACIDVEN in the last 168 hours.  No results found for this or any previous visit (from the past 240 hour(s)).       Radiology Studies: Mr Virgel Paling [id number] Contrast  Result Date: 10/21/2016 CLINICAL DATA:  Left hand weakness. EXAM: MRI HEAD WITHOUT CONTRAST MRA HEAD WITHOUT CONTRAST TECHNIQUE: Multiplanar, multiecho pulse sequences of the brain and surrounding structures were obtained without intravenous contrast. Angiographic images of the head were obtained using MRA technique without contrast. COMPARISON:  Head CT 10/21/2016 FINDINGS: MRI HEAD FINDINGS Brain: A partially empty sella is incidentally noted. There is no evidence of acute infarct, intracranial hemorrhage, mass, midline shift, or extra-axial fluid collection. Mild generalized cerebral atrophy is within normal limits for age. Small foci of scattered cerebral white matter T2 hyperintensity are nonspecific but compatible with mild chronic small vessel ischemic disease. There is a chronic lacunar infarct in the body of the right caudate nucleus. Vascular: Major intracranial vascular flow voids are preserved. Skull and upper cervical spine: Unremarkable bone marrow signal. Sinuses/Orbits: Prior bilateral cataract extraction. Paranasal sinuses and mastoid air cells are clear. Other: None. MRA HEAD FINDINGS The visualized distal vertebral arteries are widely patent and codominant. Cerebellar arteries are grossly patent. Right AICA is dominant. Basilar artery is widely patent. There are patent posterior  communicating arteries bilaterally, with a fetal origin of the left PCA noted. No PCA stenosis is seen. The internal carotid arteries are widely patent from skullbase to carotid termini. 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 There is a slightly bulbous appearance of the left ICA at the same level, however a sizable well-defined aneurysm is not identified and the appearance may be due to the vessel's orientation. ACAs and MCAs are patent without evidence of major branch occlusion or significant stenosis. IMPRESSION: 1. No acute intracranial abnormality. 2. Mild chronic small vessel ischemic disease. 3. No major intracranial vessel occlusion or significant stenosis. 4. 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 Electronically Signed   By: Logan Bores M.D.   On: 10/21/2016 13:05   Mr Brain Wo Contrast  Result Date: 10/21/2016 CLINICAL DATA:  Left hand weakness. EXAM: MRI HEAD WITHOUT CONTRAST MRA HEAD WITHOUT CONTRAST TECHNIQUE: Multiplanar, multiecho pulse sequences of the  brain and surrounding structures were obtained without intravenous contrast. Angiographic images of the head were obtained using MRA technique without contrast. COMPARISON:  Head CT 10/21/2016 FINDINGS: MRI HEAD FINDINGS Brain: A partially empty sella is incidentally noted. There is no evidence of acute infarct, intracranial hemorrhage, mass, midline shift, or extra-axial fluid collection. Mild generalized cerebral atrophy is within normal limits for age. Small foci of scattered cerebral white matter T2 hyperintensity are nonspecific but compatible with mild chronic small vessel ischemic disease. There is a chronic lacunar infarct in the body of the right caudate nucleus. Vascular: Major intracranial vascular flow voids are preserved. Skull and upper cervical spine: Unremarkable bone marrow signal. Sinuses/Orbits: Prior bilateral cataract extraction. Paranasal sinuses and mastoid air cells are clear. Other:  None. MRA HEAD FINDINGS The visualized distal vertebral arteries are widely patent and codominant. Cerebellar arteries are grossly patent. Right AICA is dominant. Basilar artery is widely patent. There are patent posterior communicating arteries bilaterally, with a fetal origin of the left PCA noted. No PCA stenosis is seen. The internal carotid arteries are widely patent from skullbase to carotid termini. 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 There is a slightly bulbous appearance of the left ICA at the same level, however a sizable well-defined aneurysm is not identified and the appearance may be due to the vessel's orientation. ACAs and MCAs are patent without evidence of major branch occlusion or significant stenosis. IMPRESSION: 1. No acute intracranial abnormality. 2. Mild chronic small vessel ischemic disease. 3. No major intracranial vessel occlusion or significant stenosis. 4. 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 Electronically Signed   By: Logan Bores M.D.   On: 10/21/2016 13:05   Dg Abd Acute W/chest  Result Date: 10/21/2016 CLINICAL DATA:  Shortness of breath.  Diarrhea. EXAM: DG ABDOMEN ACUTE W/ 1V CHEST COMPARISON:  Chest radiograph 08/07/2016.  Abdomen CT 02/08/2008 FINDINGS: Cardiomegaly is unchanged. Atherosclerosis of the thoracic aorta. Mild hyperinflation and emphysema, stable from prior. No consolidation, pleural fluid or pneumothorax. No pulmonary edema. No free intra- abdominal air. No dilated bowel loops to suggest obstruction. Small to moderate colonic stool burden. There are pelvic phleboliths. Midline upper abdominal calcifications felt be vascular. No definite radiopaque calculi. No acute osseous abnormalities are seen. IMPRESSION: 1. Stable cardiomegaly and thoracic aortic atherosclerosis. Stable hyperinflation and emphysema. No acute chest finding. 2. Normal bowel gas pattern.  No bowel obstruction or free air. Electronically Signed   By:  Jeb Levering M.D.   On: 10/21/2016 05:02   Ct Head Code Stroke W/o Cm  Addendum Date: 10/21/2016   ADDENDUM REPORT: 10/21/2016 02:23 ADDENDUM: Critical Value/emergent results were called by telephone at the time of interpretation on 10/21/2016 at 2:23 am to Dr. Cheral Marker, Neurology , who verbally acknowledged these results. Electronically Signed   By: Elon Alas M.D.   On: 10/21/2016 02:23   Result Date: 10/21/2016 CLINICAL DATA:  Code stroke. LEFT-sided weakness. History of hypertension, hyperlipidemia. EXAM: CT HEAD WITHOUT CONTRAST TECHNIQUE: Contiguous axial images were obtained from the base of the skull through the vertex without intravenous contrast. COMPARISON:  None. FINDINGS: BRAIN: The ventricles and sulci are normal for age. No intraparenchymal hemorrhage, mass effect nor midline shift. Patchy supratentorial white matter hypodensities less than expected for patient's age, though non-specific are most compatible with chronic small vessel ischemic disease. No acute large vascular territory infarcts. No abnormal extra-axial fluid collections. Basal cisterns are patent. RIGHT parietal punctate calcification is likely extra-axial. VASCULAR: Mild calcific atherosclerosis  of the carotid siphons. SKULL: No skull fracture. No significant scalp soft tissue swelling. SINUSES/ORBITS: The mastoid air-cells and included paranasal sinuses are well-aerated.The included ocular globes and orbital contents are non-suspicious. OTHER: Calcified pannus about the odontoid process compatible with CPPD. ASPECTS [facility] Stroke Program Early CT Score) - Ganglionic level infarction (caudate, lentiform nuclei, internal capsule, insula, M1-M3 cortex): 7 - Supraganglionic infarction (M4-M6 cortex): 3 Total score (0-10 with 10 being normal): 10 IMPRESSION: 1. Negative CT HEAD for age. 2. ASPECTS is 10. Dr. Cheral Marker, neurology paged on October 21, 2016 at 0218 hours, awaiting return call. Electronically Signed: By:  Elon Alas M.D. On: 10/21/2016 02:20        Scheduled Meds: .  stroke: mapping our early stages of recovery book   Does not apply Once  . guaiFENesin  600 mg Oral BID  . potassium chloride  10 mEq Oral Daily  . pravastatin  40 mg Oral q1800  . warfarin  2.5 mg Oral Q M,W,F-1800  . [START ON 10/22/2016] warfarin  5 mg Oral Q T,Th,S,Su-1800  . Warfarin - Pharmacist Dosing Inpatient   Does not apply q1800   Continuous Infusions: . sodium chloride 75 mL/hr at 10/21/16 0604     LOS: 0 days     Tawni Millers, MD [facility] Pager [phone]  If 7PM-7AM, please contact night-coverage [email or web address] Password [email or web address] 10/21/2016, 5:11 PM

## 2016-10-21 NOTE — ED Notes (Signed)
Patient wasn't able to complete MRI until it is clear what type of implant she has per MRI .

## 2016-10-21 NOTE — Progress Notes (Signed)
STROKE TEAM PROGRESS NOTE   HISTORY OF PRESENT ILLNESS (per record) Erica Bridges is an 83 y.o. female with a history of atrial fibrillation on warfarin who presents with acute onset of LUE ataxia and hand clumsiness. She was LKN at 2300 10/20/2016 when she went to bed. She woke up with the symptoms at [place]. EMS noted slurred speech as well as LUE deficits on arrival. CBG was 78. The patient denies vision loss, confusion or slurred speech. Patient was not administered IV t-PA secondary to due to NIHSS of 1 with rapidly resolving deficits. She was admitted for further evaluation and treatment.   SUBJECTIVE (INTERVAL HISTORY) Pt son at bedside. She stated that her left hand weakness has resolved. However, her BP still low and she stated that her BP was low for a while and she has intermittent dizziness at home. Her INR has been stable and at goal. Her CUS showed b/l 40-59% stenosis. Needs CTA neck for further evaluation.     OBJECTIVE Temp:  [97.1 F (36.2 C)-98 F (36.7 C)] 97.7 F (36.5 C) (02/01 0956) Pulse Rate:  [71-90] 88 (02/01 0956) Cardiac Rhythm: Atrial fibrillation (02/01 0729) Resp:  [13-20] 13 (02/01 0745) BP: (86-101)/(45-87) 86/45 (02/01 0956) SpO2:  [95 %-98 %] 98 % (02/01 0956) Weight:  [76.2 kg (168 lb 1.6 oz)-76.5 kg (168 lb 10.4 oz)] 76.5 kg (168 lb 10.4 oz) (02/01 0405)  CBC:   Recent Labs Lab 10/21/16 0204 10/21/16 0237  WBC 6.8  --   NEUTROABS 4.6  --   HGB 12.6 13.3  HCT 40.3 39.0  MCV 93.3  --   PLT 187  --     Basic Metabolic Panel:   Recent Labs Lab 10/21/16 0204 10/21/16 0237  NA 140 141  K 4.8 4.8  CL 103 105  CO2 27  --   GLUCOSE 89 90  BUN 31* 34*  CREATININE 1.16* 1.20*  CALCIUM 9.5  --     Lipid Panel:     Component Value Date/Time   CHOL 149 10/21/2016 0407   TRIG 62 10/21/2016 0407   HDL 78 10/21/2016 0407   CHOLHDL 1.9 10/21/2016 0407   VLDL 12 10/21/2016 0407   LDLCALC 59 10/21/2016 0407   HgbA1c:  Lab Results  Component  Value Date   HGBA1C 5.7 (H) 10/21/2016   Urine Drug Screen:     Component Value Date/Time   LABOPIA NONE DETECTED 10/21/2016 0524   COCAINSCRNUR NONE DETECTED 10/21/2016 0524   LABBENZ NONE DETECTED 10/21/2016 0524   AMPHETMU NONE DETECTED 10/21/2016 0524   THCU NONE DETECTED 10/21/2016 0524   LABBARB NONE DETECTED 10/21/2016 0524      IMAGING I have personally reviewed the radiological images below and agree with the radiology interpretations.  Ct Head Code Stroke W/o Cm 10/21/2016 1. Negative CT HEAD for age. 2. ASPECTS is 10.   Mr Brain Wo Contrast 10/21/2016 1. No acute intracranial abnormality. 2. Mild chronic small vessel ischemic disease. 3. No major intracranial vessel occlusion or significant stenosis.   Mr Jodene Nam Head Wo Contrast 10/21/2016 3-4 mm right paraophthalmic ICA aneurysm.   2-D echocardiogram - Left ventricle: The cavity size was normal. Wall thickness was normal. Systolic function was normal. The estimated ejection fraction was in the range of 55% to 60%. Wall motion was normal; there were no regional wall motion abnormalities. Doppler parameters are consistent with high ventricular filling pressure. - Ventricular septum: The contour showed diastolic flattening and systolic flattening. - Aortic valve:  Valve mobility was restricted. - Mitral valve: Calcified annulus. There was mild regurgitation. Valve area by continuity equation (using LVOT flow): 1.14 cm^2. - Left atrium: The atrium was severely dilated. - Right ventricle: The cavity size was moderately dilated. Systolic function was moderately reduced. - Right atrium: The atrium was severely dilated. - Atrial septum: There was an atrial septal aneurysm. - Tricuspid valve: There was severe regurgitation. - Pulmonary arteries: Systolic pressure was moderately to severely increased. PA peak pressure: 62 mm Hg (S). - Pericardium, extracardiac: A trivial pericardial effusion was identified. Impressions:   Normal  LV systolic function; elevated LV filling pressure; D shaped septum; biatrial enlargement; moderate RVE with moderately reduced RV function; mild MR; severe TR with moderate to severe elevation in pulmonary pressure.  Carotid Doppler - The vertebral arteries appear patent with antegrade flow. - Findings consistent with 3 - 86 percent stenosis involving the right internal carotid artery and the left internal carotid artery.  CTA neck pending   PHYSICAL EXAM  Temp:  [97.4 F (36.3 C)-97.7 F (36.5 C)] 97.7 F (36.5 C) (02/01 1530) Pulse Rate:  [86-88] 86 (02/01 1530) Resp:  [13-18] 18 (02/01 1530) BP: (86-100)/(45-63) 100/56 (02/01 1530) SpO2:  [95 %-98 %] 98 % (02/01 1530) Weight:  [168 lb 10.4 oz (76.5 kg)] 168 lb 10.4 oz (76.5 kg) (02/01 0405)  General - Well nourished, well developed, in no apparent distress.  Ophthalmologic - Fundi not visualized due to noncooperative.  Cardiovascular - irregularly irregular heart rate and rhythm.  Mental Status -  Level of arousal and orientation to time, place, and person were intact. Language including expression, naming, repetition, comprehension was assessed and found intact. Fund of Knowledge was assessed and was intact.  Cranial Nerves II - XII - II - Visual field intact OU. III, IV, VI - Extraocular movements intact. V - Facial sensation intact bilaterally. VII - Facial movement intact bilaterally. VIII - Hard of hearing & vestibular intact bilaterally. X - Palate elevates symmetrically. XI - Chin turning & shoulder shrug intact bilaterally. XII - Tongue protrusion intact.  Motor Strength - The patient's strength was normal in all extremities and pronator drift was absent.  Bulk was normal and fasciculations were absent.   Motor Tone - Muscle tone was assessed at the neck and appendages and was normal.  Reflexes - The patient's reflexes were 1+ in all extremities and she had no pathological reflexes.  Sensory - Light touch,  temperature/pinprick were assessed and were symmetrical.    Coordination - The patient had normal movements in the hands and feet with no ataxia or dysmetria.  Tremor was absent.  Gait and Station - deferred.   ASSESSMENT/PLAN [name] is a 83 y.o. female with history of HTN, HLD, afib, on chronic anticoagulation, presumed COPD presenting with LUE ataxia and hand clumsiness. She did not receive IV t-PA due to rapidly resolving deficits.   R brain TIA - etiology not clear, could be related to low BP in the setting of ICA stenosis. cardioembolic due to afib also possible but INR has been at goal and stable  MRI  No acute stroke. small vessel disease   MRA  3-67mm R paraopthalmic ICA aneurysm  Carotid Doppler  bilateral ICA 40-59%  2D Echo  EF 55-60%. No source of embolus   CTA neck pending  LDL 59  HgbA1c 5.7  Warfarin for VTE prophylaxis Diet Heart Room service appropriate? Yes; Fluid consistency: Thin  warfarin daily prior to admission, now  on warfarin daily. INR at goal  Patient counseled to be compliant with her antithrombotic medications  Ongoing aggressive stroke risk factor management  Therapy recommendations:  HH PT and OT  Disposition:  pending   R Carotid stenosis  Carotid Doppler  bilateral ICA 40-59%  CTA neck pending  Atrial Fibrillation  Home anticoagulation:  warfarin daily continued in the hospital  INR 2.15 ->2.5  INR has been stable at goal over time  Continue coumadin  hypotension Hx of Hypertension  BP low and intermittent dizziness at home  Take atenolol at home, recommend to stop - for RVR may consider cardizem  May consider midodrine if still low  Orthostatic vital pending  Long-term BP goal normotensive   Hyperlipidemia  Home meds:  pravachol 40, resumed in hospital  LDL 59, goal < 70  Continue statin at discharge  Other Stroke Risk Factors  Advanced age  Family hx stroke (mother)  Other Active  Problems  Hx benign colon lesion  Chronic kidney disease stage III - Cre 1.20  Hospital day # 0  Rosalin Hawking, MD PhD Stroke Neurology 10/22/2016 8:33 PM   To contact Stroke Continuity provider, please refer to [email or web address] After hours, contact General Neurology

## 2016-10-21 NOTE — H&P (Signed)
History and Physical    Erica Bridges [id number] DOB: April 15, 1927 DOA: 10/21/2016  Referring MD/NP/PA: Dr. Christy Gentles PCP: Ria Bush, MD  Patient coming from: Home via EMS  Chief Complaint: Left hand weakness  HPI: Erica Bridges is a 83 y.o. female with medical history significant of HTN, HLD, afib, on chronic anticoagulation, presumed COPD; who presents with complaints of left hand weakness. She was otherwise in her normal state of health and had fallen asleep in her chair sometime around 11:30 PM. When she woke up around 12:30 this morning she went to reach for the remote using her left hand, but found herself reaching in the wrong direction. Reports associated symptoms of numbness of the left hand/possibly of left-sided face, and some slurred speech. She is unsure if her arm was possibly hanging out the chair and went to sleep. At baseline patient is right-handed, lives alone, and has been able to complete all of her ADLs without assistance. She reports taking her medications as prescribed. Denies any urinary frequency, loss of vision, confusion, chest pain, or shortness of breath. Patient also notes reports of weight loss and lower extremity swelling that has not been new.  ED Course: On admission to the emergency department patient was evaluated and seen have vital signs relatively within normal limits. CT scan of the brain showed no acute abnormalities. Neurology was consulted recommending completion of stroke workup. Patient presents a car that notes previous history of abdominal procedure by Dr. Carlean Purl back in 2014 that notes a requirement of x-rays prior to MRI.  Review of Systems: As per HPI otherwise 10 point review of systems negative.   Past Medical History:  Diagnosis Date  . Asthma   . Atrial fibrillation ([place])   . Benign neoplasm of colon 10/22/2005   Hyperplastic  & Adenomatous polyps   . Cardiomegaly 2013   by xray  . Diverticulosis of colon (without mention of  hemorrhage)   . External hemorrhoids without mention of complication   . HLD (hyperlipidemia)   . HTN (hypertension)   . Malignant neoplasm of corpus uteri, except isthmus ([place])    followed by [name] with yearly visits  . Onychia and paronychia of toe   . Osteopenia 2017   by xray, but DEXA WNL 2014  . Pneumonia   . Polycythemia 11/2011   normal EPO and periph smear    Past Surgical History:  Procedure Laterality Date  . CATARACT EXTRACTION Bilateral   . COLONOSCOPY  10/22/05   polyps-bx negative; divertics, ext hemmorhoids  . COLONOSCOPY  03/2013   tubular adenomas, small angiodysplastic lesion at cecum, mod diverticulosis Carlean Purl)  . DEXA  2014   WNL  . GYNECOLOGIC CRYOSURGERY    . LUMBAR DISC SURGERY  1970s  . REFRACTIVE SURGERY Bilateral   . TONSILLECTOMY    . TOTAL ABDOMINAL HYSTERECTOMY W/ BILATERAL SALPINGOOPHORECTOMY  2005   uterine adenocarcinoma     reports that she quit smoking about 34 years ago. Her smoking use included Cigarettes. She has never used smokeless tobacco. She reports that she does not drink alcohol or use drugs.  Allergies  Allergen Reactions  . Amoxicillin     REACTION: rashj  . Penicillins     Has patient had a PCN reaction causing immediate rash, facial/tongue/throat swelling, SOB or lightheadedness with hypotension: no Has patient had a PCN reaction causing severe rash involving mucus membranes or skin necrosis: no Has patient had a PCN reaction that required hospitalization no Has patient had a PCN  reaction occurring within the last 10 years: no If all of the above answers are "NO", then may proceed with Cephalosporin use.     Family History  Problem Relation Age of Onset  . Hypertension Father   . Stroke Father   . Diabetes Mother   . Coronary artery disease Mother   . Hypertension Mother   . Colon cancer Neg Hx     Prior to Admission medications   Medication Sig Start Date End Date Taking? Authorizing Provider  albuterol  (PROVENTIL HFA;VENTOLIN HFA) 108 (90 Base) MCG/ACT inhaler Inhale 2 puffs into the lungs every 6 (six) hours as needed for wheezing or shortness of breath. 08/07/16  Yes Rudene Re, MD  atenolol (TENORMIN) 50 MG tablet TAKE 1 TABLET (50 MG TOTAL) BY MOUTH DAILY. 09/04/16  Yes Ria Bush, MD  BIOTIN PO Take 1 tablet by mouth daily. Reported on 01/17/2016   Yes Historical Provider, MD  Calcium-Magnesium-Vitamin D (CALCIUM 1200+D3 PO) Take 1 tablet by mouth daily.   Yes Historical Provider, MD  cholecalciferol (VITAMIN D) 1000 UNITS tablet Take 1,000 Units by mouth daily.   Yes Historical Provider, MD  Coenzyme Q10 200 MG capsule Take 200 mg by mouth daily.   Yes Historical Provider, MD  Polyethyl Glycol-Propyl Glycol (SYSTANE) 0.4-0.3 % SOLN Apply 1 drop to eye 4 (four) times daily as needed.    Yes Historical Provider, MD  potassium chloride SA (K-DUR,KLOR-CON) 10 MEQ tablet Take 1 tablet (10 mEq total) by mouth daily. 01/17/16  Yes Ria Bush, MD  pravastatin (PRAVACHOL) 40 MG tablet Take 1 tablet (40 mg total) by mouth daily. 01/17/16  Yes Ria Bush, MD  triamterene-hydrochlorothiazide (MAXZIDE-25) 37.5-25 MG tablet Take 1 tablet by mouth daily. 01/17/16  Yes Ria Bush, MD  warfarin (COUMADIN) 5 MG tablet TAKE AS DIRECTED BY ANTI- COAGULATION CLINIC. Patient taking differently: Take 2.5-5 mg by mouth See admin instructions. Take 1 tablet on Sunday, Tuesday, Thursday, and Saturday then take 1/2 tablet on Monday, Wednesday and Friday 09/04/16  Yes Ria Bush, MD  predniSONE (DELTASONE) 5 MG tablet Take 10mg  daily for 10 days then 5mg  daily for 10 days then 5mg  QOD for 10 days then stop Patient not taking: Reported on 10/21/2016 09/04/16   Ria Bush, MD    Physical Exam:  Constitutional: elderly female in NAD, calm, comfortable Vitals:   10/21/16 0246 10/21/16 0300  BP:  98/60  Pulse:  (!) 50  Resp:  14  Temp: 97.6 F (36.4 C)   SpO2:  97%   Eyes:  PERRL, lids and conjunctivae normal ENMT: Mucous membranes are moist. Posterior pharynx clear of any exudate or lesions.Normal dentition.  Neck: normal, supple, no masses, no thyromegaly Respiratory: clear to auscultation bilaterally, no wheezing, no crackles. Normal respiratory effort. No accessory muscle use.  Cardiovascular: Regular rate and rhythm, no murmurs / rubs / gallops. 1+ pitting lower extremity edema. 2+ pedal pulses. No carotid bruits.  Abdomen: no tenderness, no masses palpated. No hepatosplenomegaly. Bowel sounds positive.  Musculoskeletal: no clubbing / cyanosis. No joint deformity upper and lower extremities. Good ROM, no contractures. Normal muscle tone.  Skin: no rashes, lesions, ulcers. No induration Neurologic: CN 2-12 grossly intact. Sensation intact, DTR normal. Strength 5/5 in all 4.  Psychiatric: Normal judgment and insight. Alert and oriented x 3. Normal mood.     Labs on Admission: I have personally reviewed following labs and imaging studies  CBC:  Recent Labs Lab 10/21/16 0204 10/21/16 0237  WBC 6.8  --  NEUTROABS 4.6  --   HGB 12.6 13.3  HCT 40.3 39.0  MCV 93.3  --   PLT 187  --    Basic Metabolic Panel:  Recent Labs Lab 10/21/16 0204 10/21/16 0237  NA 140 141  K 4.8 4.8  CL 103 105  CO2 27  --   GLUCOSE 89 90  BUN 31* 34*  CREATININE 1.16* 1.20*  CALCIUM 9.5  --    GFR: CrCl cannot be calculated (Unknown ideal weight.). Liver Function Tests:  Recent Labs Lab 10/21/16 0204  AST 25  ALT 16  ALKPHOS 76  BILITOT 0.7  PROT 6.4*  ALBUMIN 3.3*   No results for input(s): LIPASE, AMYLASE in the last 168 hours. No results for input(s): AMMONIA in the last 168 hours. Coagulation Profile:  Recent Labs Lab 10/21/16 0204  INR 2.15   Cardiac Enzymes: No results for input(s): CKTOTAL, CKMB, CKMBINDEX, TROPONINI in the last 168 hours. BNP (last 3 results) No results for input(s): PROBNP in the last 8760 hours. HbA1C: No results  for input(s): HGBA1C in the last 72 hours. CBG:  Recent Labs Lab 10/21/16 0200  GLUCAP 78   Lipid Profile: No results for input(s): CHOL, HDL, LDLCALC, TRIG, CHOLHDL, LDLDIRECT in the last 72 hours. Thyroid Function Tests: No results for input(s): TSH, T4TOTAL, FREET4, T3FREE, THYROIDAB in the last 72 hours. Anemia Panel: No results for input(s): VITAMINB12, FOLATE, FERRITIN, TIBC, IRON, RETICCTPCT in the last 72 hours. Urine analysis:    Component Value Date/Time   BILIRUBINUR Negative 06/20/2015 1146   PROTEINUR Negative 06/20/2015 1146   UROBILINOGEN 0.2 06/20/2015 1146   NITRITE Negative 06/20/2015 1146   LEUKOCYTESUR Negative 06/20/2015 1146   Sepsis Labs: No results found for this or any previous visit (from the past 240 hour(s)).   Radiological Exams on Admission: Ct Head Code Stroke W/o Cm  Addendum Date: 10/21/2016   ADDENDUM REPORT: 10/21/2016 02:23 ADDENDUM: Critical Value/emergent results were called by telephone at the time of interpretation on 10/21/2016 at 2:23 am to Dr. Cheral Marker, Neurology , who verbally acknowledged these results. Electronically Signed   By: Elon Alas M.D.   On: 10/21/2016 02:23   Result Date: 10/21/2016 CLINICAL DATA:  Code stroke. LEFT-sided weakness. History of hypertension, hyperlipidemia. EXAM: CT HEAD WITHOUT CONTRAST TECHNIQUE: Contiguous axial images were obtained from the base of the skull through the vertex without intravenous contrast. COMPARISON:  None. FINDINGS: BRAIN: The ventricles and sulci are normal for age. No intraparenchymal hemorrhage, mass effect nor midline shift. Patchy supratentorial white matter hypodensities less than expected for patient's age, though non-specific are most compatible with chronic small vessel ischemic disease. No acute large vascular territory infarcts. No abnormal extra-axial fluid collections. Basal cisterns are patent. RIGHT parietal punctate calcification is likely extra-axial. VASCULAR: Mild  calcific atherosclerosis of the carotid siphons. SKULL: No skull fracture. No significant scalp soft tissue swelling. SINUSES/ORBITS: The mastoid air-cells and included paranasal sinuses are well-aerated.The included ocular globes and orbital contents are non-suspicious. OTHER: Calcified pannus about the odontoid process compatible with CPPD. ASPECTS [facility] Stroke Program Early CT Score) - Ganglionic level infarction (caudate, lentiform nuclei, internal capsule, insula, M1-M3 cortex): 7 - Supraganglionic infarction (M4-M6 cortex): 3 Total score (0-10 with 10 being normal): 10 IMPRESSION: 1. Negative CT HEAD for age. 2. ASPECTS is 10. Dr. Cheral Marker, neurology paged on October 21, 2016 at 0218 hours, awaiting return call. Electronically Signed: By: Elon Alas M.D. On: 10/21/2016 02:20    EKG: Independently reviewed. Atrial fibrillation  Assessment/Plan TIA (transient ischemic attack): Acute. Patient presents with left-sided and ataxia and numbness. Initial CT scan of the brain negative for any acute abnormalities. - Admit to a telemetry bed - TIA/stroke order set initiated - Neuro checks - Check hemoglobin A1c and lipid panel - Echocardiogram, vascular carotid Doppler ultrasound in a.m. - PT/OT/SP to eval and treat - Appreciate neurology consultative services - Follow-up acute abdominal series and order MRI if patient is able to have one.  Atrial fibrillation on chronic anticoagulation therapy: Patient appears to be rate controlled with therapeutic INR on Coumadin. - Coumadin per pharmacy  Essential hypertension - 24 hours of permissive hypertension - Restart Atenolol and Maxzide when medically appropriate  Hyperlipidemia - Continue pravastatin   History of benign colon lesion: Patient has card which denotes placement of a clip by Dr. Carlean Purl GI [phone] which denotes need x-rays prior to any MRI. - Checking acute abdominal series now  DVT prophylaxis: Coumadin Code Status:  Full Family Communication: Discuss plan of care with the patient Disposition Plan: Likely discharge home in medically stable  Consults called: Neurology Admission status: Observation  Norval Morton MD [facility] Pager [phone]  If 7PM-7AM, please contact night-coverage [email or web address] Password [email or web address]  10/21/2016, 3:56 AM

## 2016-10-21 NOTE — ED Notes (Signed)
Transported to MRI

## 2016-10-21 NOTE — ED Notes (Signed)
Dr. Smith at bedside.

## 2016-10-21 NOTE — ED Notes (Signed)
Patient transported to CT 

## 2016-10-22 ENCOUNTER — Observation Stay (HOSPITAL_COMMUNITY): Payer: Medicare Other

## 2016-10-22 ENCOUNTER — Observation Stay (HOSPITAL_BASED_OUTPATIENT_CLINIC_OR_DEPARTMENT_OTHER): Payer: Medicare Other

## 2016-10-22 DIAGNOSIS — I272 Pulmonary hypertension, unspecified: Secondary | ICD-10-CM | POA: Diagnosis not present

## 2016-10-22 DIAGNOSIS — R2689 Other abnormalities of gait and mobility: Secondary | ICD-10-CM | POA: Diagnosis not present

## 2016-10-22 DIAGNOSIS — Z823 Family history of stroke: Secondary | ICD-10-CM | POA: Diagnosis not present

## 2016-10-22 DIAGNOSIS — G458 Other transient cerebral ischemic attacks and related syndromes: Secondary | ICD-10-CM | POA: Diagnosis not present

## 2016-10-22 DIAGNOSIS — I130 Hypertensive heart and chronic kidney disease with heart failure and stage 1 through stage 4 chronic kidney disease, or unspecified chronic kidney disease: Secondary | ICD-10-CM | POA: Diagnosis present

## 2016-10-22 DIAGNOSIS — M6281 Muscle weakness (generalized): Secondary | ICD-10-CM | POA: Diagnosis not present

## 2016-10-22 DIAGNOSIS — Z79899 Other long term (current) drug therapy: Secondary | ICD-10-CM | POA: Diagnosis not present

## 2016-10-22 DIAGNOSIS — I5033 Acute on chronic diastolic (congestive) heart failure: Secondary | ICD-10-CM | POA: Diagnosis not present

## 2016-10-22 DIAGNOSIS — I509 Heart failure, unspecified: Secondary | ICD-10-CM | POA: Diagnosis not present

## 2016-10-22 DIAGNOSIS — I952 Hypotension due to drugs: Secondary | ICD-10-CM | POA: Diagnosis not present

## 2016-10-22 DIAGNOSIS — Z7901 Long term (current) use of anticoagulants: Secondary | ICD-10-CM | POA: Diagnosis not present

## 2016-10-22 DIAGNOSIS — Z7982 Long term (current) use of aspirin: Secondary | ICD-10-CM | POA: Diagnosis not present

## 2016-10-22 DIAGNOSIS — G459 Transient cerebral ischemic attack, unspecified: Secondary | ICD-10-CM

## 2016-10-22 DIAGNOSIS — I959 Hypotension, unspecified: Secondary | ICD-10-CM | POA: Diagnosis present

## 2016-10-22 DIAGNOSIS — I7410 Embolism and thrombosis of unspecified parts of aorta: Secondary | ICD-10-CM | POA: Diagnosis not present

## 2016-10-22 DIAGNOSIS — E7800 Pure hypercholesterolemia, unspecified: Secondary | ICD-10-CM | POA: Diagnosis not present

## 2016-10-22 DIAGNOSIS — I071 Rheumatic tricuspid insufficiency: Secondary | ICD-10-CM | POA: Diagnosis present

## 2016-10-22 DIAGNOSIS — Z8542 Personal history of malignant neoplasm of other parts of uterus: Secondary | ICD-10-CM | POA: Diagnosis not present

## 2016-10-22 DIAGNOSIS — I482 Chronic atrial fibrillation: Secondary | ICD-10-CM | POA: Diagnosis not present

## 2016-10-22 DIAGNOSIS — N183 Chronic kidney disease, stage 3 (moderate): Secondary | ICD-10-CM | POA: Diagnosis not present

## 2016-10-22 DIAGNOSIS — R0603 Acute respiratory distress: Secondary | ICD-10-CM | POA: Diagnosis not present

## 2016-10-22 DIAGNOSIS — N390 Urinary tract infection, site not specified: Secondary | ICD-10-CM | POA: Diagnosis not present

## 2016-10-22 DIAGNOSIS — R278 Other lack of coordination: Secondary | ICD-10-CM | POA: Diagnosis not present

## 2016-10-22 DIAGNOSIS — G451 Carotid artery syndrome (hemispheric): Secondary | ICD-10-CM | POA: Diagnosis not present

## 2016-10-22 DIAGNOSIS — J449 Chronic obstructive pulmonary disease, unspecified: Secondary | ICD-10-CM | POA: Diagnosis present

## 2016-10-22 DIAGNOSIS — R41841 Cognitive communication deficit: Secondary | ICD-10-CM | POA: Diagnosis not present

## 2016-10-22 DIAGNOSIS — B9620 Unspecified Escherichia coli [E. coli] as the cause of diseases classified elsewhere: Secondary | ICD-10-CM | POA: Diagnosis not present

## 2016-10-22 DIAGNOSIS — Z0000 Encounter for general adult medical examination without abnormal findings: Secondary | ICD-10-CM | POA: Diagnosis not present

## 2016-10-22 DIAGNOSIS — I7411 Embolism and thrombosis of thoracic aorta: Secondary | ICD-10-CM | POA: Diagnosis present

## 2016-10-22 DIAGNOSIS — R531 Weakness: Secondary | ICD-10-CM | POA: Diagnosis not present

## 2016-10-22 DIAGNOSIS — I4891 Unspecified atrial fibrillation: Secondary | ICD-10-CM | POA: Diagnosis not present

## 2016-10-22 DIAGNOSIS — G454 Transient global amnesia: Secondary | ICD-10-CM | POA: Diagnosis not present

## 2016-10-22 DIAGNOSIS — I671 Cerebral aneurysm, nonruptured: Secondary | ICD-10-CM | POA: Diagnosis present

## 2016-10-22 DIAGNOSIS — I10 Essential (primary) hypertension: Secondary | ICD-10-CM | POA: Diagnosis not present

## 2016-10-22 DIAGNOSIS — I513 Intracardiac thrombosis, not elsewhere classified: Secondary | ICD-10-CM | POA: Diagnosis present

## 2016-10-22 DIAGNOSIS — Z87891 Personal history of nicotine dependence: Secondary | ICD-10-CM | POA: Diagnosis not present

## 2016-10-22 DIAGNOSIS — I5031 Acute diastolic (congestive) heart failure: Secondary | ICD-10-CM | POA: Diagnosis not present

## 2016-10-22 DIAGNOSIS — E785 Hyperlipidemia, unspecified: Secondary | ICD-10-CM | POA: Diagnosis not present

## 2016-10-22 DIAGNOSIS — R0602 Shortness of breath: Secondary | ICD-10-CM | POA: Diagnosis not present

## 2016-10-22 LAB — ECHOCARDIOGRAM COMPLETE
Height: 69 in
WEIGHTICAEL: 2698.43 [oz_av]

## 2016-10-22 LAB — HEMOGLOBIN A1C
HEMOGLOBIN A1C: 5.7 % — AB (ref 4.8–5.6)
MEAN PLASMA GLUCOSE: 117 mg/dL

## 2016-10-22 LAB — PROTIME-INR
INR: 2.5
PROTHROMBIN TIME: 27.5 s — AB (ref 11.4–15.2)

## 2016-10-22 LAB — BRAIN NATRIURETIC PEPTIDE: B Natriuretic Peptide: 661.9 pg/mL — ABNORMAL HIGH (ref 0.0–100.0)

## 2016-10-22 MED ORDER — FUROSEMIDE 10 MG/ML IJ SOLN
40.0000 mg | Freq: Two times a day (BID) | INTRAMUSCULAR | Status: DC
  Administered 2016-10-22: 40 mg via INTRAVENOUS
  Filled 2016-10-22: qty 4

## 2016-10-22 MED ORDER — IPRATROPIUM BROMIDE 0.02 % IN SOLN
0.5000 mg | Freq: Once | RESPIRATORY_TRACT | Status: AC
  Administered 2016-10-22: 0.5 mg via RESPIRATORY_TRACT
  Filled 2016-10-22: qty 2.5

## 2016-10-22 MED ORDER — IPRATROPIUM BROMIDE 0.02 % IN SOLN
RESPIRATORY_TRACT | Status: AC
  Filled 2016-10-22: qty 2.5

## 2016-10-22 MED ORDER — FUROSEMIDE 10 MG/ML IJ SOLN
40.0000 mg | Freq: Every day | INTRAMUSCULAR | Status: DC
  Administered 2016-10-23: 40 mg via INTRAVENOUS
  Filled 2016-10-22: qty 4

## 2016-10-22 MED ORDER — ALBUTEROL SULFATE (2.5 MG/3ML) 0.083% IN NEBU
5.0000 mg | INHALATION_SOLUTION | Freq: Once | RESPIRATORY_TRACT | Status: AC
  Administered 2016-10-22: 5 mg via RESPIRATORY_TRACT
  Filled 2016-10-22: qty 6

## 2016-10-22 MED ORDER — ATENOLOL 25 MG PO TABS
50.0000 mg | ORAL_TABLET | Freq: Every day | ORAL | Status: DC
  Administered 2016-10-23: 50 mg via ORAL
  Filled 2016-10-22 (×2): qty 2

## 2016-10-22 NOTE — Progress Notes (Signed)
ANTICOAGULATION CONSULT NOTE - Follow up [place] for warfarin Indication: atrial fibrillation  Allergies  Allergen Reactions  . Amoxicillin     REACTION: rashj  . Penicillins     Has patient had a PCN reaction causing immediate rash, facial/tongue/throat swelling, SOB or lightheadedness with hypotension: no Has patient had a PCN reaction causing severe rash involving mucus membranes or skin necrosis: no Has patient had a PCN reaction that required hospitalization no Has patient had a PCN reaction occurring within the last 10 years: no If all of the above answers are "NO", then may proceed with Cephalosporin use.     Vital Signs: Temp: 97.7 F (36.5 C) (02/01 0956) Temp Source: Oral (02/01 0956) BP: 86/45 (02/01 0956) Pulse Rate: 88 (02/01 0956)  Labs:  Recent Labs  10/21/16 0204 10/21/16 0237 10/22/16 0328  HGB 12.6 13.3  --   HCT 40.3 39.0  --   PLT 187  --   --   APTT 33  --   --   LABPROT 24.3*  --  27.5*  INR 2.15  --  2.50  CREATININE 1.16* 1.20*  --      Medical History: Past Medical History:  Diagnosis Date  . Asthma   . Atrial fibrillation ([place])   . Benign neoplasm of colon 10/22/2005   Hyperplastic  & Adenomatous polyps   . Cardiomegaly 2013   by xray  . Diverticulosis of colon (without mention of hemorrhage)   . External hemorrhoids without mention of complication   . HLD (hyperlipidemia)   . HTN (hypertension)   . Malignant neoplasm of corpus uteri, except isthmus ([place])    followed by [name] with yearly visits  . Onychia and paronychia of toe   . Osteopenia 2017   by xray, but DEXA WNL 2014  . Pneumonia   . Polycythemia 11/2011   normal EPO and periph smear    Assessment: 83yo female presents w/ acute onset of LUE ataxia and loss of hand dexterity, deficits rapidly improved in ED, TIA alert initiated, to continue warfarin for Afib; current INR at goal w/ last dose of warfarin taken 1/30 PTA. INR this am is therapeutic at  2.5. No bleeding noted.  Goal of Therapy:  INR 2-3   Plan:  Will continue home warfarin dose of 5mg  TTSS and 2.5mg  MWF Monitor daily INR, CBC, clinical course, s/sx of bleed, PO intake, DDI   Thank you for allowing [place] to participate in this patients care.  Jens Som, PharmD Clinical phone for 10/22/2016 from 7a-3:30p: [phone] If after 3:30p, please call main pharmacy at: [phone] 10/22/2016 11:12 AM

## 2016-10-22 NOTE — Care Management (Addendum)
Case Management Note Initial Note Started by Felipe Drone RN 10-21-16 Patient Details  Name: Erica Bridges MRN: [id number] Date of Birth: 1926/11/07  Subjective/Objective:                  From home alone. /83 y.o.femalewith medical history significant of HTN, HLD, afib, on chronic anticoagulation, presumed COPD; who presents with complaints of left hand weakness.  Action/Plan: Admit status OBSERVATION (TIA [transient ischemic attack]); anticipate discharge [place].   Expected Discharge Date:   (unsure)                     Expected Discharge Plan:  [place]  In-House Referral:  NA  Discharge planning Services  CM Consult  Post Acute Care Choice:  N/A Choice offered to:  N/A  DME Arranged: N/A   DME Agency:N/A     [place] Arranged:NA    [place]: N/A    Status of Service:  Completed  If discussed at [place] of Stay Meetings, dates discussed:  10-27-16,   Additional Comments: 1534 10-27-16 Jacqlyn Krauss, RN,BSN [phone] CM did speak with pt in regards to disposition needs- pt is agreeable to SNF. CM did receive call from son in regards to plan of care. Son wants patient to go to first choice [facility] and 2nd choice [facility]. CM did relay to son that CSW will need to call and discuss SNF options. CM did relay message to CSW to call son. No further needs at this time.    1136 10-26-16 Jacqlyn Krauss, RN, BSN [phone] CSW is now assisting with disposition needs to SNF. Pt is agreeable to SNF. CM will provide pt with 30 day free card for Eliquis. Pt is without Part D Rx drug coverage. CM will continue to monitor.     1453 10-23-16 Jacqlyn Krauss, RN,BSN [phone] Benefits check in process for Eliquis to see if pt has Rx drug coverage. No coverage found. Pt states she has coverage and uses [facility]. CM did call [facility] and no Rx coverage on file. CM did reach out to son in regards to Rx coverage and [place]  and if pt will have 24 hour supervision once stable for d/c. CM did leave a VM for son in regards to above. CM will continue to monitor.     1236 10-22-16 Jacqlyn Krauss, RN,BSN [phone] CM did speak with pt in regards to disposition needs. Pt is from home alone. PTA pt states she is still driving. Pt has a RW at home. Pt states she has 3 children. CM did speak with pt in regards to [place] and recommendations for 24 hour supervision. Patient states her children work. CM did offer the personal care list for out of pocket services. Pt states she will need to discuss with family. Son to visit after 3pm today. CM will speak with son once arrives.

## 2016-10-22 NOTE — Progress Notes (Signed)
  Echocardiogram 2D Echocardiogram has been performed.  Tresa Res 10/22/2016, 12:31 PM

## 2016-10-22 NOTE — Significant Event (Signed)
Rapid Response Event Note RN called for increased WOB and pt leaning to her Left side Overview: Time Called: 0453 Arrival Time: 0455 Event Type: Respiratory, Neurologic  Initial Focused Assessment: Upon arrival pt resting, RR even and unlabored, no accessory muscle use noted, pt leaning to her Left side. Pt easily aroused to verbal stimuli, answer all questions appropriately, able to reposition herself in the bed for RN to complete a neuro exam. MAE, no weakness or deficits noted, no facial droop, follows commands. Lungs diminished bilaterally at the bases and expiratory wheezing. Pt received breathing treatment at 0449, PTA.   Interventions: CXR ordered. K. Schorr placed new orders for a second breathing treatment, RN to complete.  Plan of Care (if not transferred): Santiago Glad RN aware of plan, call RRT for any concerns Event Summary: Name of Physician Notified: Lamar Blinks NP  at [place]    at    Outcome: Stayed in room and stabalized     [place], [place]

## 2016-10-22 NOTE — Progress Notes (Signed)
PROGRESS NOTE    Erica Bridges  [id number] DOB: March 11, 1927 DOA: 10/21/2016 PCP: Ria Bush, MD    Brief Narrative:  83 yo female with htn, dyslipidemia and atrial fibrillation on anticoagulation. Presented with new onset left hand numbness and slurred speech. By the initial physical examination she had recovered her function. Admitted for further workup.  2/1 am with resp distress, CXR with CHF  Assessment & Plan:    1. TIA. -L sided numbness resolved -MRI without acute abnormality -FU ECHO and Carotid duplex -Continue anticoagulation with warfarin.  -LDL 59, Hba1c 5.7, continue statin -Pt/OT/SLP evals  2. Acute Pulm edema -CHF, pt reports being on lasix dose lowered last year -FU ECHO -IV lasix today -follow I/O, bmet in am  2. Atrial fibrillation.  -resume atenolol, chads2vasc score >3, continue Warfarin for anticoagulation.   3. HTN.  -resume atenolol  4, Dyslipidemia.  -Continue statin therapy. LDL 59  5. ckd stage 3.  -baseline cr at 1.16.  -monitor with diuretics  DVT prophylaxis: enoxaparin  Code Status: full  Family Communication:daughter at bedside  Disposition Plan: home when resp status improves  Consultants:   Neurology   Procedures:   Antimicrobials:      Subjective: Very short of breath this am  Objective: Vitals:   10/22/16 0405 10/22/16 0745 10/22/16 0823 10/22/16 0956  BP: (!) 91/52 91/63  (!) 86/45  Pulse: 88 88  88  Resp: 18 13    Temp: 97.4 F (36.3 C)  97.6 F (36.4 C) 97.7 F (36.5 C)  TempSrc: Axillary  Oral Oral  SpO2: 95% 98%  98%  Weight: 76.5 kg (168 lb 10.4 oz)     Height:        Intake/Output Summary (Last 24 hours) at 10/22/16 1356 Last data filed at 10/22/16 1300  Gross per 24 hour  Intake             2050 ml  Output              575 ml  Net             1475 ml   Filed Weights   10/21/16 1654 10/22/16 0405  Weight: 76.2 kg (168 lb 1.6 oz) 76.5 kg (168 lb 10.4 oz)    Examination:  General  exam: Appears dyspneic, mild distress  E ENT: no pallor or icterus, oral mucosa moist.  Respiratory system: crackles in both legs Cardiovascular system: S1 & S2 heard, RRR., 1plus edema Gastrointestinal system: Abdomen is nondistended, soft and nontender. Normal bowel sounds heard. Central nervous system: Alert and oriented. No focal neurological deficits. Extremities: Symmetric 5 x 5 power. Skin: No rashes, lesions or ulcers     Data Reviewed: I have personally reviewed following labs and imaging studies  CBC:  Recent Labs Lab 10/21/16 0204 10/21/16 0237  WBC 6.8  --   NEUTROABS 4.6  --   HGB 12.6 13.3  HCT 40.3 39.0  MCV 93.3  --   PLT 187  --    Basic Metabolic Panel:  Recent Labs Lab 10/21/16 0204 10/21/16 0237  NA 140 141  K 4.8 4.8  CL 103 105  CO2 27  --   GLUCOSE 89 90  BUN 31* 34*  CREATININE 1.16* 1.20*  CALCIUM 9.5  --    GFR: Estimated Creatinine Clearance: 34.5 mL/min (by C-G formula based on SCr of 1.2 mg/dL (H)). Liver Function Tests:  Recent Labs Lab 10/21/16 0204  AST 25  ALT 16  ALKPHOS 76  BILITOT 0.7  PROT 6.4*  ALBUMIN 3.3*   No results for input(s): LIPASE, AMYLASE in the last 168 hours. No results for input(s): AMMONIA in the last 168 hours. Coagulation Profile:  Recent Labs Lab 10/21/16 0204 10/22/16 0328  INR 2.15 2.50   Cardiac Enzymes: No results for input(s): CKTOTAL, CKMB, CKMBINDEX, TROPONINI in the last 168 hours. BNP (last 3 results) No results for input(s): PROBNP in the last 8760 hours. HbA1C:  Recent Labs  10/21/16 0407  HGBA1C 5.7*   CBG:  Recent Labs Lab 10/21/16 0200  GLUCAP 78   Lipid Profile:  Recent Labs  10/21/16 0407  CHOL 149  HDL 78  LDLCALC 59  TRIG 62  CHOLHDL 1.9   Thyroid Function Tests: No results for input(s): TSH, T4TOTAL, FREET4, T3FREE, THYROIDAB in the last 72 hours. Anemia Panel: No results for input(s): VITAMINB12, FOLATE, FERRITIN, TIBC, IRON, RETICCTPCT in the  last 72 hours. Sepsis Labs: No results for input(s): PROCALCITON, LATICACIDVEN in the last 168 hours.  No results found for this or any previous visit (from the past 240 hour(s)).       Radiology Studies: Mr Virgel Paling [id number] Contrast  Result Date: 10/21/2016 CLINICAL DATA:  Left hand weakness. EXAM: MRI HEAD WITHOUT CONTRAST MRA HEAD WITHOUT CONTRAST TECHNIQUE: Multiplanar, multiecho pulse sequences of the brain and surrounding structures were obtained without intravenous contrast. Angiographic images of the head were obtained using MRA technique without contrast. COMPARISON:  Head CT 10/21/2016 FINDINGS: MRI HEAD FINDINGS Brain: A partially empty sella is incidentally noted. There is no evidence of acute infarct, intracranial hemorrhage, mass, midline shift, or extra-axial fluid collection. Mild generalized cerebral atrophy is within normal limits for age. Small foci of scattered cerebral white matter T2 hyperintensity are nonspecific but compatible with mild chronic small vessel ischemic disease. There is a chronic lacunar infarct in the body of the right caudate nucleus. Vascular: Major intracranial vascular flow voids are preserved. Skull and upper cervical spine: Unremarkable bone marrow signal. Sinuses/Orbits: Prior bilateral cataract extraction. Paranasal sinuses and mastoid air cells are clear. Other: None. MRA HEAD FINDINGS The visualized distal vertebral arteries are widely patent and codominant. Cerebellar arteries are grossly patent. Right AICA is dominant. Basilar artery is widely patent. There are patent posterior communicating arteries bilaterally, with a fetal origin of the left PCA noted. No PCA stenosis is seen. The internal carotid arteries are widely patent from skullbase to carotid termini. 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 There is a slightly bulbous appearance of the left ICA at the same level, however a sizable well-defined aneurysm is not  identified and the appearance may be due to the vessel's orientation. ACAs and MCAs are patent without evidence of major branch occlusion or significant stenosis. IMPRESSION: 1. No acute intracranial abnormality. 2. Mild chronic small vessel ischemic disease. 3. No major intracranial vessel occlusion or significant stenosis. 4. 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 Electronically Signed   By: Logan Bores M.D.   On: 10/21/2016 13:05   Mr Brain Wo Contrast  Result Date: 10/21/2016 CLINICAL DATA:  Left hand weakness. EXAM: MRI HEAD WITHOUT CONTRAST MRA HEAD WITHOUT CONTRAST TECHNIQUE: Multiplanar, multiecho pulse sequences of the brain and surrounding structures were obtained without intravenous contrast. Angiographic images of the head were obtained using MRA technique without contrast. COMPARISON:  Head CT 10/21/2016 FINDINGS: MRI HEAD FINDINGS Brain: A partially empty sella is incidentally noted. There is no evidence of acute infarct, intracranial hemorrhage,  mass, midline shift, or extra-axial fluid collection. Mild generalized cerebral atrophy is within normal limits for age. Small foci of scattered cerebral white matter T2 hyperintensity are nonspecific but compatible with mild chronic small vessel ischemic disease. There is a chronic lacunar infarct in the body of the right caudate nucleus. Vascular: Major intracranial vascular flow voids are preserved. Skull and upper cervical spine: Unremarkable bone marrow signal. Sinuses/Orbits: Prior bilateral cataract extraction. Paranasal sinuses and mastoid air cells are clear. Other: None. MRA HEAD FINDINGS The visualized distal vertebral arteries are widely patent and codominant. Cerebellar arteries are grossly patent. Right AICA is dominant. Basilar artery is widely patent. There are patent posterior communicating arteries bilaterally, with a fetal origin of the left PCA noted. No PCA stenosis is seen. The internal carotid arteries are widely patent from  skullbase to carotid termini. 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 There is a slightly bulbous appearance of the left ICA at the same level, however a sizable well-defined aneurysm is not identified and the appearance may be due to the vessel's orientation. ACAs and MCAs are patent without evidence of major branch occlusion or significant stenosis. IMPRESSION: 1. No acute intracranial abnormality. 2. Mild chronic small vessel ischemic disease. 3. No major intracranial vessel occlusion or significant stenosis. 4. 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 Electronically Signed   By: Logan Bores M.D.   On: 10/21/2016 13:05   Dg Chest Port 1 View  Result Date: 10/22/2016 CLINICAL DATA:  Acute respiratory distress. EXAM: PORTABLE CHEST 1 VIEW COMPARISON:  Radiographs yesterday. FINDINGS: Cardiomegaly, with question progression versus differences in technique. Increased peribronchial and interstitial thickening from prior. Probable left pleural effusion. No confluent airspace disease. No pneumothorax. IMPRESSION: Cardiomegaly, possible progression. Increased peribronchial and interstitial thickening may be pulmonary edema or infectious/ inflammatory. Suspect small left pleural effusion. Electronically Signed   By: Jeb Levering M.D.   On: 10/22/2016 06:01   Dg Abd Acute W/chest  Result Date: 10/21/2016 CLINICAL DATA:  Shortness of breath.  Diarrhea. EXAM: DG ABDOMEN ACUTE W/ 1V CHEST COMPARISON:  Chest radiograph 08/07/2016.  Abdomen CT 02/08/2008 FINDINGS: Cardiomegaly is unchanged. Atherosclerosis of the thoracic aorta. Mild hyperinflation and emphysema, stable from prior. No consolidation, pleural fluid or pneumothorax. No pulmonary edema. No free intra- abdominal air. No dilated bowel loops to suggest obstruction. Small to moderate colonic stool burden. There are pelvic phleboliths. Midline upper abdominal calcifications felt be vascular. No definite radiopaque calculi.  No acute osseous abnormalities are seen. IMPRESSION: 1. Stable cardiomegaly and thoracic aortic atherosclerosis. Stable hyperinflation and emphysema. No acute chest finding. 2. Normal bowel gas pattern.  No bowel obstruction or free air. Electronically Signed   By: Jeb Levering M.D.   On: 10/21/2016 05:02   Ct Head Code Stroke W/o Cm  Addendum Date: 10/21/2016   ADDENDUM REPORT: 10/21/2016 02:23 ADDENDUM: Critical Value/emergent results were called by telephone at the time of interpretation on 10/21/2016 at 2:23 am to Dr. Cheral Marker, Neurology , who verbally acknowledged these results. Electronically Signed   By: Elon Alas M.D.   On: 10/21/2016 02:23   Result Date: 10/21/2016 CLINICAL DATA:  Code stroke. LEFT-sided weakness. History of hypertension, hyperlipidemia. EXAM: CT HEAD WITHOUT CONTRAST TECHNIQUE: Contiguous axial images were obtained from the base of the skull through the vertex without intravenous contrast. COMPARISON:  None. FINDINGS: BRAIN: The ventricles and sulci are normal for age. No intraparenchymal hemorrhage, mass effect nor midline shift. Patchy supratentorial white matter hypodensities less than expected  for patient's age, though non-specific are most compatible with chronic small vessel ischemic disease. No acute large vascular territory infarcts. No abnormal extra-axial fluid collections. Basal cisterns are patent. RIGHT parietal punctate calcification is likely extra-axial. VASCULAR: Mild calcific atherosclerosis of the carotid siphons. SKULL: No skull fracture. No significant scalp soft tissue swelling. SINUSES/ORBITS: The mastoid air-cells and included paranasal sinuses are well-aerated.The included ocular globes and orbital contents are non-suspicious. OTHER: Calcified pannus about the odontoid process compatible with CPPD. ASPECTS [facility] Stroke Program Early CT Score) - Ganglionic level infarction (caudate, lentiform nuclei, internal capsule, insula, M1-M3 cortex): 7 -  Supraganglionic infarction (M4-M6 cortex): 3 Total score (0-10 with 10 being normal): 10 IMPRESSION: 1. Negative CT HEAD for age. 2. ASPECTS is 10. Dr. Cheral Marker, neurology paged on October 21, 2016 at 0218 hours, awaiting return call. Electronically Signed: By: Elon Alas M.D. On: 10/21/2016 02:20        Scheduled Meds: .  stroke: mapping our early stages of recovery book   Does not apply Once  . atenolol  50 mg Oral Daily  . furosemide  40 mg Intravenous Q12H  . guaiFENesin  600 mg Oral BID  . potassium chloride  10 mEq Oral Daily  . pravastatin  40 mg Oral q1800  . warfarin  2.5 mg Oral Q M,W,F-1800  . warfarin  5 mg Oral Q T,Th,S,Su-1800  . Warfarin - Pharmacist Dosing Inpatient   Does not apply q1800   Continuous Infusions:    LOS: 0 days     Domenic Polite, MD [facility] Pager [phone] If 7PM-7AM, please contact night-coverage [email or web address] Password [email or web address] 10/22/2016, 1:56 PM

## 2016-10-22 NOTE — Progress Notes (Signed)
[facility] paged due to patient having expiratory wheezing lungs bilaterally and diminished bilaterally in lung bases post PRN nebulizer treatment.  Patient using some accessory muscles to breath.  Rapid response called to evaluate.   4L nasal cannula, oxygen saturation 97%, respirations 15.

## 2016-10-22 NOTE — Evaluation (Signed)
Physical Therapy Evaluation Patient Details Name: Erica Bridges MRN: [id number] DOB: April 06, 1927 Today's Date: 10/22/2016   History of Present Illness  83 y.o.femalewith medical history significant of HTN, HLD, afib, on chronic anticoagulation, presumed COPD; who presents with complaints of left hand weakness. MRI on 1/31 negative for acute findings.  Clinical Impression  Patient presents with dyspnea on exertion, generalized weakness ,dizziness, soft BP, impaired balance and overall mobility s/p above. Tolerated gait training with Min guard assist for safety due to dizziness. See BP below. Sitting BP92/53 Standing BP 95/62 Sitting BP post ambulation 91/51 Pt's Sp02 dropped to 84% on 3L/min 02 during ambulation and reported dizziness. Therapist forced pt to sit to rest despite complaints of dizziness. Concern about safety awareness esp when symptomatic. Pt reports falls. Recommend initial 24/7 supervision for at least 1-2 weeks. Will follow acutely to maximize independence and mobility prior to return home.    Follow Up Recommendations Home health PT;Supervision/Assistance - 24 hour;Supervision for mobility/OOB    Equipment Recommendations  None recommended by PT    Recommendations for Other Services OT consult     Precautions / Restrictions Precautions Precautions: Fall Precaution Comments: watch O2 sats Restrictions Weight Bearing Restrictions: No      Mobility  Bed Mobility               General bed mobility comments: up in chair upon PT arrival.   Transfers Overall transfer level: Needs assistance Equipment used: Rolling walker (2 wheeled) Transfers: Sit to/from Stand Sit to Stand: Min guard         General transfer comment: Cues for hand placement with RW. Increased time required. Stood from Automotive engineer.   Ambulation/Gait Ambulation/Gait assistance: Min guard Ambulation Distance (Feet): 80 Feet (+50') Assistive device: Rolling walker (2 wheeled) Gait  Pattern/deviations: Step-through pattern;Decreased stride length;Trunk flexed Gait velocity: decreased   General Gait Details: Slow, mildly unsteady gait with Sp02 dropping to 84% on 3L/min 02. 1 seated rest break. Dizziness reported. Pt with decreased self awareness of need to sit when dizzy.  Stairs            Wheelchair Mobility    Modified Rankin (Stroke Patients Only)       Balance Overall balance assessment: Needs assistance Sitting-balance support: Feet supported;No upper extremity supported Sitting balance-Leahy Scale: Good     Standing balance support: During functional activity;Bilateral upper extremity supported Standing balance-Leahy Scale: Poor Standing balance comment: Relient on BUEs for support in standing.                             Pertinent Vitals/Pain Pain Assessment: No/denies pain    Home Living Family/patient expects to be discharged to:: Private residence Living Arrangements: Alone;Children Available Help at Discharge: Family;Available 24 hours/day Type of Home: House Home Access: Level entry     Home Layout: One level Home Equipment: Walker - 4 wheels;Shower seat - built in;Grab bars - toilet;Grab bars - tub/shower      Prior Function Level of Independence: Independent with assistive device(s)         Comments: rollator for mobility; reports some falls.     Hand Dominance   Dominant Hand: Right    Extremity/Trunk Assessment   Upper Extremity Assessment Upper Extremity Assessment: Defer to OT evaluation    Lower Extremity Assessment Lower Extremity Assessment: Generalized weakness    Cervical / Trunk Assessment Cervical / Trunk Assessment: Kyphotic  Communication   Communication: [facility]  Cognition Arousal/Alertness: Awake/alert Behavior During Therapy: WFL for tasks assessed/performed Overall Cognitive Status: Within Functional Limits for tasks assessed                      General Comments General  comments (skin integrity, edema, etc.): [place] rpresent during session.    Exercises     Assessment/Plan    PT Assessment Patient needs continued PT services  PT Problem List Decreased strength;Decreased mobility;Cardiopulmonary status limiting activity;Decreased activity tolerance;Decreased balance          PT Treatment Interventions Therapeutic activities;Gait training;Therapeutic exercise;Patient/family education;Balance training;Functional mobility training    PT Goals (Current goals can be found in the Care Plan section)  Acute Rehab PT Goals Patient Stated Goal: return home PT Goal Formulation: With patient Time For Goal Achievement: 11/05/16 Potential to Achieve Goals: Good    Frequency Min 3X/week   Barriers to discharge Decreased caregiver support lives alone    Co-evaluation               End of Session Equipment Utilized During Treatment: Gait belt;Oxygen Activity Tolerance: Treatment limited secondary to medical complications (Comment) (drop in Sp02 with mobility.) Patient left: in chair;with call bell/phone within reach;with chair alarm set;with nursing/sitter in room Nurse Communication: Mobility status    Functional Assessment Tool Used: clinical judgement Functional Limitation: Mobility: Walking and moving around Mobility: Walking and Moving Around Current Status [id number]): At least 1 percent but less than 20 percent impaired, limited or restricted Mobility: Walking and Moving Around Goal Status [phone]): At least 1 percent but less than 20 percent impaired, limited or restricted    Time: [id number] PT Time Calculation (min) (ACUTE ONLY): 25 min   Charges:   PT Evaluation $PT Eval Moderate Complexity: 1 Procedure PT Treatments $Gait Training: 8-22 mins   PT G Codes:   PT G-Codes **NOT FOR INPATIENT CLASS** Functional Assessment Tool Used: clinical judgement Functional Limitation: Mobility: Walking and moving around Mobility: Walking and Moving  Around Current Status [id number]): At least 1 percent but less than 20 percent impaired, limited or restricted Mobility: Walking and Moving Around Goal Status [phone]): At least 1 percent but less than 20 percent impaired, limited or restricted    [place] 10/22/2016, 10:34 AM Wray Kearns, PT, DPT [phone]

## 2016-10-23 ENCOUNTER — Inpatient Hospital Stay (HOSPITAL_COMMUNITY): Payer: Medicare Other

## 2016-10-23 DIAGNOSIS — I482 Chronic atrial fibrillation: Secondary | ICD-10-CM

## 2016-10-23 DIAGNOSIS — I10 Essential (primary) hypertension: Secondary | ICD-10-CM

## 2016-10-23 DIAGNOSIS — G451 Carotid artery syndrome (hemispheric): Secondary | ICD-10-CM

## 2016-10-23 DIAGNOSIS — I5031 Acute diastolic (congestive) heart failure: Secondary | ICD-10-CM

## 2016-10-23 DIAGNOSIS — I7410 Embolism and thrombosis of unspecified parts of aorta: Secondary | ICD-10-CM

## 2016-10-23 DIAGNOSIS — R0603 Acute respiratory distress: Secondary | ICD-10-CM

## 2016-10-23 DIAGNOSIS — R0602 Shortness of breath: Secondary | ICD-10-CM

## 2016-10-23 DIAGNOSIS — G459 Transient cerebral ischemic attack, unspecified: Principal | ICD-10-CM

## 2016-10-23 DIAGNOSIS — Z7901 Long term (current) use of anticoagulants: Secondary | ICD-10-CM

## 2016-10-23 LAB — PROTIME-INR
INR: 2.55
PROTHROMBIN TIME: 27.9 s — AB (ref 11.4–15.2)

## 2016-10-23 LAB — BASIC METABOLIC PANEL
ANION GAP: 6 (ref 5–15)
BUN: 25 mg/dL — ABNORMAL HIGH (ref 6–20)
CO2: 30 mmol/L (ref 22–32)
Calcium: 8.7 mg/dL — ABNORMAL LOW (ref 8.9–10.3)
Chloride: 102 mmol/L (ref 101–111)
Creatinine, Ser: 1.05 mg/dL — ABNORMAL HIGH (ref 0.44–1.00)
GFR calc Af Amer: 54 mL/min — ABNORMAL LOW (ref >60)
GFR, EST NON AFRICAN AMERICAN: 46 mL/min — AB (ref >60)
Glucose, Bld: 89 mg/dL (ref 65–99)
POTASSIUM: 4.3 mmol/L (ref 3.5–5.1)
SODIUM: 138 mmol/L (ref 135–145)

## 2016-10-23 MED ORDER — ASPIRIN EC 81 MG PO TBEC
81.0000 mg | DELAYED_RELEASE_TABLET | Freq: Every day | ORAL | Status: DC
  Administered 2016-10-24 – 2016-10-29 (×6): 81 mg via ORAL
  Filled 2016-10-23 (×6): qty 1

## 2016-10-23 MED ORDER — FUROSEMIDE 10 MG/ML IJ SOLN: 20.0000 mg | Freq: Every day | INTRAMUSCULAR | Status: DC

## 2016-10-23 MED ORDER — ATENOLOL 25 MG PO TABS
12.5000 mg | ORAL_TABLET | Freq: Every day | ORAL | Status: DC
  Filled 2016-10-23 (×2): qty 1

## 2016-10-23 MED ORDER — FUROSEMIDE 10 MG/ML IJ SOLN: 40.0000 mg | Freq: Every day | INTRAMUSCULAR | Status: DC

## 2016-10-23 MED ORDER — FUROSEMIDE 10 MG/ML IJ SOLN
40.0000 mg | Freq: Two times a day (BID) | INTRAMUSCULAR | Status: DC
  Administered 2016-10-23: 40 mg via INTRAVENOUS
  Filled 2016-10-23 (×3): qty 4

## 2016-10-23 MED ORDER — FUROSEMIDE 10 MG/ML IJ SOLN
20.0000 mg | Freq: Every day | INTRAMUSCULAR | Status: DC
Start: 2016-10-23 — End: 2016-10-23

## 2016-10-23 NOTE — Progress Notes (Signed)
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 The patient was examined and her CTA as well as her echocardiogram were personally reviewed and counseled with the patient.  The patient had a recent stroke with transient loss of speech. Echocardiogram shows severe right-sided RV dysfunction, severe TR and pulmonary hypertension with dilated atria. There is no evidence of vegetations on her aortic or mitral valve.  The CTA of her thoracic aorta and arch show no dilatation of the vessel but heavy calcification and stenosis at the origin of the left subclavian. The thrombus is noted on the medial aspect of the ascending aorta and protrudes into the lumen.  The patient is not a candidate for open surgical repair of her thoracic aortic disease. Interventional radiology attempt to remove the mural thrombus would only expose the patient to higher risk of stroke in my opinion.  I would recommend adding 81 mg aspirin per day to her Coumadin for chronic atrial fibrillation. She has not been using aspirin. I would hesitate add Plavix in a patient 83 years old with her multiple medical problems and frailty.  Objective: Vital signs in last 24 hours: Temp:  [97.4 F (36.3 C)-97.6 F (36.4 C)] 97.4 F (36.3 C) (02/02 1400) Pulse Rate:  [75-114] 84 (02/02 1815) Cardiac Rhythm: Atrial fibrillation (02/02 0800) Resp:  [20-24] 20 (02/02 1815) BP: (93-109)/(50-68) 109/64 (02/02 1815) SpO2:  [79 %-98 %] 98 % (02/02 1815) Weight:  [169 lb 4.8 oz (76.8 kg)] 169 lb 4.8 oz (76.8 kg) (02/02 0615)  Hemodynamic parameters for last 24 hours:    Intake/Output from previous day: 02/01 0701 - 02/02 0700 In: 480 [P.O.:480] Out: 1325 [Urine:1325] Intake/Output this shift: Total I/O In: 600 [P.O.:600] Out: 550 [Urine:550]      Physical Exam  General: Frail 83 year old Caucasian female on oxygen 4 L HEENT:  Normocephalic pupils equal , dentition adequate Neck: Supple, 2+ JVD, no palpable adenopathy, or bruit Chest: Clear to auscultation, symmetrical breath sounds, no rhonchi, no tenderness             or deformity Cardiovascular: Atrial fibrillation with 3/6 holosystolic murmur of TR , peripheral pulses palpable but significant pedal edema bilaterally from right-sided failure  Abdomen:  Soft, nontender, no palpable mass or organomegaly Extremities: Warm, well-perfused, no clubbing cyanosis  or tenderness,              no venous stasis changes of the legs Rectal/GU: Deferred Neuro: Grossly non--focal and symmetrical throughout Skin: Clean and dry without rash or ulceration   Lab Results:  Recent Labs  10/21/16 0204 10/21/16 0237  WBC 6.8  --   HGB 12.6 13.3  HCT 40.3 39.0  PLT 187  --    BMET:  Recent Labs  10/21/16 0204 10/21/16 0237 10/23/16 0558  NA 140 141 138  K 4.8 4.8 4.3  CL 103 105 102  CO2 27  --  30  GLUCOSE 89 90 89  BUN 31* 34* 25*  CREATININE 1.16* 1.20* 1.05*  CALCIUM 9.5  --  8.7*    PT/INR:  Recent Labs  10/23/16 0558  LABPROT 27.9*  INR 2.55   ABG    Component Value Date/Time   TCO2 32 10/21/2016 0237   CBG (last 3)   Recent Labs  10/21/16 0200  GLUCAP 78    Assessment/Plan: S/P  Surgery not recommended for the mural thrombus of her ascending  aorta and recent probable embolic stroke Recommend adding 81 mg aspirin to her usual dose of warfarin for the arterial mural thrombus.   LOS: 1 day    Tharon Aquas Trigt III 10/23/2016

## 2016-10-23 NOTE — Progress Notes (Signed)
Occupational Therapy Treatment Patient Details Name: Erica Bridges MRN: [id number] DOB: Dec 09, 1926 Today's Date: 10/23/2016    History of present illness 83 y.o.femalewith medical history significant of HTN, HLD, afib, on chronic anticoagulation, presumed COPD; who presents with complaints of left hand weakness. MRI on 1/31 negative for acute findings.   OT comments  Pt making progress toward OT goals but continues to be limited by balance deficits and O2 sats. Pt able to complete toilet transfer, toileting, and LB dressing with min assist overall. SpO2 in 80s during functional activities on 6L O2; returned to mid 90s with seated rest and 5L supplemental O2. Pt lives alone and family unable to provide 24/7 supervision. In addition, pt continues to require physical assist for functional activities/mobility, therefore, updated d/c plan to SNF for follow up to maximize independence and safety with ADL and functional mobility prior to return home. Will continue to follow acutely.    Follow Up Recommendations  SNF;Supervision/Assistance - 24 hour    Equipment Recommendations  None recommended by OT    Recommendations for Other Services      Precautions / Restrictions Precautions Precautions: Fall Precaution Comments: watch O2 sats Restrictions Weight Bearing Restrictions: No       Mobility Bed Mobility Overal bed mobility: Needs Assistance Bed Mobility: Supine to Sit;Sit to Supine     Supine to sit: Supervision Sit to supine: Supervision   General bed mobility comments: Supervision for safety.  Transfers Overall transfer level: Needs assistance Equipment used: Rolling walker (2 wheeled) Transfers: Sit to/from Stand Sit to Stand: Min guard         General transfer comment: Min gurad for safety; requires min assist for standing balance during functional activities.    Balance Overall balance assessment: Needs assistance Sitting-balance support: Feet supported;No upper  extremity supported Sitting balance-Leahy Scale: Good     Standing balance support: No upper extremity supported;During functional activity Standing balance-Leahy Scale: Poor Standing balance comment: Min assist for standing balance                   ADL Overall ADL's : Needs assistance/impaired     Grooming: Min guard;Standing;Wash/dry hands               Lower Body Dressing: Minimal assistance;Sit to/from stand Lower Body Dressing Details (indicate cue type and reason): to doff/don socks and shoes Toilet Transfer: Minimal assistance;Ambulation;BSC;RW   Toileting- Clothing Manipulation and Hygiene: Minimal assistance;Sit to/from stand Toileting - Clothing Manipulation Details (indicate cue type and reason): assist for standing balance during peri care     Functional mobility during ADLs: Minimal assistance;Rolling walker General ADL Comments: SpO2 down to 80% on 6L O2 with activity; returned to mid 90s on 5L with rest.      Vision                     Perception     Praxis      Cognition   Behavior During Therapy: [facility] for tasks assessed/performed Overall Cognitive Status: Within Functional Limits for tasks assessed                       Extremity/Trunk Assessment               Exercises     Shoulder Instructions       General Comments      Pertinent Vitals/ Pain       Pain Assessment: No/denies pain  Home Living  Prior Functioning/Environment              Frequency  Min 2X/week        Progress Toward Goals  OT Goals(current goals can now be found in the care plan section)  Progress towards OT goals: Progressing toward goals  Acute Rehab OT Goals Patient Stated Goal: rehab then home OT Goal Formulation: With patient/family  Plan Discharge plan needs to be updated    Co-evaluation                 End of Session Equipment Utilized During  Treatment: Gait belt;Rolling walker;Oxygen   Activity Tolerance Patient tolerated treatment well   Patient Left in bed;with call bell/phone within reach;with bed alarm set;with family/visitor present   Nurse Communication          Time: [id number] OT Time Calculation (min): 23 min  Charges: OT General Charges $OT Visit: 1 Procedure OT Treatments $Self Care/Home Management : 23-37 mins  Binnie Kand M.S., OTR/L Pager: [phone]  10/23/2016, 4:32 PM

## 2016-10-23 NOTE — Consult Note (Signed)
Cardiology Consult    Patient ID: Erica Bridges MRN: [id number], DOB/AGE: 81-Jan-1928   Admit date: 10/21/2016 Date of Consult: 10/23/2016  Primary Physician: Ria Bush, MD Primary Cardiologist: New Requesting Provider: Dr. Grandville Silos Reason for Consultation: AF, CHF  Patient Profile    83 yo female with PMH of permanent AF, HTN, HL, Uterine Ca, and asthma who presented with new onset weakness/numbness in the left hand and found to have CHF.   Past Medical History   Past Medical History:  Diagnosis Date  . Asthma   . Atrial fibrillation ([place])   . Benign neoplasm of colon 10/22/2005   Hyperplastic  & Adenomatous polyps   . Cardiomegaly 2013   by xray  . Diverticulosis of colon (without mention of hemorrhage)   . External hemorrhoids without mention of complication   . HLD (hyperlipidemia)   . HTN (hypertension)   . Malignant neoplasm of corpus uteri, except isthmus ([place])    followed by [name] with yearly visits  . Onychia and paronychia of toe   . Osteopenia 2017   by xray, but DEXA WNL 2014  . Pneumonia   . Polycythemia 11/2011   normal EPO and periph smear    Past Surgical History:  Procedure Laterality Date  . CATARACT EXTRACTION Bilateral   . COLONOSCOPY  10/22/05   polyps-bx negative; divertics, ext hemmorhoids  . COLONOSCOPY  03/2013   tubular adenomas, small angiodysplastic lesion at cecum, mod diverticulosis [facility]le[facility] Purl)  . DEXA  2014   WNL  . GYNECOLOGIC CRYOSURGERY    . LUMBAR DISC SURGERY  1970s  . REFRACTIVE SURGERY Bilateral   . TONSILLECTOMY    . TOTAL ABDOMINAL HYSTERECTOMY W/ BILATERAL SALPINGOOPHORECTOMY  2005   uterine adenocarcinoma     Allergies  Allergies  Allergen Reactions  . Amoxicillin     REACTION: rashj  . Penicillins     Has patient had a PCN reaction causing immediate rash, facial/tongue/throat swelling, SOB or lightheadedness with hypotension: no Has patient had a PCN reaction causing severe rash involving mucus membranes or  skin necrosis: no Has patient had a PCN reaction that required hospitalization no Has patient had a PCN reaction occurring within the last 10 years: no If all of the above answers are "NO", then may proceed with Cephalosporin use.     History of Present Illness    Erica Bridges is a 83 yo female with PMH of permanent AF, HTN, HL, Uterine Ca, and asthma. Reports she has never seen a cardiologist, but did have a stress test over 10 years ago when she was undergoing treatment for her uterine Ca. Has had AF for many years and is followed by her PCP for this. Reports she is fairly active and cares for herself at home. Uses a walker at home, does not normal have any anginal symptoms, but has noticed some progressive dyspnea.   Reports she was watching TV on 10/20/16 and attempted to use the remote to turn off the TV. States she could not get her left hand to "work right" and it felt weak/numb and she also had slurred speech.   CT head showed no acute abnormalities, neuro was consulted but noted she was not a TPA candidate.  Carotid US showed b/l 40-59% stenosis, with followed CTA neck showing ascending aortic 7x7x57mm mural thrombus, severe stenosis to left subclavian.   MRI showed no acute abnormality with chronic small vessel disease, and 3-79mm right paraophthalmic ICA aneurysm.   Echo showed normal EF but high  filling ventricular pressure, biatrial enlargement, mild MR, severe TR and PA pressure 62.  She was placed on IV lasix 40mg  daily, but blood pressure has remained borderline hypotensive during admission with little diuresis. Renal function stable.   Inpatient Medications    .  stroke: mapping our early stages of recovery book   Does not apply Once  . atenolol  50 mg Oral Daily  . [START ON 10/24/2016] furosemide  40 mg Intravenous Daily  . guaiFENesin  600 mg Oral BID  . potassium chloride  10 mEq Oral Daily  . pravastatin  40 mg Oral q1800    Family History    Family History  Problem  Relation Age of Onset  . Hypertension Father   . Stroke Father   . Diabetes Mother   . Coronary artery disease Mother   . Hypertension Mother   . Colon cancer Neg Hx     Social History    Social History   Social History  . Marital status: Widowed    Spouse name: N/A  . Number of children: 3  . Years of education: N/A   Occupational History  . Retired Radiation protection practitioner    Social History Main Topics  . Smoking status: Former Smoker    Types: Cigarettes    Quit date: 09/21/1982  . Smokeless tobacco: Never Used  . Alcohol use No  . Drug use: No  . Sexual activity: Not on file   Other Topics Concern  . Not on file   Social History Narrative   Retired, widow   One son Janeece Riggers - part time post office), one daughter ([place] Engineer, mining), other daughter Janeece Riggers)   Daily caffeine 1+     Review of Systems    General:  No chills, fever, night sweats or weight changes.  Cardiovascular: See HPI Dermatological: No rash, lesions/masses Respiratory: No cough, ++ dyspnea Urologic: No hematuria, dysuria Abdominal:   No nausea, vomiting, diarrhea, bright red blood per rectum, melena, or hematemesis Neurologic:  See HPI. All other systems reviewed and are otherwise negative except as noted above.  Physical Exam    Blood pressure 100/61, pulse (!) 114, temperature 97.6 F (36.4 C), temperature source Oral, resp. rate (!) 22, height 5\' 9"  (1.753 m), weight 169 lb 4.8 oz (76.8 kg), SpO2 (!) 79 %.  General: Pleasant older WF, NAD, wearing [place] Psych: Normal affect. Neuro: Alert and oriented X 3. Moves all extremities spontaneously. HEENT: Normal  Neck: Supple without bruits, + JVD. Lungs:  Resp regular and unlabored, Wheezing. Heart: Irreg Irreg no s3, s4, 3/6 systolic murmur. Abdomen: Soft, non-tender, non-distended, BS + x 4.  Extremities: No clubbing, cyanosis, bilateral 3+ pitting edema up to knees. DP/PT/Radials 2+ and equal bilaterally.  Labs    Troponin  [facility] of Care Test)  Recent Labs  10/21/16 0235  TROPIPOC 0.02   No results for input(s): CKTOTAL, CKMB, TROPONINI in the last 72 hours. Lab Results  Component Value Date   WBC 6.8 10/21/2016   HGB 13.3 10/21/2016   HCT 39.0 10/21/2016   MCV 93.3 10/21/2016   PLT 187 10/21/2016    Recent Labs Lab 10/21/16 0204  10/23/16 0558  NA 140  < > 138  K 4.8  < > 4.3  CL 103  < > 102  CO2 27  --  30  BUN 31*  < > 25*  CREATININE 1.16*  < > 1.05*  CALCIUM 9.5  --  8.7*  PROT 6.4*  --   --  BILITOT 0.7  --   --   ALKPHOS 76  --   --   ALT 16  --   --   AST 25  --   --   GLUCOSE 89  < > 89  < > = values in this interval not displayed. Lab Results  Component Value Date   CHOL 149 10/21/2016   HDL 78 10/21/2016   LDLCALC 59 10/21/2016   TRIG 62 10/21/2016   No results found for: [facility]   Radiology Studies    Ct Angio Neck W Or Wo Contrast  Result Date: 10/23/2016 CLINICAL DATA:  Transient ischemic attack. History of atrial fibrillation, hypertension, hyperlipidemia. EXAM: CT ANGIOGRAPHY NECK TECHNIQUE: Multidetector CT imaging of the neck was performed using the standard protocol during bolus administration of intravenous contrast. Multiplanar CT image reconstructions and MIPs were obtained to evaluate the vascular anatomy. Carotid stenosis measurements (when applicable) are obtained utilizing NASCET criteria, using the distal internal carotid diameter as the denominator. CONTRAST:  50 cc Isovue 370 COMPARISON:  CT and MRI head October 21, 2016, MRA head October 21, 2016 FINDINGS: AORTIC ARCH: 7 x 7 mm posterior ascending thoracic aorta thrombus extends 2 cm into the central lumen. Moderate calcific atherosclerosis of the aortic arch. Severe stenosis LEFT subclavian artery origin. Common origin of the innominate and LEFT Common carotid artery. RIGHT CAROTID SYSTEM: Common carotid artery is widely patent, coursing in a straight line fashion. Severe eccentric calcific atherosclerosis  results in less than 50% stenosis. Mild luminal irregularity attributable to atherosclerosis cervical internal carotid artery. LEFT CAROTID SYSTEM: Common carotid artery is widely patent, coursing in a straight line fashion. Mild eccentric calcific atherosclerosis. Normal appearance of the carotid bifurcation without hemodynamically significant stenosis by NASCET criteria. Patent internal carotid artery. VERTEBRAL ARTERIES:Severe stenosis LEFT vertebral artery origin due to the calcific atherosclerosis. Mild stenosis RIGHT vertebral artery origin. Bilateral vertebral arteries are patent. Extrinsic deformity due to degenerative cervical spine. SKELETON: No acute osseous process though bone windows have not been submitted. Cervicothoracic levoscoliosis and multi level severe degenerative changes cervical spine. Moderate canal stenosis C6-7. Severe C3-4 thru C6-7 neural foraminal narrowing. OTHER NECK: Soft tissues of the neck are nonacute though, not tailored for evaluation. Mild centrilobular emphysema in the included lung apices. Main pulmonary artery is 3.4 cm in transaxial dimension, associated with chronic pulmonary arterial hypertension . IMPRESSION: 1. Ascending aortic 7 x 7 x 20 mm mural thrombus with potential for embolism. 2. Severe stenosis LEFT subclavian and LEFT vertebral artery origins. Mild stenosis RIGHT vertebral artery origin. 3. Severe atherosclerosis without hemodynamically significant stenosis of the internal carotid artery's. 4. Moderate canal stenosis C6-7. Severe C3-4 thru C6-7 neural foraminal narrowing. These results will be called to the ordering clinician or representative by the Radiologist Assistant, and communication documented in the [employer] Dashboard Electronically Signed   By: Elon Alas M.D.   On: 10/23/2016 01:51   Mr Jodene Nam Head Wo Contrast  Result Date: 10/21/2016 CLINICAL DATA:  Left hand weakness. EXAM: MRI HEAD WITHOUT CONTRAST MRA HEAD WITHOUT CONTRAST TECHNIQUE:  Multiplanar, multiecho pulse sequences of the brain and surrounding structures were obtained without intravenous contrast. Angiographic images of the head were obtained using MRA technique without contrast. COMPARISON:  Head CT 10/21/2016 FINDINGS: MRI HEAD FINDINGS Brain: A partially empty sella is incidentally noted. There is no evidence of acute infarct, intracranial hemorrhage, mass, midline shift, or extra-axial fluid collection. Mild generalized cerebral atrophy is within normal limits for age. Small foci of scattered cerebral  white matter T2 hyperintensity are nonspecific but compatible with mild chronic small vessel ischemic disease. There is a chronic lacunar infarct in the body of the right caudate nucleus. Vascular: Major intracranial vascular flow voids are preserved. Skull and upper cervical spine: Unremarkable bone marrow signal. Sinuses/Orbits: Prior bilateral cataract extraction. Paranasal sinuses and mastoid air cells are clear. Other: None. MRA HEAD FINDINGS The visualized distal vertebral arteries are widely patent and codominant. Cerebellar arteries are grossly patent. Right AICA is dominant. Basilar artery is widely patent. There are patent posterior communicating arteries bilaterally, with a fetal origin of the left PCA noted. No PCA stenosis is seen. The internal carotid arteries are widely patent from skullbase to carotid termini. 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 There is a slightly bulbous appearance of the left ICA at the same level, however a sizable well-defined aneurysm is not identified and the appearance may be due to the vessel's orientation. ACAs and MCAs are patent without evidence of major branch occlusion or significant stenosis. IMPRESSION: 1. No acute intracranial abnormality. 2. Mild chronic small vessel ischemic disease. 3. No major intracranial vessel occlusion or significant stenosis. 4. 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  Electronically Signed   By: Logan Bores M.D.   On: 10/21/2016 13:05   Mr Brain Wo Contrast  Result Date: 10/21/2016 CLINICAL DATA:  Left hand weakness. EXAM: MRI HEAD WITHOUT CONTRAST MRA HEAD WITHOUT CONTRAST TECHNIQUE: Multiplanar, multiecho pulse sequences of the brain and surrounding structures were obtained without intravenous contrast. Angiographic images of the head were obtained using MRA technique without contrast. COMPARISON:  Head CT 10/21/2016 FINDINGS: MRI HEAD FINDINGS Brain: A partially empty sella is incidentally noted. There is no evidence of acute infarct, intracranial hemorrhage, mass, midline shift, or extra-axial fluid collection. Mild generalized cerebral atrophy is within normal limits for age. Small foci of scattered cerebral white matter T2 hyperintensity are nonspecific but compatible with mild chronic small vessel ischemic disease. There is a chronic lacunar infarct in the body of the right caudate nucleus. Vascular: Major intracranial vascular flow voids are preserved. Skull and upper cervical spine: Unremarkable bone marrow signal. Sinuses/Orbits: Prior bilateral cataract extraction. Paranasal sinuses and mastoid air cells are clear. Other: None. MRA HEAD FINDINGS The visualized distal vertebral arteries are widely patent and codominant. Cerebellar arteries are grossly patent. Right AICA is dominant. Basilar artery is widely patent. There are patent posterior communicating arteries bilaterally, with a fetal origin of the left PCA noted. No PCA stenosis is seen. The internal carotid arteries are widely patent from skullbase to carotid termini. 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 There is a slightly bulbous appearance of the left ICA at the same level, however a sizable well-defined aneurysm is not identified and the appearance may be due to the vessel's orientation. ACAs and MCAs are patent without evidence of major branch occlusion or significant  stenosis. IMPRESSION: 1. No acute intracranial abnormality. 2. Mild chronic small vessel ischemic disease. 3. No major intracranial vessel occlusion or significant stenosis. 4. 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 Electronically Signed   By: Logan Bores M.D.   On: 10/21/2016 13:05   Dg Chest Port 1 View  Result Date: 10/23/2016 CLINICAL DATA:  Congestive failure EXAM: PORTABLE CHEST 1 VIEW COMPARISON:  10/22/2016 FINDINGS: Cardiac shadow is enlarged. Mild vascular congestion is again identified and stable. No new focal infiltrate or sizable effusion is seen. No bony abnormality is noted. IMPRESSION: Mild CHF stable  from the prior exam. Electronically Signed   By: Inez Catalina M.D.   On: 10/23/2016 07:36   Dg Chest Port 1 View  Result Date: 10/22/2016 CLINICAL DATA:  Acute respiratory distress. EXAM: PORTABLE CHEST 1 VIEW COMPARISON:  Radiographs yesterday. FINDINGS: Cardiomegaly, with question progression versus differences in technique. Increased peribronchial and interstitial thickening from prior. Probable left pleural effusion. No confluent airspace disease. No pneumothorax. IMPRESSION: Cardiomegaly, possible progression. Increased peribronchial and interstitial thickening may be pulmonary edema or infectious/ inflammatory. Suspect small left pleural effusion. Electronically Signed   By: Jeb Levering M.D.   On: 10/22/2016 06:01   Dg Abd Acute W/chest  Result Date: 10/21/2016 CLINICAL DATA:  Shortness of breath.  Diarrhea. EXAM: DG ABDOMEN ACUTE W/ 1V CHEST COMPARISON:  Chest radiograph 08/07/2016.  Abdomen CT 02/08/2008 FINDINGS: Cardiomegaly is unchanged. Atherosclerosis of the thoracic aorta. Mild hyperinflation and emphysema, stable from prior. No consolidation, pleural fluid or pneumothorax. No pulmonary edema. No free intra- abdominal air. No dilated bowel loops to suggest obstruction. Small to moderate colonic stool burden. There are pelvic phleboliths. Midline upper abdominal  calcifications felt be vascular. No definite radiopaque calculi. No acute osseous abnormalities are seen. IMPRESSION: 1. Stable cardiomegaly and thoracic aortic atherosclerosis. Stable hyperinflation and emphysema. No acute chest finding. 2. Normal bowel gas pattern.  No bowel obstruction or free air. Electronically Signed   By: Jeb Levering M.D.   On: 10/21/2016 05:02   Ct Head Code Stroke W/o Cm  Addendum Date: 10/21/2016   ADDENDUM REPORT: 10/21/2016 02:23 ADDENDUM: Critical Value/emergent results were called by telephone at the time of interpretation on 10/21/2016 at 2:23 am to Dr. Cheral Marker, Neurology , who verbally acknowledged these results. Electronically Signed   By: Elon Alas M.D.   On: 10/21/2016 02:23   Result Date: 10/21/2016 CLINICAL DATA:  Code stroke. LEFT-sided weakness. History of hypertension, hyperlipidemia. EXAM: CT HEAD WITHOUT CONTRAST TECHNIQUE: Contiguous axial images were obtained from the base of the skull through the vertex without intravenous contrast. COMPARISON:  None. FINDINGS: BRAIN: The ventricles and sulci are normal for age. No intraparenchymal hemorrhage, mass effect nor midline shift. Patchy supratentorial white matter hypodensities less than expected for patient's age, though non-specific are most compatible with chronic small vessel ischemic disease. No acute large vascular territory infarcts. No abnormal extra-axial fluid collections. Basal cisterns are patent. RIGHT parietal punctate calcification is likely extra-axial. VASCULAR: Mild calcific atherosclerosis of the carotid siphons. SKULL: No skull fracture. No significant scalp soft tissue swelling. SINUSES/ORBITS: The mastoid air-cells and included paranasal sinuses are well-aerated.The included ocular globes and orbital contents are non-suspicious. OTHER: Calcified pannus about the odontoid process compatible with CPPD. ASPECTS [facility] Stroke Program Early CT Score) - Ganglionic level infarction (caudate,  lentiform nuclei, internal capsule, insula, M1-M3 cortex): 7 - Supraganglionic infarction (M4-M6 cortex): 3 Total score (0-10 with 10 being normal): 10 IMPRESSION: 1. Negative CT HEAD for age. 2. ASPECTS is 10. Dr. Cheral Marker, neurology paged on October 21, 2016 at 0218 hours, awaiting return call. Electronically Signed: By: Elon Alas M.D. On: 10/21/2016 02:20    ECG & Cardiac Imaging    EKG: AF rate controlled  Echo: 10/22/16  Study Conclusions  - Left ventricle: The cavity size was normal. Wall thickness was   normal. Systolic function was normal. The estimated ejection   fraction was in the range of 55% to 60%. Wall motion was normal;   there were no regional wall motion abnormalities. Doppler   parameters are consistent with high  ventricular filling pressure. - Ventricular septum: The contour showed diastolic flattening and   systolic flattening. - Aortic valve: Valve mobility was restricted. - Mitral valve: Calcified annulus. There was mild regurgitation.   Valve area by continuity equation (using LVOT flow): 1.14 cm^2. - Left atrium: The atrium was severely dilated. - Right ventricle: The cavity size was moderately dilated. Systolic   function was moderately reduced. - Right atrium: The atrium was severely dilated. - Atrial septum: There was an atrial septal aneurysm. - Tricuspid valve: There was severe regurgitation. - Pulmonary arteries: Systolic pressure was moderately to severely   increased. PA peak pressure: 62 mm Hg (S). - Pericardium, extracardiac: A trivial pericardial effusion was   identified.  Impressions:  - Normal LV systolic function; elevated LV filling pressure; D   shaped septum; biatrial enlargement; moderate RVE with moderately   reduced RV function; mild MR; severe TR with moderate to severe   elevation in pulmonary pressure.  Assessment & Plan    83 yo female with PMH of permanent AF, HTN, HL, Uterine Ca, and asthma who presented with new  onset weakness/numbness in the left hand and found to have CHF.  1. Acute Diastolic HF: reports increasing exertional dyspnea over the past couple of weeks. Echo this admission shows elevated filling pressures, biatrial enlargement, mild MR, severe TR and PA pressure 62. Attempts made at IV diuresis but her blood pressures have been low, and not had much UOP. She is very volume overloaded with 3+ pitting edema up to her knees, wheezing noted. Needs further diuresis, but close blood pressure monitoring. -- Change to 40mg  IV BID   2. Permanent AF: reports she was dx over 10 years ago and is followed by her primary MD for coumadin dosing. Never had a DCCV. Seems it would be difficult to maintain SR as she has biatrial enlargement on her echo today. This may be contributing to her volume overload.  -- on atenolol for rate control and coumadin for AC. INR has been therapeutic this admission.  3. Weakness/numbness and slurred speech: MRI was negative but  CTA showed severe stenosis to left subclavian with ascending aortic 7x7x57mm mural thrombus, though she appears to have been therapeutic on Coumadin. -- Consider CTA chest to further assess this new finding in the aorta. CVTS consult? -- neurology following  4. HL: Lipid panel with good control. On statin therapy  5. Severe TR/Elevated PA pressure: noted on echo this admission, likely contributing to HF symptoms this admission.   6. Asthma: Reports hx of smoking, never officially been diagnosed with COPD but has recently been on daily inhaled corticosteroids in the past.   Signed, Reino Bellis, NP-C Pager [phone] 10/23/2016, 1:55 PM

## 2016-10-23 NOTE — Progress Notes (Signed)
PROGRESS NOTE    Erica Bridges  [id number] DOB: 01-26-1927 DOA: 10/21/2016 PCP: Ria Bush, MD    Brief Narrative:  83 yo female with htn, dyslipidemia and atrial fibrillation on anticoagulation. Presented with new onset left hand numbness and slurred speech. By the initial physical examination she had recovered her function. Admitted for further workup.  2/1 am with resp distress, CXR with CHF   Assessment & Plan:   Principal Problem:   TIA (transient ischemic attack) Active Problems:   HYPERCHOLESTEROLEMIA   Essential hypertension   Atrial fibrillation ([place])   Medicare annual wellness visit, subsequent   Chronic anticoagulation   CHF (congestive heart failure) (HCC)   SOB (shortness of breath)   Acute respiratory distress   Thrombus of aorta (HCC)  #1 TIA Patient had presented with left hand numbness and slurred speech with rapid improvement with symptoms and a such TPA was not initiated. Patient was placed on telemetry and underwent a stroke workup. Patient also noted to have a history of chronic atrial fibrillation. Head CT which was obtained was negative. MRI of the head obtained was negative. MRA of the head showed a 3-4 mm right paraophthalmic ICA aneurysm. Carotid Doppler showed bilateral ICA 40-59% stenosis. 2 DL coordinate had a EF of 55-60% with no source of emboli. Hemoglobin A1c was 5.7. LDL of 59. Patient noted to have a therapeutic INR. CT angiogram neck was concerning for a sending aortic 7 x 7 x 20 mm mural thrombus with potential for embolism, severe stenosis left subclavian and left vertebral artery origins. Mild stenosis right vertebral artery origin. PT/OT. Neurology recommended patient be changed from Coumadin to eliquis for secondary stroke prevention. INR currently at 2.5. Coumadin to be held until INR is around 2 and then Elliquis may be started. Patient has also been seen in consultation by CT surgery secondary to this descending aortic thrombus and CT  surgery recommended addition of baby aspirin to Coumadin for her chronic atrial fibrillation. Will need to discuss with neurology for final decision is secondary stroke prevention.  #2 acute on chronic diastolic heart failure Patient noted to be significantly short of breath and noted to be an acute CHF exacerbation. Patient is volume loaded overloaded on examination. Patient currently on Lasix 40 mg IV daily. Due to patient's borderline blood pressure as well as recent TIA will consult with cardiology for further evaluation and management. Follow.  #3 chronic atrial fibrillation CHA2DS2VASC score >3 Patient currently on atenolol however dose of be decreased in order to be able to diureses patient adequately. Monitor heart rate. INR currently therapeutic. Patient was on Coumadin prior to admission.  #4 hypertension Blood pressure borderline. Atenolol dose to be decreased in half per cardiology recommendations.  #5 dyslipidemia LDL of 59. Continue statin.  #6 chronic kidney disease stage III Stable.  #7 ascending aortic thrombus Per CT angiogram of the neck. Patient has been seen in consultation by cardiothoracic surgery who feel that patient is not a candidate for open surgical repair.. Interventional radiology attempt to remove mural thrombus would only expose the patient to high results of stroke per CT surgery. CT surgery recommending adding 81 mg aspirin per day to patient's Coumadin for chronic A. fib as patient has not been on aspirin before. Aspirin has been started. Appreciate CT surgery's input and recommendations.      DVT prophylaxis: On Coumadin Code Status: Full Family Communication: Updated patient and family at bedside. Disposition Plan: Home once volume overload has resolved and decision on  secondary stroke prophylaxis has been made per neurology and when patient is medically stable.   Consultants:   Neurology [place] 10/21/2016  Cardiology Dr. Debara Pickett  10/23/2016  CT surgery Dr. Prescott Gum 10/23/2016  Procedures:   CT angiogram neck 10/23/2016  Chest x-ray 10/22/2016, 10/23/2016  MRI head MRA head 10/21/2016  2-D echo 10/22/2016  Carotid Dopplers 10/21/2016  CT head without contrast 10/21/2016  Antimicrobials:   None   Subjective: Patient states left upper extremity weakness and numbness improving. Patient denies any chest pain. Patient states shortness of breath has improved since last night.  Objective: Vitals:   10/23/16 1011 10/23/16 1122 10/23/16 1400 10/23/16 1815  BP: 108/68 100/61 (!) 100/50 109/64  Pulse: 95 (!) 114 75 84  Resp:  (!) 22 (!) 22 20  Temp:   97.4 F (36.3 C)   TempSrc:   Oral   SpO2:  (!) 79% 96% 98%  Weight:      Height:        Intake/Output Summary (Last 24 hours) at 10/23/16 1915 Last data filed at 10/23/16 1700  Gross per 24 hour  Intake              840 ml  Output             1375 ml  Net             -535 ml   Filed Weights   10/21/16 1654 10/22/16 0405 10/23/16 0615  Weight: 76.2 kg (168 lb 1.6 oz) 76.5 kg (168 lb 10.4 oz) 76.8 kg (169 lb 4.8 oz)    Examination:  General exam: Appears calm and comfortable  Respiratory system: Some bibasilar crackles. No wheezing. Respiratory effort normal. Cardiovascular system: Irregularly irregular. Positive JVD. 3/6 SEM. 2-3 + BLE to hips. Gastrointestinal system: Abdomen is nondistended, soft and nontender. No organomegaly or masses felt. Normal bowel sounds heard. Central nervous system: Alert and oriented. No focal neurological deficits. Extremities: Symmetric 5 x 5 power. Skin: No rashes, lesions or ulcers Psychiatry: Judgement and insight appear normal. Mood & affect appropriate.     Data Reviewed: I have personally reviewed following labs and imaging studies  CBC:  Recent Labs Lab 10/21/16 0204 10/21/16 0237  WBC 6.8  --   NEUTROABS 4.6  --   HGB 12.6 13.3  HCT 40.3 39.0  MCV 93.3  --   PLT 187  --    Basic  Metabolic Panel:  Recent Labs Lab 10/21/16 0204 10/21/16 0237 10/23/16 0558  NA 140 141 138  K 4.8 4.8 4.3  CL 103 105 102  CO2 27  --  30  GLUCOSE 89 90 89  BUN 31* 34* 25*  CREATININE 1.16* 1.20* 1.05*  CALCIUM 9.5  --  8.7*   GFR: Estimated Creatinine Clearance: 39.4 mL/min (by C-G formula based on SCr of 1.05 mg/dL (H)). Liver Function Tests:  Recent Labs Lab 10/21/16 0204  AST 25  ALT 16  ALKPHOS 76  BILITOT 0.7  PROT 6.4*  ALBUMIN 3.3*   No results for input(s): LIPASE, AMYLASE in the last 168 hours. No results for input(s): AMMONIA in the last 168 hours. Coagulation Profile:  Recent Labs Lab 10/21/16 0204 10/22/16 0328 10/23/16 0558  INR 2.15 2.50 2.55   Cardiac Enzymes: No results for input(s): CKTOTAL, CKMB, CKMBINDEX, TROPONINI in the last 168 hours. BNP (last 3 results) No results for input(s): PROBNP in the last 8760 hours. HbA1C:  Recent Labs  10/21/16 0407  HGBA1C 5.7*  CBG:  Recent Labs Lab 10/21/16 0200  GLUCAP 78   Lipid Profile:  Recent Labs  10/21/16 0407  CHOL 149  HDL 78  LDLCALC 59  TRIG 62  CHOLHDL 1.9   Thyroid Function Tests: No results for input(s): TSH, T4TOTAL, FREET4, T3FREE, THYROIDAB in the last 72 hours. Anemia Panel: No results for input(s): VITAMINB12, FOLATE, FERRITIN, TIBC, IRON, RETICCTPCT in the last 72 hours. Sepsis Labs: No results for input(s): PROCALCITON, LATICACIDVEN in the last 168 hours.  No results found for this or any previous visit (from the past 240 hour(s)).       Radiology Studies: Ct Angio Neck W Or Wo Contrast  Result Date: 10/23/2016 CLINICAL DATA:  Transient ischemic attack. History of atrial fibrillation, hypertension, hyperlipidemia. EXAM: CT ANGIOGRAPHY NECK TECHNIQUE: Multidetector CT imaging of the neck was performed using the standard protocol during bolus administration of intravenous contrast. Multiplanar CT image reconstructions and MIPs were obtained to evaluate the  vascular anatomy. Carotid stenosis measurements (when applicable) are obtained utilizing NASCET criteria, using the distal internal carotid diameter as the denominator. CONTRAST:  50 cc Isovue 370 COMPARISON:  CT and MRI head October 21, 2016, MRA head October 21, 2016 FINDINGS: AORTIC ARCH: 7 x 7 mm posterior ascending thoracic aorta thrombus extends 2 cm into the central lumen. Moderate calcific atherosclerosis of the aortic arch. Severe stenosis LEFT subclavian artery origin. Common origin of the innominate and LEFT Common carotid artery. RIGHT CAROTID SYSTEM: Common carotid artery is widely patent, coursing in a straight line fashion. Severe eccentric calcific atherosclerosis results in less than 50% stenosis. Mild luminal irregularity attributable to atherosclerosis cervical internal carotid artery. LEFT CAROTID SYSTEM: Common carotid artery is widely patent, coursing in a straight line fashion. Mild eccentric calcific atherosclerosis. Normal appearance of the carotid bifurcation without hemodynamically significant stenosis by NASCET criteria. Patent internal carotid artery. VERTEBRAL ARTERIES:Severe stenosis LEFT vertebral artery origin due to the calcific atherosclerosis. Mild stenosis RIGHT vertebral artery origin. Bilateral vertebral arteries are patent. Extrinsic deformity due to degenerative cervical spine. SKELETON: No acute osseous process though bone windows have not been submitted. Cervicothoracic levoscoliosis and multi level severe degenerative changes cervical spine. Moderate canal stenosis C6-7. Severe C3-4 thru C6-7 neural foraminal narrowing. OTHER NECK: Soft tissues of the neck are nonacute though, not tailored for evaluation. Mild centrilobular emphysema in the included lung apices. Main pulmonary artery is 3.4 cm in transaxial dimension, associated with chronic pulmonary arterial hypertension . IMPRESSION: 1. Ascending aortic 7 x 7 x 20 mm mural thrombus with potential for embolism. 2. Severe  stenosis LEFT subclavian and LEFT vertebral artery origins. Mild stenosis RIGHT vertebral artery origin. 3. Severe atherosclerosis without hemodynamically significant stenosis of the internal carotid artery's. 4. Moderate canal stenosis C6-7. Severe C3-4 thru C6-7 neural foraminal narrowing. These results will be called to the ordering clinician or representative by the Radiologist Assistant, and communication documented in the [facility] Dashboard Electronically Signed   By: Elon Alas M.D.   On: 10/23/2016 01:51   Dg Chest Port 1 View  Result Date: 10/23/2016 CLINICAL DATA:  Congestive failure EXAM: PORTABLE CHEST 1 VIEW COMPARISON:  10/22/2016 FINDINGS: Cardiac shadow is enlarged. Mild vascular congestion is again identified and stable. No new focal infiltrate or sizable effusion is seen. No bony abnormality is noted. IMPRESSION: Mild CHF stable from the prior exam. Electronically Signed   By: Inez Catalina M.D.   On: 10/23/2016 07:36   Dg Chest Port 1 View  Result Date: 10/22/2016 CLINICAL DATA:  Acute respiratory distress. EXAM: PORTABLE CHEST 1 VIEW COMPARISON:  Radiographs yesterday. FINDINGS: Cardiomegaly, with question progression versus differences in technique. Increased peribronchial and interstitial thickening from prior. Probable left pleural effusion. No confluent airspace disease. No pneumothorax. IMPRESSION: Cardiomegaly, possible progression. Increased peribronchial and interstitial thickening may be pulmonary edema or infectious/ inflammatory. Suspect small left pleural effusion. Electronically Signed   By: Jeb Levering M.D.   On: 10/22/2016 06:01        Scheduled Meds: .  stroke: mapping our early stages of recovery book   Does not apply Once  . [START ON 10/24/2016] aspirin EC  81 mg Oral Daily  . [START ON 10/24/2016] atenolol  12.5 mg Oral Daily  . furosemide  40 mg Intravenous BID  . guaiFENesin  600 mg Oral BID  . potassium chloride  10 mEq Oral Daily  . pravastatin   40 mg Oral q1800   Continuous Infusions:   LOS: 1 day    Time spent: 40 minutes    Lilymarie Scroggins, MD [facility] Pager [phone]  If 7PM-7AM, please contact night-coverage [email or web address] Password [email or web address] 10/23/2016, 7:15 PM

## 2016-10-23 NOTE — Evaluation (Addendum)
Speech Language Pathology Evaluation Patient Details Name: Erica Bridges MRN: [id number] DOB: Jun 27, 1927 Today's Date: 10/23/2016 Time: [id number] SLP Time Calculation (min) (ACUTE ONLY): 17 min  Problem List:  Patient Active Problem List   Diagnosis Date Noted  . CHF (congestive heart failure) ([place])   . SOB (shortness of breath)   . TIA (transient ischemic attack) 10/21/2016  . Chronic anticoagulation 10/21/2016  . Osteopenia 03/02/2016  . Midline low back pain without sciatica 06/20/2015  . Advanced care planning/counseling discussion 01/15/2015  . Dizziness 02/07/2014  . Medicare annual wellness visit, subsequent 12/05/2011  . Polycythemia 12/05/2011  . History of colon polyps 12/05/2011  . UNSPECIFIED MENOPAUSAL&POSTMENOPAUSAL DISORDER 09/19/2008  . CANCER, ENDOMETRIUM 02/22/2007  . HYPERCHOLESTEROLEMIA 02/22/2007  . Essential hypertension 02/22/2007  . REACTIVE AIRWAY DISEASE 02/22/2007  . DIVERTICULOSIS, COLON 02/22/2007  . DERMATITIS, OTHER ATOPIC 02/22/2007  . Atrial fibrillation ([place]) 02/03/2007   Past Medical History:  Past Medical History:  Diagnosis Date  . Asthma   . Atrial fibrillation ([place])   . Benign neoplasm of colon 10/22/2005   Hyperplastic  & Adenomatous polyps   . Cardiomegaly 2013   by xray  . Diverticulosis of colon (without mention of hemorrhage)   . External hemorrhoids without mention of complication   . HLD (hyperlipidemia)   . HTN (hypertension)   . Malignant neoplasm of corpus uteri, except isthmus ([place])    followed by [name] with yearly visits  . Onychia and paronychia of toe   . Osteopenia 2017   by xray, but DEXA WNL 2014  . Pneumonia   . Polycythemia 11/2011   normal EPO and periph smear   Past Surgical History:  Past Surgical History:  Procedure Laterality Date  . CATARACT EXTRACTION Bilateral   . COLONOSCOPY  10/22/05   polyps-bx negative; divertics, ext hemmorhoids  . COLONOSCOPY  03/2013   tubular adenomas, small angiodysplastic  lesion at cecum, mod diverticulosis Carlean Purl)  . DEXA  2014   WNL  . GYNECOLOGIC CRYOSURGERY    . LUMBAR DISC SURGERY  1970s  . REFRACTIVE SURGERY Bilateral   . TONSILLECTOMY    . TOTAL ABDOMINAL HYSTERECTOMY W/ BILATERAL SALPINGOOPHORECTOMY  2005   uterine adenocarcinoma   HPI:  Erica Bridges an 83 y.o.femalewith a history of atrial fibrillation on warfarin who presented 10/21/16 with acute onset of LUE ataxia and hand clumsiness. EMS noted slurred speech as well as LUE deficits on arrival. Patient was not administered IV t-PA secondary to due to NIHSS of 1 with rapidly resolving deficits. Shewas admitted for further evaluation and treatment. MRI 1/31 No acute intracranial abnormality, mild chronic small vessel ischemic disease., MRA 1/31 showed 3-4 mm right paraophthalmic ICA aneurysm.   Assessment / Plan / Recommendation Clinical Impression  Patient presents with cognitive communication status which appears at baseline levels. Patient is alert and oriented x4, has functional short-term recall (3/4, 100% accuracy with category cue). Naming, repetition and attention WFL, pt follows 5/5 multistep commands and demonstrates adequate reasoning, safety and judgement in 4/4 functional scenarios. Patient denies changes in speech, cognition or language, states she is at baseline. No further need for skilled SLP services; SLP will s/o. Recommend 24 hr supervision/assistance, due to OT/PT recommendations for mobility.     SLP Assessment  Patient does not need any further Speech Lanaguage Pathology Services    Follow Up Recommendations  24 hour supervision/assistance;Skilled Nursing facility    Frequency and Duration           SLP Evaluation Cognition  Overall Cognitive Status: Within Functional Limits for tasks assessed Arousal/Alertness: Awake/alert Orientation Level: Oriented X4 Attention: Focused;Sustained Focused Attention: Appears intact Sustained Attention: Appears intact Memory:  Appears intact Awareness: Appears intact Problem Solving: Appears intact Executive Function: Decision Making Decision Making: Appears intact Safety/Judgment: Appears intact       Comprehension  Auditory Comprehension Overall Auditory Comprehension: Appears within functional limits for tasks assessed Visual Recognition/Discrimination Discrimination: Not tested Reading Comprehension Reading Status: Not tested    Expression Expression Primary Mode of Expression: Verbal Verbal Expression Overall Verbal Expression: Appears within functional limits for tasks assessed Written Expression Dominant Hand: Right Written Expression: Not tested   Oral / Motor  Oral Motor/Sensory Function Overall Oral Motor/Sensory Function: Within functional limits Motor Speech Overall Motor Speech: Appears within functional limits for tasks assessed   R[place]ledge, Devol CF-SLP Speech-Language Pathologist [phone]  Aliene Altes 10/23/2016, 5:07 PM

## 2016-10-23 NOTE — Progress Notes (Signed)
ANTICOAGULATION CONSULT NOTE - Initial Consult  Pharmacy Consult for warfarin to Eliquis Indication: atrial fibrillation  Allergies  Allergen Reactions  . Amoxicillin     REACTION: rashj  . Penicillins     Has patient had a PCN reaction causing immediate rash, facial/tongue/throat swelling, SOB or lightheadedness with hypotension: no Has patient had a PCN reaction causing severe rash involving mucus membranes or skin necrosis: no Has patient had a PCN reaction that required hospitalization no Has patient had a PCN reaction occurring within the last 10 years: no If all of the above answers are "NO", then may proceed with Cephalosporin use.     Vital Signs: Temp: 97.6 F (36.4 C) (02/02 0615) Temp Source: Oral (02/02 0615) BP: 100/61 (02/02 1122) Pulse Rate: 114 (02/02 1122)  Labs:  Recent Labs  10/21/16 0204 10/21/16 0237 10/22/16 0328 10/23/16 0558  HGB 12.6 13.3  --   --   HCT 40.3 39.0  --   --   PLT 187  --   --   --   APTT 33  --   --   --   LABPROT 24.3*  --  27.5* 27.9*  INR 2.15  --  2.50 2.55  CREATININE 1.16* 1.20*  --  1.05*     Medical History: Past Medical History:  Diagnosis Date  . Asthma   . Atrial fibrillation ([place])   . Benign neoplasm of colon 10/22/2005   Hyperplastic  & Adenomatous polyps   . Cardiomegaly 2013   by xray  . Diverticulosis of colon (without mention of hemorrhage)   . External hemorrhoids without mention of complication   . HLD (hyperlipidemia)   . HTN (hypertension)   . Malignant neoplasm of corpus uteri, except isthmus ([place])    followed by [name] with yearly visits  . Onychia and paronychia of toe   . Osteopenia 2017   by xray, but DEXA WNL 2014  . Pneumonia   . Polycythemia 11/2011   normal EPO and periph smear    Assessment: 83yo female presents w/ acute onset of LUE ataxia and loss of hand dexterity, deficits rapidly improved in ED, TIA alert initiated, discontinuing warfarin and changing to Eliquis for Afib. No  dose adjustment. Weight 76.8 kg, current SCr 1.05. INR this am is therapeutic at 2.55. No bleeding noted.   Goal of Therapy:  INR 2-3   Plan:  Discontinue warfarin Plan to start Eliquis 5 mg BID once INR </=2 Monitor daily INR until 2, CBC, renal function, clinical course, s/sx of bleed   Thank you for allowing [place] to participate in this patients care.  Jens Som, PharmD Clinical phone for 10/23/2016 from 7a-3:30p: x [phone] If after 3:30p, please call main pharmacy at: [phone] 10/23/2016 11:52 AM

## 2016-10-23 NOTE — Progress Notes (Signed)
STROKE TEAM PROGRESS NOTE   SUBJECTIVE (INTERVAL HISTORY) Pt daughter is at bedside. Pt overnight no acute changes. BP remains at 100s. INR stable at goal. Had CTA neck showed no ICA stenosis but ascending aorta mural thrombus.     OBJECTIVE Temp:  [97.4 F (36.3 C)-97.8 F (36.6 C)] 97.8 F (36.6 C) (02/02 2000) Pulse Rate:  [75-114] 76 (02/02 2000) Cardiac Rhythm: Atrial fibrillation (02/02 2029) Resp:  [19-24] 19 (02/02 2000) BP: (86-109)/(47-68) 86/47 (02/02 2000) SpO2:  [79 %-100 %] 100 % (02/02 2000) Weight:  [169 lb 4.8 oz (76.8 kg)] 169 lb 4.8 oz (76.8 kg) (02/02 0615)  CBC:   Recent Labs Lab 10/21/16 0204 10/21/16 0237  WBC 6.8  --   NEUTROABS 4.6  --   HGB 12.6 13.3  HCT 40.3 39.0  MCV 93.3  --   PLT 187  --     Basic Metabolic Panel:   Recent Labs Lab 10/21/16 0204 10/21/16 0237 10/23/16 0558  NA 140 141 138  K 4.8 4.8 4.3  CL 103 105 102  CO2 27  --  30  GLUCOSE 89 90 89  BUN 31* 34* 25*  CREATININE 1.16* 1.20* 1.05*  CALCIUM 9.5  --  8.7*    Lipid Panel:     Component Value Date/Time   CHOL 149 10/21/2016 0407   TRIG 62 10/21/2016 0407   HDL 78 10/21/2016 0407   CHOLHDL 1.9 10/21/2016 0407   VLDL 12 10/21/2016 0407   LDLCALC 59 10/21/2016 0407   HgbA1c:  Lab Results  Component Value Date   HGBA1C 5.7 (H) 10/21/2016   Urine Drug Screen:     Component Value Date/Time   LABOPIA NONE DETECTED 10/21/2016 0524   COCAINSCRNUR NONE DETECTED 10/21/2016 0524   LABBENZ NONE DETECTED 10/21/2016 0524   AMPHETMU NONE DETECTED 10/21/2016 0524   THCU NONE DETECTED 10/21/2016 0524   LABBARB NONE DETECTED 10/21/2016 0524      IMAGING I have personally reviewed the radiological images below and agree with the radiology interpretations.  Ct Head Code Stroke W/o Cm 10/21/2016 1. Negative CT HEAD for age. 2. ASPECTS is 10.   Mr Brain Wo Contrast 10/21/2016 1. No acute intracranial abnormality. 2. Mild chronic small vessel ischemic disease. 3.  No major intracranial vessel occlusion or significant stenosis.   Mr Jodene Nam Head Wo Contrast 10/21/2016 3-4 mm right paraophthalmic ICA aneurysm.   2-D echocardiogram - Left ventricle: The cavity size was normal. Wall thickness was normal. Systolic function was normal. The estimated ejection fraction was in the range of 55% to 60%. Wall motion was normal; there were no regional wall motion abnormalities. Doppler parameters are consistent with high ventricular filling pressure. - Ventricular septum: The contour showed diastolic flattening and systolic flattening. - Aortic valve: Valve mobility was restricted. - Mitral valve: Calcified annulus. There was mild regurgitation. Valve area by continuity equation (using LVOT flow): 1.14 cm^2. - Left atrium: The atrium was severely dilated. - Right ventricle: The cavity size was moderately dilated. Systolic function was moderately reduced. - Right atrium: The atrium was severely dilated. - Atrial septum: There was an atrial septal aneurysm. - Tricuspid valve: There was severe regurgitation. - Pulmonary arteries: Systolic pressure was moderately to severely increased. PA peak pressure: 62 mm Hg (S). - Pericardium, extracardiac: A trivial pericardial effusion was identified. Impressions:   Normal LV systolic function; elevated LV filling pressure; D shaped septum; biatrial enlargement; moderate RVE with moderately reduced RV function; mild MR; severe TR with  moderate to severe elevation in pulmonary pressure.  Carotid Doppler - The vertebral arteries appear patent with antegrade flow. - Findings consistent with 34 - 3 percent stenosis involving the right internal carotid artery and the left internal carotid artery.  Ct Angio Neck W Or Wo Contrast 10/23/2016 IMPRESSION: 1. Ascending aortic 7 x 7 x 20 mm mural thrombus with potential for embolism. 2. Severe stenosis LEFT subclavian and LEFT vertebral artery origins. Mild stenosis RIGHT vertebral artery  origin. 3. Severe atherosclerosis without hemodynamically significant stenosis of the internal carotid artery's. 4. Moderate canal stenosis C6-7. Severe C3-4 thru C6-7 neural foraminal narrowing.   Dg Chest Port 1 View 10/23/2016 IMPRESSION: Mild CHF stable from the prior exam.    PHYSICAL EXAM  Temp:  [97.4 F (36.3 C)-97.8 F (36.6 C)] 97.8 F (36.6 C) (02/02 2000) Pulse Rate:  [75-114] 76 (02/02 2000) Resp:  [19-24] 19 (02/02 2000) BP: (86-109)/(47-68) 86/47 (02/02 2000) SpO2:  [79 %-100 %] 100 % (02/02 2000) Weight:  [169 lb 4.8 oz (76.8 kg)] 169 lb 4.8 oz (76.8 kg) (02/02 0615)  General - Well nourished, well developed, in no apparent distress.  Ophthalmologic - Fundi not visualized due to noncooperative.  Cardiovascular - irregularly irregular heart rate and rhythm.  Mental Status -  Level of arousal and orientation to time, place, and person were intact. Language including expression, naming, repetition, comprehension was assessed and found intact. Fund of Knowledge was assessed and was intact.  Cranial Nerves II - XII - II - Visual field intact OU. III, IV, VI - Extraocular movements intact. V - Facial sensation intact bilaterally. VII - Facial movement intact bilaterally. VIII - Hard of hearing & vestibular intact bilaterally. X - Palate elevates symmetrically. XI - Chin turning & shoulder shrug intact bilaterally. XII - Tongue protrusion intact.  Motor Strength - The patient's strength was normal in all extremities and pronator drift was absent.  Bulk was normal and fasciculations were absent.   Motor Tone - Muscle tone was assessed at the neck and appendages and was normal.  Reflexes - The patient's reflexes were 1+ in all extremities and she had no pathological reflexes.  Sensory - Light touch, temperature/pinprick were assessed and were symmetrical.    Coordination - The patient had normal movements in the hands and feet with no ataxia or dysmetria.  Tremor  was absent.  Gait and Station - deferred.   ASSESSMENT/PLAN Ms. MATSUE PICCO is a 83 y.o. female with history of HTN, HLD, afib, on chronic anticoagulation, presumed COPD presenting with LUE ataxia and hand clumsiness. She did not receive IV t-PA due to rapidly resolving deficits.   R brain TIA - likely due to ascending aorta mural thrombus, but INR has been at goal and stable. Not sure if her INR fluctuate during interval time between INR checks, will recommend DOACs such as eliquis to replace coumadin.   MRI  No acute stroke. small vessel disease   MRA  3-19mm R paraopthalmic ICA aneurysm  Carotid Doppler  bilateral ICA 40-59%  2D Echo  EF 55-60%. No source of embolus   CTA neck no significant ICA stenosis but ascending aorta mural thrombus  LDL 59  HgbA1c 5.7  Warfarin for VTE prophylaxis Diet Heart Room service appropriate? Yes; Fluid consistency: Thin  warfarin daily prior to admission, now on warfarin daily. INR 2.55 today. Not sure if her INR fluctuate during interval time between INR checks, will recommend DOACs such as eliquis to replace coumadin.  Patient  counseled to be compliant with her antithrombotic medications  Ongoing aggressive stroke risk factor management  Therapy recommendations:  HH PT and OT  Disposition:  pending   Ascending aorta thrombus  Likely to explain her TIA symptoms  Concerning for INR fluctuation between INR checks  Recommend DOACs such as eliquis  Cardiology and CTVS on board  On surgical intervention needed  Atrial Fibrillation  Home anticoagulation:  warfarin daily continued in the hospital  INR 2.15 ->2.5->2.55  INR has been stable at goal over time  Not sure if her INR fluctuate during interval time between INR checks, will recommend DOACs such as eliquis to replace coumadin.  CHF   Cardiology on board  On diuresis  Avoid hypotension  hypotension Hx of Hypertension  BP low and intermittent dizziness at  home  Take atenolol at home, recommend to stop - for RVR may consider cardizem  May consider midodrine if still low  Orthostatic vital negative Long-term BP goal normotensive   Hyperlipidemia  Home meds:  pravachol 40, resumed in hospital  LDL 59, goal < 70  Continue statin at discharge  Other Stroke Risk Factors  Advanced age  Family hx stroke (mother)  Other Active Problems  Hx benign colon lesion  Chronic kidney disease stage III - Cre 1.20  Hospital day # 1  Neurology will sign off. Please call with questions. Pt will follow up with Dr. Erlinda Hong at [facility] in about 6 weeks. Thanks for the consult.   Rosalin Hawking, MD PhD Stroke Neurology 10/23/2016 10:49 PM   To contact Stroke Continuity provider, please refer to [email or web address] After hours, contact General Neurology

## 2016-10-24 DIAGNOSIS — I272 Pulmonary hypertension, unspecified: Secondary | ICD-10-CM

## 2016-10-24 DIAGNOSIS — E7800 Pure hypercholesterolemia, unspecified: Secondary | ICD-10-CM

## 2016-10-24 DIAGNOSIS — I7410 Embolism and thrombosis of unspecified parts of aorta: Secondary | ICD-10-CM

## 2016-10-24 DIAGNOSIS — E785 Hyperlipidemia, unspecified: Secondary | ICD-10-CM

## 2016-10-24 LAB — BASIC METABOLIC PANEL
Anion gap: 10 (ref 5–15)
BUN: 23 mg/dL — ABNORMAL HIGH (ref 6–20)
CALCIUM: 8.5 mg/dL — AB (ref 8.9–10.3)
CO2: 29 mmol/L (ref 22–32)
CREATININE: 1.03 mg/dL — AB (ref 0.44–1.00)
Chloride: 100 mmol/L — ABNORMAL LOW (ref 101–111)
GFR, EST AFRICAN AMERICAN: 55 mL/min — AB (ref >60)
GFR, EST NON AFRICAN AMERICAN: 47 mL/min — AB (ref >60)
Glucose, Bld: 84 mg/dL (ref 65–99)
Potassium: 4.1 mmol/L (ref 3.5–5.1)
Sodium: 139 mmol/L (ref 135–145)

## 2016-10-24 LAB — CBC
HEMATOCRIT: 37 % (ref 36.0–46.0)
Hemoglobin: 11.5 g/dL — ABNORMAL LOW (ref 12.0–15.0)
MCH: 29.1 pg (ref 26.0–34.0)
MCHC: 31.1 g/dL (ref 30.0–36.0)
MCV: 93.7 fL (ref 78.0–100.0)
PLATELETS: 151 10*3/uL (ref 150–400)
RBC: 3.95 MIL/uL (ref 3.87–5.11)
RDW: 15.7 % — AB (ref 11.5–15.5)
WBC: 7.2 10*3/uL (ref 4.0–10.5)

## 2016-10-24 LAB — GLUCOSE, CAPILLARY: Glucose-Capillary: 100 mg/dL — ABNORMAL HIGH (ref 65–99)

## 2016-10-24 LAB — PROTIME-INR
INR: 2.51
PROTHROMBIN TIME: 27.6 s — AB (ref 11.4–15.2)

## 2016-10-24 MED ORDER — FUROSEMIDE 10 MG/ML IJ SOLN
40.0000 mg | Freq: Every day | INTRAMUSCULAR | Status: DC
  Filled 2016-10-24: qty 4

## 2016-10-24 MED ORDER — PHYTONADIONE 5 MG PO TABS
2.5000 mg | ORAL_TABLET | Freq: Once | ORAL | Status: AC
  Administered 2016-10-24: 2.5 mg via ORAL
  Filled 2016-10-24: qty 1

## 2016-10-24 NOTE — Progress Notes (Signed)
PROGRESS NOTE    Erica Bridges  [id number] DOB: 08/18/1927 DOA: 10/21/2016 PCP: Ria Bush, MD     Brief Narrative:  83 yo WF PMHx HTN, chronic A. Fib on Coumadin,Cardiomegaly, Asthma, HLD,Malignant neoplasm of corpus uteri, except isthmus ,Polycythemia  Presented with new onset left hand numbness and slurred speech. By the initial physical examination she had recovered her function. Admitted for further workup.  2/1 am with resp distress, CXR with CHF    Subjective: 2/3  overnight at approximately 0241 rapid response call secondary to patient having altered mental status. Patient found to be A/O 2 thought she's at the doctor's office. MAE, follows commands. PERRL HR-83, BP-89/43, RR-15, sats 100% on RA. Currently A/O 4. Does not recall overnight incident with Rapid Response.     Assessment & Plan:   Principal Problem:   TIA (transient ischemic attack) Active Problems:   HYPERCHOLESTEROLEMIA   Essential hypertension   Atrial fibrillation (HCC)   Medicare annual wellness visit, subsequent   Chronic anticoagulation   CHF (congestive heart failure) (HCC)   SOB (shortness of breath)   Acute respiratory distress   Thrombus of aorta (HCC)   Arterial hypotension   TIA -Patient had presented with left hand numbness and slurred speech with rapid improvement with symptoms and a such TPA was not initiated. Patient was placed on telemetry and underwent a stroke workup.  -Head CT which was obtained was negative.  -MRI of the head obtained was negative.  -MRA of the head showed a 3-4 mm right paraophthalmic ICA aneurysm.  -Carotid Doppler showed bilateral ICA 40-59% stenosis. 2 DL coordinate had a EF of 55-60% with no source of emboli. Hemoglobin A1c was 5.7. LDL of 59. Patient noted to have a therapeutic INR. CT angiogram neck was concerning for a sending aortic 7 x 7 x 20 mm mural thrombus with potential for embolism, severe stenosis left subclavian and left vertebral  artery origins. Mild stenosis right vertebral artery origin. PT/OT. Neurology recommended patient be changed from Coumadin to eliquis for secondary stroke prevention. INR currently at 2.5. Coumadin to be held until INR is around 2 and then Elliquis may be started. Patient has also been seen in consultation by CT surgery secondary to this descending aortic thrombus and CT surgery recommended addition of baby aspirin to Coumadin for her chronic atrial fibrillation. Will need to discuss with neurology for final decision is secondary stroke prevention.  Acute on Chronic Diastolic CHF -Patient noted to be significantly short of breath and noted to be an acute CHF exacerbation. Patient is volume loaded overloaded on examination.  -Decrease Lasix 40 mg IV daily. Due to patient's borderline blood pressure   -Strict I&O -Daily weight -Cardiology consulted. .  Chronic atrial fibrillation CHA2DS2VASC score >3 -Rate controlled -Patient currently on atenolol however dose of be decreased in order to be able to diureses patient adequately. Monitor heart rate. - Patient after long discussion with cardiology will start patient on Eliquis  when INR less than 2.  -Vitamin K 2.5 mg 1 Pharmacy to continue to monitor. Recent Labs Lab 10/21/16 0204 10/22/16 0328 10/23/16 0558 10/24/16 0423  INR 2.15 2.50 2.55 2.51    HTN  -Atenolol per cardiology   Dyslipidemia LDL of 59. Continue statin.  CKD stage III Lab Results  Component Value Date   CREATININE 1.03 (H) 10/24/2016   CREATININE 1.05 (H) 10/23/2016   CREATININE 1.20 (H) 10/21/2016  -Stable.  Ascending aortic thrombus Per CT angiogram of the neck. Patient has  been seen in consultation by cardiothoracic surgery who feel that patient is not a candidate for open surgical repair.. Interventional radiology attempt to remove mural thrombus would only expose the patient to high results of stroke per CT surgery. CT surgery recommending adding 81 mg  aspirin per day to patient's Coumadin for chronic A. fib as patient has not been on aspirin before. Aspirin has been started. Appreciate CT surgery's input and recommendations.    DVT prophylaxis: Coumadin--> Eliquis Code Status: Full Family Communication: Daughter present for discussion of plan of care Disposition Plan: Once patient on Eliquis, and diuresed.   Consultants:  Cardiology Cardiothoracic surgery Neurology    Procedures/Significant Events:  2/1 Echocardiogram: LVEF=  55% to 60%. -Doppler   parameters are consistent with high ventricular filling pressure. - Left atrium: severely dilated. - Right ventricle: moderately dilated.-- Right atrium: severely dilated. - Atrial septum: septal aneurysm. - Tricuspid valve: severe regurgitation. - Pulmonary arteries:  PA peak pressure: 62 mm Hg (S).    VENTILATOR SETTINGS:    Cultures   Antimicrobials:    Devices    LINES / TUBES:      Continuous Infusions:   Objective: Vitals:   10/23/16 2000 10/24/16 0100 10/24/16 1000 10/24/16 1006  BP: (!) 86/47 (!) 89/43 (!) 88/49 (!) 88/49  Pulse: 76 83 79 (!) 51  Resp: 19 15  17   Temp: 97.8 F (36.6 C)     TempSrc: Oral     SpO2: 100% 100%  100%  Weight:      Height:        Intake/Output Summary (Last 24 hours) at 10/24/16 1316 Last data filed at 10/24/16 0931  Gross per 24 hour  Intake              480 ml  Output              525 ml  Net              -45 ml   Filed Weights   10/21/16 1654 10/22/16 0405 10/23/16 0615  Weight: 76.2 kg (168 lb 1.6 oz) 76.5 kg (168 lb 10.4 oz) 76.8 kg (169 lb 4.8 oz)    Examination:  General: A/O 4, NAD, cachectic, No acute respiratory distress Eyes: negative scleral hemorrhage, negative anisocoria, negative icterus ENT: Negative Runny nose, negative gingival bleeding, Neck:  Negative scars, masses, torticollis, lymphadenopathy, JVD Lungs: Clear to auscultation bilaterally, positive RLL crackles Cardiovascular:  Irregular irregular rhythm and rate, without murmur gallop or rub normal S1 and S2 Abdomen: negative abdominal pain, nondistended, positive soft, bowel sounds, no rebound, no ascites, no appreciable mass Extremities: No significant cyanosis, clubbing, or edema bilateral lower extremities Skin: Negative rashes, lesions, ulcers Psychiatric:  Negative depression, negative anxiety, negative fatigue, negative mania  Central nervous system:  Cranial nerves II through XII intact, tongue/uvula midline, all extremities muscle strength 5/5, sensation intact throughout, negative dysarthria, negative expressive aphasia, negative receptive aphasia.  .     Data Reviewed: Care during the described time interval was provided by me .  I have reviewed this patient's available data, including medical history, events of note, physical examination, and all test results as part of my evaluation. I have personally reviewed and interpreted all radiology studies.  CBC:  Recent Labs Lab 10/21/16 0204 10/21/16 0237 10/24/16 0423  WBC 6.8  --  7.2  NEUTROABS 4.6  --   --   HGB 12.6 13.3 11.5*  HCT 40.3 39.0 37.0  MCV 93.3  --  93.7  PLT 187  --  [id number]   Basic Metabolic Panel:  Recent Labs Lab 10/21/16 0204 10/21/16 0237 10/23/16 0558 10/24/16 0423  NA 140 141 138 139  K 4.8 4.8 4.3 4.1  CL 103 105 102 100*  CO2 27  --  30 29  GLUCOSE 89 90 89 84  BUN 31* 34* 25* 23*  CREATININE 1.16* 1.20* 1.05* 1.03*  CALCIUM 9.5  --  8.7* 8.5*   GFR: Estimated Creatinine Clearance: 40.2 mL/min (by C-G formula based on SCr of 1.03 mg/dL (H)). Liver Function Tests:  Recent Labs Lab 10/21/16 0204  AST 25  ALT 16  ALKPHOS 76  BILITOT 0.7  PROT 6.4*  ALBUMIN 3.3*   No results for input(s): LIPASE, AMYLASE in the last 168 hours. No results for input(s): AMMONIA in the last 168 hours. Coagulation Profile:  Recent Labs Lab 10/21/16 0204 10/22/16 0328 10/23/16 0558 10/24/16 0423  INR 2.15 2.50 2.55  2.51   Cardiac Enzymes: No results for input(s): CKTOTAL, CKMB, CKMBINDEX, TROPONINI in the last 168 hours. BNP (last 3 results) No results for input(s): PROBNP in the last 8760 hours. HbA1C: No results for input(s): HGBA1C in the last 72 hours. CBG:  Recent Labs Lab 10/21/16 0200 10/24/16 0103  GLUCAP 78 100*   Lipid Profile: No results for input(s): CHOL, HDL, LDLCALC, TRIG, CHOLHDL, LDLDIRECT in the last 72 hours. Thyroid Function Tests: No results for input(s): TSH, T4TOTAL, FREET4, T3FREE, THYROIDAB in the last 72 hours. Anemia Panel: No results for input(s): VITAMINB12, FOLATE, FERRITIN, TIBC, IRON, RETICCTPCT in the last 72 hours. Sepsis Labs: No results for input(s): PROCALCITON, LATICACIDVEN in the last 168 hours.  No results found for this or any previous visit (from the past 240 hour(s)).       Radiology Studies: Ct Angio Neck W Or Wo Contrast  Result Date: 10/23/2016 CLINICAL DATA:  Transient ischemic attack. History of atrial fibrillation, hypertension, hyperlipidemia. EXAM: CT ANGIOGRAPHY NECK TECHNIQUE: Multidetector CT imaging of the neck was performed using the standard protocol during bolus administration of intravenous contrast. Multiplanar CT image reconstructions and MIPs were obtained to evaluate the vascular anatomy. Carotid stenosis measurements (when applicable) are obtained utilizing NASCET criteria, using the distal internal carotid diameter as the denominator. CONTRAST:  50 cc Isovue 370 COMPARISON:  CT and MRI head October 21, 2016, MRA head October 21, 2016 FINDINGS: AORTIC ARCH: 7 x 7 mm posterior ascending thoracic aorta thrombus extends 2 cm into the central lumen. Moderate calcific atherosclerosis of the aortic arch. Severe stenosis LEFT subclavian artery origin. Common origin of the innominate and LEFT Common carotid artery. RIGHT CAROTID SYSTEM: Common carotid artery is widely patent, coursing in a straight line fashion. Severe eccentric calcific  atherosclerosis results in less than 50% stenosis. Mild luminal irregularity attributable to atherosclerosis cervical internal carotid artery. LEFT CAROTID SYSTEM: Common carotid artery is widely patent, coursing in a straight line fashion. Mild eccentric calcific atherosclerosis. Normal appearance of the carotid bifurcation without hemodynamically significant stenosis by NASCET criteria. Patent internal carotid artery. VERTEBRAL ARTERIES:Severe stenosis LEFT vertebral artery origin due to the calcific atherosclerosis. Mild stenosis RIGHT vertebral artery origin. Bilateral vertebral arteries are patent. Extrinsic deformity due to degenerative cervical spine. SKELETON: No acute osseous process though bone windows have not been submitted. Cervicothoracic levoscoliosis and multi level severe degenerative changes cervical spine. Moderate canal stenosis C6-7. Severe C3-4 thru C6-7 neural foraminal narrowing. OTHER NECK: Soft tissues of the neck are nonacute though, not tailored for evaluation. Mild  centrilobular emphysema in the included lung apices. Main pulmonary artery is 3.4 cm in transaxial dimension, associated with chronic pulmonary arterial hypertension . IMPRESSION: 1. Ascending aortic 7 x 7 x 20 mm mural thrombus with potential for embolism. 2. Severe stenosis LEFT subclavian and LEFT vertebral artery origins. Mild stenosis RIGHT vertebral artery origin. 3. Severe atherosclerosis without hemodynamically significant stenosis of the internal carotid artery's. 4. Moderate canal stenosis C6-7. Severe C3-4 thru C6-7 neural foraminal narrowing. These results will be called to the ordering clinician or representative by the Radiologist Assistant, and communication documented in the [employer] Dashboard Electronically Signed   By: Elon Alas M.D.   On: 10/23/2016 01:51   Dg Chest Port 1 View  Result Date: 10/23/2016 CLINICAL DATA:  Congestive failure EXAM: PORTABLE CHEST 1 VIEW COMPARISON:  10/22/2016 FINDINGS:  Cardiac shadow is enlarged. Mild vascular congestion is again identified and stable. No new focal infiltrate or sizable effusion is seen. No bony abnormality is noted. IMPRESSION: Mild CHF stable from the prior exam. Electronically Signed   By: Inez Catalina M.D.   On: 10/23/2016 07:36        Scheduled Meds: .  stroke: mapping our early stages of recovery book   Does not apply Once  . aspirin EC  81 mg Oral Daily  . atenolol  12.5 mg Oral Daily  . furosemide  40 mg Intravenous BID  . guaiFENesin  600 mg Oral BID  . potassium chloride  10 mEq Oral Daily  . pravastatin  40 mg Oral q1800   Continuous Infusions:   LOS: 2 days    Time spent:40 min    Berania Peedin, Geraldo Docker, MD [facility] Pager [phone]  If 7PM-7AM, please contact night-coverage [email or web address] Password [email or web address] 10/24/2016, 1:16 PM

## 2016-10-24 NOTE — Progress Notes (Signed)
Physical Therapy Treatment Patient Details Name: Erica Bridges MRN: [id number] DOB: 1927-02-10 Today's Date: 10/24/2016    History of Present Illness 83 y.o.femalewith medical history significant of HTN, HLD, afib, on chronic anticoagulation, presumed COPD; who presents with complaints of left hand weakness. MRI on 1/31 negative for acute findings.    PT Comments    Nursing request to monitor BP during treatment session today.  Generally a little low, MAP 71-77, better after mild activity.  Patient agreeable to therapy treatment.  Supervision overall with 2 mild loss of balance episodes during turning today.  Kept patient in room due to close monitoring of vitals.  Will continue to work with patient acutely.  Follow Up Recommendations  SNF;Supervision/Assistance - 24 hour;Supervision for mobility/OOB     Equipment Recommendations  None recommended by PT    Recommendations for Other Services OT consult     Precautions / Restrictions Precautions Precautions: Fall Precaution Comments: watch O2 sats and BP Restrictions Weight Bearing Restrictions: No    Mobility  Bed Mobility               General bed mobility comments: Up in chair on arrival  Transfers Overall transfer level: Needs assistance Equipment used: Rolling walker (2 wheeled) Transfers: Sit to/from Stand Sit to Stand: Min guard         General transfer comment: Min gurad for safety; requires min assist for standing balance during functional activities.  Ambulation/Gait Ambulation/Gait assistance: Min guard Ambulation Distance (Feet): 20 Feet (x2) Assistive device: Rolling walker (2 wheeled) Gait Pattern/deviations: Step-through pattern;Decreased stride length;Trunk flexed Gait velocity: decreased   General Gait Details: Slow, mildly unsteady gait on 3L 02, maintaing saturation.  BP lower this afternoon, monitored during session. 1 seated rest break.  Pt with decreased self awareness of need to sit when  dizzy, 2 near loss of balance episodes.   Stairs            Wheelchair Mobility    Modified Rankin (Stroke Patients Only)       Balance Overall balance assessment: Needs assistance Sitting-balance support: Feet supported;No upper extremity supported Sitting balance-Leahy Scale: Good     Standing balance support: No upper extremity supported;During functional activity Standing balance-Leahy Scale: Poor Standing balance comment: Min assist for standing balance                    Cognition Arousal/Alertness: Awake/alert Behavior During Therapy: WFL for tasks assessed/performed Overall Cognitive Status: Within Functional Limits for tasks assessed                      Exercises      General Comments General comments (skin integrity, edema, etc.): lower BP today, monitored as well as O2      Pertinent Vitals/Pain Pain Assessment: No/denies pain    Home Living Family/patient expects to be discharged to:: Private residence Living Arrangements: Alone;Children Available Help at Discharge: Family;Available 24 hours/day Type of Home: House Home Access: Level entry   Home Layout: One level Home Equipment: Walker - 4 wheels;Shower seat - built in;Grab bars - toilet;Grab bars - tub/shower      Prior Function Level of Independence: Independent with assistive device(s)      Comments: rollator for mobility; reports some falls.   PT Goals (current goals can now be found in the care plan section) Acute Rehab PT Goals Patient Stated Goal: rehab then home PT Goal Formulation: With patient Time For Goal Achievement: 11/05/16 Potential to  Achieve Goals: Good Progress towards PT goals: Not progressing toward goals - comment (Recent downgrade of PT plan from Home to SNF)    Frequency    Min 3X/week      PT Plan Current plan remains appropriate    Co-evaluation             End of Session Equipment Utilized During Treatment: Gait  belt;Oxygen Activity Tolerance: Treatment limited secondary to medical complications (Comment) (drop in Sp02 with mobility, monitoring BP.) Patient left: in chair;with call bell/phone within reach;with chair alarm set;with nursing/sitter in room     Time: [id number] PT Time Calculation (min) (ACUTE ONLY): 25 min  Charges:  $Gait Training: 8-22 mins                    G CodesZenia Resides, Savanah Bayles L 2016/11/20, 6:21 PM

## 2016-10-24 NOTE — Progress Notes (Signed)
Progress Note  Patient Name: Erica Bridges Date of Encounter: 10/24/2016  Primary Cardiologist: [facility])  Subjective   Feeling very tired today. Denies dyspnea. Denies angina. No new neurological complaints.  Inpatient Medications    Scheduled Meds: .  stroke: mapping our early stages of recovery book   Does not apply Once  . aspirin EC  81 mg Oral Daily  . atenolol  12.5 mg Oral Daily  . furosemide  40 mg Intravenous BID  . guaiFENesin  600 mg Oral BID  . potassium chloride  10 mEq Oral Daily  . pravastatin  40 mg Oral q1800   Continuous Infusions:  PRN Meds: acetaminophen **OR** acetaminophen (TYLENOL) oral liquid 160 mg/5 mL **OR** acetaminophen, albuterol, polyvinyl alcohol, senna-docusate   Vital Signs    Vitals:   10/23/16 2000 10/24/16 0100 10/24/16 1000 10/24/16 1006  BP: (!) 86/47 (!) 89/43 (!) 88/49 (!) 88/49  Pulse: 76 83 79 (!) 51  Resp: 19 15  17   Temp: 97.8 F (36.6 C)     TempSrc: Oral     SpO2: 100% 100%  100%  Weight:      Height:        Intake/Output Summary (Last 24 hours) at 10/24/16 1057 Last data filed at 10/24/16 0931  Gross per 24 hour  Intake              720 ml  Output              825 ml  Net             -105 ml   Filed Weights   10/21/16 1654 10/22/16 0405 10/23/16 0615  Weight: 76.2 kg (168 lb 1.6 oz) 76.5 kg (168 lb 10.4 oz) 76.8 kg (169 lb 4.8 oz)    Telemetry    Atrial fibrillation with satisfactory ventricular rate control - Personally Reviewed  ECG    atrial fibrillation  - Personally Reviewed  Physical Exam  Sleepy, but well oriented and coherent. GEN: No acute distress.   Neck:  JVD to the angle of the jaw bilaterally Cardiac: Irregular, 3/6 holosystolic murmur heard throughout the anterior precordium, no diastolic murmurs, rubs, or gallops.  Respiratory: Clear to auscultation bilaterally. GI: Soft, nontender, non-distended  MS: No edema; No deformity. Neuro:  Nonfocal  Psych: Normal affect   Labs      Chemistry Recent Labs Lab 10/21/16 0204 10/21/16 0237 10/23/16 0558 10/24/16 0423  NA 140 141 138 139  K 4.8 4.8 4.3 4.1  CL 103 105 102 100*  CO2 27  --  30 29  GLUCOSE 89 90 89 84  BUN 31* 34* 25* 23*  CREATININE 1.16* 1.20* 1.05* 1.03*  CALCIUM 9.5  --  8.7* 8.5*  PROT 6.4*  --   --   --   ALBUMIN 3.3*  --   --   --   AST 25  --   --   --   ALT 16  --   --   --   ALKPHOS 76  --   --   --   BILITOT 0.7  --   --   --   GFRNONAA 41*  --  46* 47*  GFRAA 48*  --  54* 55*  ANIONGAP 10  --  6 10     Hematology Recent Labs Lab 10/21/16 0204 10/21/16 0237 10/24/16 0423  WBC 6.8  --  7.2  RBC 4.32  --  3.95  HGB 12.6  13.3 11.5*  HCT 40.3 39.0 37.0  MCV 93.3  --  93.7  MCH 29.2  --  29.1  MCHC 31.3  --  31.1  RDW 16.2*  --  15.7*  PLT 187  --  151    Cardiac EnzymesNo results for input(s): TROPONINI in the last 168 hours.  Recent Labs Lab 10/21/16 0235  TROPIPOC 0.02     BNP Recent Labs Lab 10/22/16 0920  BNP 661.9*     DDimer No results for input(s): DDIMER in the last 168 hours.   Radiology    Ct Angio Neck W Or Wo Contrast  Result Date: 10/23/2016 CLINICAL DATA:  Transient ischemic attack. History of atrial fibrillation, hypertension, hyperlipidemia. EXAM: CT ANGIOGRAPHY NECK TECHNIQUE: Multidetector CT imaging of the neck was performed using the standard protocol during bolus administration of intravenous contrast. Multiplanar CT image reconstructions and MIPs were obtained to evaluate the vascular anatomy. Carotid stenosis measurements (when applicable) are obtained utilizing NASCET criteria, using the distal internal carotid diameter as the denominator. CONTRAST:  50 cc Isovue 370 COMPARISON:  CT and MRI head October 21, 2016, MRA head October 21, 2016 FINDINGS: AORTIC ARCH: 7 x 7 mm posterior ascending thoracic aorta thrombus extends 2 cm into the central lumen. Moderate calcific atherosclerosis of the aortic arch. Severe stenosis LEFT subclavian artery  origin. Common origin of the innominate and LEFT Common carotid artery. RIGHT CAROTID SYSTEM: Common carotid artery is widely patent, coursing in a straight line fashion. Severe eccentric calcific atherosclerosis results in less than 50% stenosis. Mild luminal irregularity attributable to atherosclerosis cervical internal carotid artery. LEFT CAROTID SYSTEM: Common carotid artery is widely patent, coursing in a straight line fashion. Mild eccentric calcific atherosclerosis. Normal appearance of the carotid bifurcation without hemodynamically significant stenosis by NASCET criteria. Patent internal carotid artery. VERTEBRAL ARTERIES:Severe stenosis LEFT vertebral artery origin due to the calcific atherosclerosis. Mild stenosis RIGHT vertebral artery origin. Bilateral vertebral arteries are patent. Extrinsic deformity due to degenerative cervical spine. SKELETON: No acute osseous process though bone windows have not been submitted. Cervicothoracic levoscoliosis and multi level severe degenerative changes cervical spine. Moderate canal stenosis C6-7. Severe C3-4 thru C6-7 neural foraminal narrowing. OTHER NECK: Soft tissues of the neck are nonacute though, not tailored for evaluation. Mild centrilobular emphysema in the included lung apices. Main pulmonary artery is 3.4 cm in transaxial dimension, associated with chronic pulmonary arterial hypertension . IMPRESSION: 1. Ascending aortic 7 x 7 x 20 mm mural thrombus with potential for embolism. 2. Severe stenosis LEFT subclavian and LEFT vertebral artery origins. Mild stenosis RIGHT vertebral artery origin. 3. Severe atherosclerosis without hemodynamically significant stenosis of the internal carotid artery's. 4. Moderate canal stenosis C6-7. Severe C3-4 thru C6-7 neural foraminal narrowing. These results will be called to the ordering clinician or representative by the Radiologist Assistant, and communication documented in the [facility] Dashboard Electronically Signed    By: Elon Alas M.D.   On: 10/23/2016 01:51   Dg Chest Port 1 View  Result Date: 10/23/2016 CLINICAL DATA:  Congestive failure EXAM: PORTABLE CHEST 1 VIEW COMPARISON:  10/22/2016 FINDINGS: Cardiac shadow is enlarged. Mild vascular congestion is again identified and stable. No new focal infiltrate or sizable effusion is seen. No bony abnormality is noted. IMPRESSION: Mild CHF stable from the prior exam. Electronically Signed   By: Inez Catalina M.D.   On: 10/23/2016 07:36    Cardiac Studies   Echo 10/22/2016 - Left ventricle: The cavity size was normal. Wall thickness was  normal. Systolic function was normal. The estimated ejection   fraction was in the range of 55% to 60%. Wall motion was normal;   there were no regional wall motion abnormalities. Doppler   parameters are consistent with high ventricular filling pressure. - Ventricular septum: The contour showed diastolic flattening and   systolic flattening. - Aortic valve: Valve mobility was restricted. - Mitral valve: Calcified annulus. There was mild regurgitation.   Valve area by continuity equation (using LVOT flow): 1.14 cm^2. - Left atrium: The atrium was severely dilated. - Right ventricle: The cavity size was moderately dilated. Systolic   function was moderately reduced. - Right atrium: The atrium was severely dilated. - Atrial septum: There was an atrial septal aneurysm. - Tricuspid valve: There was severe regurgitation. - Pulmonary arteries: Systolic pressure was moderately to severely   increased. PA peak pressure: 62 mm Hg (S). - Pericardium, extracardiac: A trivial pericardial effusion was   identified.  Impressions:  - Normal LV systolic function; elevated LV filling pressure; D   shaped septum; biatrial enlargement; moderate RVE with moderately   reduced RV function; mild MR; severe TR with moderate to severe   elevation in pulmonary pressure.  Patient Profile     83 y.o. female with long-standing  atrial fibrillation, acute on chronic diastolic heart failure and the large mural thrombus of the ascending aorta, presenting with a transient neurological event.  Assessment & Plan    1. Ao thrombus: As outlined in Dr. Lucianne Lei Trigt's note, any mechanical attempts to remove the thrombus are associated with worse neurological outcomes. She is in no way a candidate for surgical treatment. Clinically available data generally suggests that oral anticoagulants do not help and may sometimes worsen the embolic potential, as they prevent plaque healing. She needs anticoagulation anyway for her atrial fibrillation. The best proven treatments to prevent embolic events related to aortic atherosclerosis is aggressive lipid lowering. She is on a statin and her LDL is 62. I think the best we can offer her for this problem is watchful waiting. 2. Permanent atrial fibrillation: Her prothrombin time has generally been very closely monitored and well controlled. I doubt that variations and warfarin effectiveness are the cause for her current predicament. On the other hand, it is highly likely that she will no longer be able to drive and go to her prothrombin time checks. It's quite reasonable to switch her to a direct oral anticoagulant such as Eliquis. Despite her advanced age, she should receive the full 5 mg twice daily dose since she weighs over 60 kg and has normal renal function. Rate control remains adequate even after the decrease in beta blocker dose yesterday. 3. Acute on chronic diastolic heart failure: She has edema and severe JVD, but also has severe tricuspid regurgitation and moderate to severe pulmonary hypertension. Therefore it is challenging to use her physical exam to assess volume status. She seems to be able to lie flat without any breathing difficulty. Her weight has really not changed since admission and her net fluid balance is slightly positive. Intravenous diuretics were just started yesterday, may need  to increase the dose slightly. Physical and echo findings of right heart failure clearly outweigh the signs and symptoms of left heart disease. She does have a history of smoking and wheezing. Not sure whether she has been evaluated for sleep apnea.  Signed, Sanda Klein, MD  10/24/2016, 10:57 AM

## 2016-10-24 NOTE — Significant Event (Signed)
Rapid Response Event Note  Overview:Called concerning AMS. Time Called: 0100 Arrival Time: 0100 Event Type: Neurologic  Initial Focused Assessment: Pt alert, oriented X 2, thinks she's at the doctor's office. MAE, follows commands. PERRL HR-83, BP-89/43, RR-15, sats 100% on RA. Interventions: cbg checked-100       Plan of Care (if not transferred): Monitor mental status. Bed alarm on.  Call RRT with concerns. Event Summary: Name of Physician Notified:  (MD not notified at this time) at [place]    at    Outcome: Stayed in room and stabalized  Event End Time: 0103  Dillard [place]

## 2016-10-24 NOTE — Progress Notes (Signed)
ANTICOAGULATION CONSULT NOTE - Follow Up Consult  Pharmacy Consult for Coumadin to Eliquis Indication: atrial fibrillation, ascending aorta mural thrombus, TIA  Patient Measurements: Height: 5\' 9"  (175.3 cm) Weight: 169 lb 4.8 oz (76.8 kg) IBW/kg (Calculated) : 66.2  Vital Signs: Temp: 97.9 F (36.6 C) (02/03 1337) Temp Source: Oral (02/03 1337) BP: 99/56 (02/03 1337) Pulse Rate: 75 (02/03 1337)  Labs:  Recent Labs  10/22/16 0328 10/23/16 0558 10/24/16 0423  HGB  --   --  11.5*  HCT  --   --  37.0  PLT  --   --  151  LABPROT 27.5* 27.9* 27.6*  INR 2.50 2.55 2.51  CREATININE  --  1.05* 1.03*    Estimated Creatinine Clearance: 40.2 mL/min (by C-G formula based on SCr of 1.03 mg/dL (H)).  Assessment:  83yo female presented 10/21/16 with acute onset of LUE ataxia and loss of hand dexterity, deficits rapidly improved in ED. CT on 2/2 revealed ascending aorta mural thrombus. Had been on Coumadin prior to admission, INR 2.15 on admit 1/31.  Coumadin held on 2/2 in anticipation of transition to Eliquis.  INR remains therapeutic at 2.51.  Last Coumadin dose: 5 mg on 10/22/16.  As noted, qualifies for full-dose Eliquis despite advanced age, with weight >60 kg and creatinine < 1.5.  Goal of Therapy:  therapeutic anticoagulation with Eliquis Monitor platelets by anticoagulation protocol: Yes   Plan:   Continue daily PT/INR.  Begin Eliquis 5 mg BID when INR < 2.0.  Arty Baumgartner, [place] Pager: [phone] 10/24/2016,2:41 PM

## 2016-10-25 DIAGNOSIS — I5033 Acute on chronic diastolic (congestive) heart failure: Secondary | ICD-10-CM

## 2016-10-25 DIAGNOSIS — I952 Hypotension due to drugs: Secondary | ICD-10-CM

## 2016-10-25 DIAGNOSIS — N183 Chronic kidney disease, stage 3 unspecified: Secondary | ICD-10-CM

## 2016-10-25 DIAGNOSIS — I5032 Chronic diastolic (congestive) heart failure: Secondary | ICD-10-CM

## 2016-10-25 LAB — BASIC METABOLIC PANEL
ANION GAP: 9 (ref 5–15)
BUN: 24 mg/dL — ABNORMAL HIGH (ref 6–20)
CALCIUM: 8.9 mg/dL (ref 8.9–10.3)
CHLORIDE: 96 mmol/L — AB (ref 101–111)
CO2: 33 mmol/L — AB (ref 22–32)
Creatinine, Ser: 0.89 mg/dL (ref 0.44–1.00)
GFR calc non Af Amer: 57 mL/min — ABNORMAL LOW (ref >60)
Glucose, Bld: 106 mg/dL — ABNORMAL HIGH (ref 65–99)
Potassium: 3.9 mmol/L (ref 3.5–5.1)
Sodium: 138 mmol/L (ref 135–145)

## 2016-10-25 LAB — PROTIME-INR
INR: 1.66
PROTHROMBIN TIME: 19.8 s — AB (ref 11.4–15.2)

## 2016-10-25 LAB — MAGNESIUM: Magnesium: 1.6 mg/dL — ABNORMAL LOW (ref 1.7–2.4)

## 2016-10-25 MED ORDER — DIGOXIN 250 MCG PO TABS
0.2500 mg | ORAL_TABLET | ORAL | Status: AC
  Administered 2016-10-25 (×3): 0.25 mg via ORAL
  Filled 2016-10-25 (×3): qty 1

## 2016-10-25 MED ORDER — FUROSEMIDE 10 MG/ML IJ SOLN
40.0000 mg | Freq: Two times a day (BID) | INTRAMUSCULAR | Status: DC
  Administered 2016-10-25 – 2016-10-27 (×4): 40 mg via INTRAVENOUS
  Filled 2016-10-25 (×6): qty 4

## 2016-10-25 MED ORDER — DIGOXIN 250 MCG PO TABS
0.2500 mg | ORAL_TABLET | Freq: Every day | ORAL | Status: DC
  Administered 2016-10-26: 0.25 mg via ORAL
  Filled 2016-10-25: qty 1

## 2016-10-25 MED ORDER — APIXABAN 5 MG PO TABS
5.0000 mg | ORAL_TABLET | Freq: Two times a day (BID) | ORAL | Status: DC
  Administered 2016-10-25 – 2016-10-29 (×9): 5 mg via ORAL
  Filled 2016-10-25 (×8): qty 1

## 2016-10-25 NOTE — Progress Notes (Signed)
ANTICOAGULATION CONSULT NOTE - Follow Up Consult  Pharmacy Consult for Coumadin to Eliquis Indication: atrial fibrillation, ascending aorta mural thrombus, TIA  Patient Measurements: Height: 5\' 9"  (175.3 cm) Weight: 166 lb 14.2 oz (75.7 kg) IBW/kg (Calculated) : 66.2  Vital Signs: Temp: 98 F (36.7 C) (02/04 0545) Temp Source: Oral (02/04 0545) BP: 102/63 (02/04 0545) Pulse Rate: 92 (02/04 1303)  Labs:  Recent Labs  10/23/16 0558 10/24/16 0423 10/25/16 0244  HGB  --  11.5*  --   HCT  --  37.0  --   PLT  --  151  --   LABPROT 27.9* 27.6* 19.8*  INR 2.55 2.51 1.66  CREATININE 1.05* 1.03* 0.89    Estimated Creatinine Clearance: 46.5 mL/min (by C-G formula based on SCr of 0.89 mg/dL).  Assessment:  83yo female presented 10/21/16 with acute onset of LUE ataxia and loss of hand dexterity, deficits rapidly improved in ED. CT on 2/2 revealed ascending aorta mural thrombus. Had been on Coumadin prior to admission, INR 2.15 on admit 1/31.  Coumadin held on 2/2 in anticipation of transition to Eliquis. Last Coumadin dose was 5 mg on 10/22/16.  INR down to 1.66 today.   As noted, qualifies for full-dose Eliquis despite advanced age, with weight >60 kg and creatinine < 1.5.  Goal of Therapy:  therapeutic anticoagulation with Eliquis Monitor platelets by anticoagulation protocol: Yes   Plan:   Begin Eliquis 5 mg BID.  Intermittent CBC.  PT/INR one more day.  Arty Baumgartner, [place] Pager: [phone] 10/25/2016,1:16 PM

## 2016-10-25 NOTE — Progress Notes (Addendum)
PROGRESS NOTE    Erica Bridges  [id number] DOB: 1926/12/15 DOA: 10/21/2016 PCP: Ria Bush, MD     Brief Narrative:  83 yo WF PMHx HTN, chronic A. Fib on Coumadin,Cardiomegaly, Asthma, HLD,Malignant neoplasm of corpus uteri, except isthmus ,Polycythemia  Presented with new onset left hand numbness and slurred speech. By the initial physical examination she had recovered her function. Admitted for further workup.  2/1 am with resp distress, CXR with CHF    Subjective: 2/4   A/O 4. Negative CP, negative SOB.     Assessment & Plan:   Principal Problem:   TIA (transient ischemic attack) Active Problems:   HYPERCHOLESTEROLEMIA   Essential hypertension   Atrial fibrillation (HCC)   Medicare annual wellness visit, subsequent   Chronic anticoagulation   CHF (congestive heart failure) (HCC)   SOB (shortness of breath)   Acute respiratory distress   Thrombus of aorta (HCC)   Arterial hypotension   TIA -Patient had presented with left hand numbness and slurred speech with rapid improvement with symptoms and a such TPA was not initiated.  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  -Carotid Doppler showed bilateral ICA 40-59% stenosis. 2 DL coordinate had a EF of 55-60% with no source of emboli. Hemoglobin A1c was 5.7. LDL of 59. Patient noted to have a therapeutic INR. CT angiogram neck was concerning for a sending aortic 7 x 7 x 20 mm mural thrombus with potential for embolism, severe stenosis left subclavian and left vertebral artery origins. Mild stenosis right vertebral artery origin. PT/OT. Neurology recommended patient be changed from Coumadin to eliquis for secondary stroke prevention. INR currently at 2.5. Coumadin to be held until INR is around 2 and then Elliquis may be started. Patient has also been seen in consultation by CT surgery secondary to this descending aortic thrombus and CT surgery recommended addition  of baby aspirin to Coumadin for her chronic atrial fibrillation. Will need to discuss with neurology for final decision is secondary stroke prevention.  Acute on Chronic Diastolic CHF -Patient noted to be significantly short of breath and noted to be an acute CHF exacerbation. Patient is volume loaded overloaded on examination.  -Decrease Lasix 40 mg IV daily. Due to patient's borderline blood pressure   -Strict I&O since admission +675ml -Daily weight Filed Weights   10/23/16 0615 10/25/16 0451 10/25/16 0500  Weight: 76.8 kg (169 lb 4.8 oz) 75.7 kg (166 lb 14.2 oz) 75.7 kg (166 lb 14.2 oz)  -Cardiology consulted. .  Chronic atrial fibrillation CHA2DS2VASC score >3 -Rate controlled -Patient currently on atenolol however dose of be decreased in order to be able to diureses patient adequately. Monitor heart rate. - Patient after long discussion with cardiology will start patient on Eliquis  when INR less than 2.  -Vitamin K 2.5 mg 1 Pharmacy to continue to monitor. Recent Labs Lab 10/21/16 0204 10/22/16 0328 10/23/16 0558 10/24/16 0423 10/25/16 0244  INR 2.15 2.50 2.55 2.51 1.66  -2/4 pharmacy should start Eliquis today -DC Atenolol per cardiology -Start Digoxin per cardiology  HTN  -See A. fib  Dyslipidemia LDL of 59. Continue statin.  CKD stage III Lab Results  Component Value Date   CREATININE 0.89 10/25/2016   CREATININE 1.03 (H) 10/24/2016   CREATININE 1.05 (H) 10/23/2016  -Stable.  Ascending Aortic thrombus -Per CT angiogram of the neck. Patient has been seen in consultation by cardiothoracic surgery who feel that patient is not a candidate for  open surgical repair.. Interventional radiology attempt to remove mural thrombus would only expose the patient to high results of stroke per CT surgery. CT surgery recommending adding 81 mg aspirin per day to patient's Coumadin for chronic A. fib as patient has not been on aspirin before. Aspirin has been started.    Goals of care -On 2/5 requests CSW begin SNF search    DVT prophylaxis: Coumadin--> Eliquis Code Status: Full Family Communication: Daughter present for discussion of plan of care Disposition Plan: Once patient on Eliquis, and diuresed, OT/PT recommends SNF.   Consultants:  Cardiology Cardiothoracic surgery Neurology    Procedures/Significant Events:  1/31 CT Head negative acute infarct 1/31 MRI brain Wo contrast:chronic lacunar infarct in the body of the right caudate nucleus -3-4 mm right paraophthalmic ICA aneurysm 2/1 Echocardiogram: LVEF=  55% to 60%. -Doppler   parameters are consistent with high ventricular filling pressure. - Left atrium: severely dilated. - Right ventricle: moderately dilated.-- Right atrium: severely dilated. - Atrial septum: septal aneurysm. - Tricuspid valve: severe regurgitation. - Pulmonary arteries:  PA peak pressure: 62 mm Hg (S). 2/2 CT angiogram neck:-Ascending aortic 7 x 7 x 20 mm mural thrombus with potential for embolism. -Severe stenosis LEFT subclavian and LEFT vertebral artery origins. Mild stenosis RIGHT vertebral artery origin. - Moderate canal stenosis C6-7. Severe C3-4 thru C6-7 neural foraminal narrowing.   VENTILATOR SETTINGS:    Cultures   Antimicrobials: Anti-infectives    None       Devices    LINES / TUBES:      Continuous Infusions:   Objective: Vitals:   10/24/16 1337 10/25/16 0451 10/25/16 0500 10/25/16 0545  BP: (!) 99/56   102/63  Pulse: 75   96  Resp: 18   18  Temp: 97.9 F (36.6 C)   98 F (36.7 C)  TempSrc: Oral   Oral  SpO2: 100%   96%  Weight:  75.7 kg (166 lb 14.2 oz) 75.7 kg (166 lb 14.2 oz)   Height:        Intake/Output Summary (Last 24 hours) at 10/25/16 [id number] Last data filed at 10/24/16 2300  Gross per 24 hour  Intake              480 ml  Output              300 ml  Net              180 ml   Filed Weights   10/23/16 0615 10/25/16 0451 10/25/16 0500  Weight: 76.8 kg  (169 lb 4.8 oz) 75.7 kg (166 lb 14.2 oz) 75.7 kg (166 lb 14.2 oz)    Examination:  General: A/O 4, NAD, cachectic, No acute respiratory distress Eyes: negative scleral hemorrhage, negative anisocoria, negative icterus ENT: Negative Runny nose, negative gingival bleeding, Neck:  Negative scars, masses, torticollis, lymphadenopathy, JVD Lungs: Clear to auscultation bilaterally,  Cardiovascular: Irregular irregular rhythm and rate, without murmur gallop or rub normal S1 and S2 Abdomen: negative abdominal pain, nondistended, positive soft, bowel sounds, no rebound, no ascites, no appreciable mass Extremities: No significant cyanosis, clubbing, or edema bilateral lower extremities Skin: Negative rashes, lesions, ulcers Psychiatric:  Negative depression, negative anxiety, negative fatigue, negative mania  Central nervous system:  Cranial nerves II through XII intact, tongue/uvula midline, all extremities muscle strength 5/5, sensation intact throughout, negative dysarthria, negative expressive aphasia, negative receptive aphasia.  .     Data Reviewed: Care during the described time interval was provided by me .  I have reviewed this patient's available data, including medical history, events of note, physical examination, and all test results as part of my evaluation. I have personally reviewed and interpreted all radiology studies.  CBC:  Recent Labs Lab 10/21/16 0204 10/21/16 0237 10/24/16 0423  WBC 6.8  --  7.2  NEUTROABS 4.6  --   --   HGB 12.6 13.3 11.5*  HCT 40.3 39.0 37.0  MCV 93.3  --  93.7  PLT 187  --  [id number]   Basic Metabolic Panel:  Recent Labs Lab 10/21/16 0204 10/21/16 0237 10/23/16 0558 10/24/16 0423 10/25/16 0244  NA 140 141 138 139 138  K 4.8 4.8 4.3 4.1 3.9  CL 103 105 102 100* 96*  CO2 27  --  30 29 33*  GLUCOSE 89 90 89 84 106*  BUN 31* 34* 25* 23* 24*  CREATININE 1.16* 1.20* 1.05* 1.03* 0.89  CALCIUM 9.5  --  8.7* 8.5* 8.9  MG  --   --   --   --   1.6*   GFR: Estimated Creatinine Clearance: 46.5 mL/min (by C-G formula based on SCr of 0.89 mg/dL). Liver Function Tests:  Recent Labs Lab 10/21/16 0204  AST 25  ALT 16  ALKPHOS 76  BILITOT 0.7  PROT 6.4*  ALBUMIN 3.3*   No results for input(s): LIPASE, AMYLASE in the last 168 hours. No results for input(s): AMMONIA in the last 168 hours. Coagulation Profile:  Recent Labs Lab 10/21/16 0204 10/22/16 0328 10/23/16 0558 10/24/16 0423 10/25/16 0244  INR 2.15 2.50 2.55 2.51 1.66   Cardiac Enzymes: No results for input(s): CKTOTAL, CKMB, CKMBINDEX, TROPONINI in the last 168 hours. BNP (last 3 results) No results for input(s): PROBNP in the last 8760 hours. HbA1C: No results for input(s): HGBA1C in the last 72 hours. CBG:  Recent Labs Lab 10/21/16 0200 10/24/16 0103  GLUCAP 78 100*   Lipid Profile: No results for input(s): CHOL, HDL, LDLCALC, TRIG, CHOLHDL, LDLDIRECT in the last 72 hours. Thyroid Function Tests: No results for input(s): TSH, T4TOTAL, FREET4, T3FREE, THYROIDAB in the last 72 hours. Anemia Panel: No results for input(s): VITAMINB12, FOLATE, FERRITIN, TIBC, IRON, RETICCTPCT in the last 72 hours. Sepsis Labs: No results for input(s): PROCALCITON, LATICACIDVEN in the last 168 hours.  No results found for this or any previous visit (from the past 240 hour(s)).       Radiology Studies: No results found.      Scheduled Meds: .  stroke: mapping our early stages of recovery book   Does not apply Once  . aspirin EC  81 mg Oral Daily  . atenolol  12.5 mg Oral Daily  . furosemide  40 mg Intravenous Daily  . guaiFENesin  600 mg Oral BID  . potassium chloride  10 mEq Oral Daily  . pravastatin  40 mg Oral q1800   Continuous Infusions:   LOS: 3 days    Time spent:40 min    Hiba Garry, Geraldo Docker, MD [facility] Pager [phone]  If 7PM-7AM, please contact night-coverage [email or web address] Password [email or web address] 10/25/2016, 8:06 AM

## 2016-10-25 NOTE — Discharge Instructions (Signed)
Information on my medicine - ELIQUIS (apixaban)  This medication education was reviewed with me or my healthcare representative as part of my discharge preparation.  The pharmacist that spoke with me during my hospital stay was:  Arty Baumgartner, [facility]  Why was Eliquis prescribed for you? Eliquis was prescribed for you to reduce the risk of a blood clot forming that can cause a stroke if you have a medical condition called atrial fibrillation (a type of irregular heartbeat).  Also for blood clot in blood vessel in the heart.  What do You need to know about Eliquis ? Take your Eliquis TWICE DAILY - one tablet in the morning and one tablet in the evening with or without food. If you have difficulty swallowing the tablet whole please discuss with your pharmacist how to take the medication safely.  Take Eliquis exactly as prescribed by your doctor and DO NOT stop taking Eliquis without talking to the doctor who prescribed the medication.  Stopping may increase your risk of developing a stroke.  Refill your prescription before you run out.  After discharge, you should have regular check-up appointments with your healthcare provider that is prescribing your Eliquis.  In the future your dose may need to be changed if your kidney function or weight changes by a significant amount or as you get older.  What do you do if you miss a dose? If you miss a dose, take it as soon as you remember on the same day and resume taking twice daily.  Do not take more than one dose of ELIQUIS at the same time to make up a missed dose.  Important Safety Information A possible side effect of Eliquis is bleeding. You should call your healthcare provider right away if you experience any of the following: ? Bleeding from an injury or your nose that does not stop. ? Unusual colored urine (red or dark brown) or unusual colored stools (red or black). ? Unusual bruising for unknown reasons. ? A serious fall or if  you hit your head (even if there is no bleeding).  Some medicines may interact with Eliquis and might increase your risk of bleeding or clotting while on Eliquis. To help avoid this, consult your healthcare provider or pharmacist prior to using any new prescription or non-prescription medications, including herbals, vitamins, non-steroidal anti-inflammatory drugs (NSAIDs) and supplements.  This website has more information on Eliquis (apixaban): http://www.eliquis.com/eliquis/home

## 2016-10-25 NOTE — Clinical Social Work Placement (Signed)
   CLINICAL SOCIAL WORK PLACEMENT  NOTE  Date:  10/25/2016  Patient Details  Name: Erica Bridges MRN: [id number] Date of Birth: 10/09/1926  Clinical Social Work is seeking post-discharge placement for this patient at the [place] level of care (*CSW will initial, date and re-position this form in  chart as items are completed):  Yes   Patient/family provided with [place] Work Department's list of facilities offering this level of care within the geographic area requested by the patient (or if unable, by the patient's family).  Yes   Patient/family informed of their freedom to choose among providers that offer the needed level of care, that participate in Medicare, Medicaid or managed care program needed by the patient, have an available bed and are willing to accept the patient.  Yes   Patient/family informed of [facility]'s ownership interest in [facility] and [facility], as well as of the fact that they are under no obligation to receive care at these facilities.  PASRR submitted to EDS on 10/25/16     PASRR number received on 10/25/16     Existing PASRR number confirmed on       FL2 transmitted to all facilities in geographic area requested by pt/family on 10/25/16     FL2 transmitted to all facilities within larger geographic area on       Patient informed that his/her managed care company has contracts with or will negotiate with certain facilities, including the following:            Patient/family informed of bed offers received.  Patient chooses bed at       Physician recommends and patient chooses bed at      Patient to be transferred to   on  .  Patient to be transferred to facility by       Patient family notified on   of transfer.  Name of family member notified:        PHYSICIAN       Additional Comment:    _______________________________________________ Serafina Mitchell, LCSWA 10/25/2016, 3:13 PM

## 2016-10-25 NOTE — NC FL2 (Signed)
[place] MEDICAID FL2 LEVEL OF CARE SCREENING TOOL     IDENTIFICATION  Patient Name: Erica Bridges Birthdate: [birth year] Sex: female Admission Date (Current Location): 10/21/2016  [facility] and [place] Number:  Herbalist and Address:  [facility], [place] [address], [place], [place] [address]      Provider Number: [id number]  Attending Physician Name and Address:  Allie Bossier, MD  Relative Name and Phone Number:  Son [place] [phone]    Current Level of Care: Hospital Recommended Level of Care: [place] Prior Approval Number:    Date Approved/Denied:   PASRR Number: [id number]  Discharge Plan: SNF    Current Diagnoses: Patient Active Problem List   Diagnosis Date Noted  . Acute on chronic diastolic CHF (congestive heart failure) ([place])   . Dyslipidemia   . CKD (chronic kidney disease), stage III   . Aortic thrombus ([place])   . CHF (congestive heart failure) ([place])   . SOB (shortness of breath)   . Acute respiratory distress   . Thrombus of aorta ([place])   . Arterial hypotension   . TIA (transient ischemic attack) 10/21/2016  . Chronic anticoagulation 10/21/2016  . Osteopenia 03/02/2016  . Midline low back pain without sciatica 06/20/2015  . Advanced care planning/counseling discussion 01/15/2015  . Dizziness 02/07/2014  . Medicare annual wellness visit, subsequent 12/05/2011  . Polycythemia 12/05/2011  . History of colon polyps 12/05/2011  . UNSPECIFIED MENOPAUSAL&POSTMENOPAUSAL DISORDER 09/19/2008  . CANCER, ENDOMETRIUM 02/22/2007  . HYPERCHOLESTEROLEMIA 02/22/2007  . Essential hypertension 02/22/2007  . REACTIVE AIRWAY DISEASE 02/22/2007  . DIVERTICULOSIS, COLON 02/22/2007  . DERMATITIS, OTHER ATOPIC 02/22/2007  . Atrial fibrillation ([place]) 02/03/2007    Orientation RESPIRATION BLADDER Height & Weight     Self  Normal Continent Weight: 166 lb 14.2 oz (75.7 kg) Height:  5\' 9"  (175.3 cm)   BEHAVIORAL SYMPTOMS/MOOD NEUROLOGICAL BOWEL NUTRITION STATUS      Continent Diet (See DC Summary)  AMBULATORY STATUS COMMUNICATION OF NEEDS Skin   Limited Assist Verbally Normal                       Personal Care Assistance Level of Assistance  Bathing, Dressing Bathing Assistance: Limited assistance   Dressing Assistance: Limited assistance     Functional Limitations Info  Sight, Speech, Hearing Sight Info: Adequate Hearing Info: Adequate Speech Info: Adequate    SPECIAL CARE FACTORS FREQUENCY  PT (By licensed PT), OT (By licensed OT)     PT Frequency: 5x wk OT Frequency: 5x wk            Contractures Contractures Info: Not present    Additional Factors Info  Code Status, Allergies Code Status Info: Full Allergies Info: Amoxicillin, Penicillins           Current Medications (10/25/2016):  This is the current hospital active medication list Current Facility-Administered Medications  Medication Dose Route Frequency Provider Last Rate Last Dose  .  stroke: mapping our early stages of recovery book   Does not apply Once Norval Morton, MD      . acetaminophen (TYLENOL) tablet 650 mg  650 mg Oral Q4H PRN Norval Morton, MD       Or  . acetaminophen (TYLENOL) solution 650 mg  650 mg Per Tube Q4H PRN Norval Morton, MD       Or  . acetaminophen (TYLENOL) suppository 650 mg  650 mg Rectal  Q4H PRN Norval Morton, MD      . albuterol (PROVENTIL) (2.5 MG/3ML) 0.083% nebulizer solution 3 mL  3 mL Inhalation Q4H PRN Tawni Millers, MD   3 mL at 10/22/16 0531  . apixaban (ELIQUIS) tablet 5 mg  5 mg Oral BID Skeet Simmer, RPH   5 mg at 10/25/16 1442  . aspirin EC tablet 81 mg  81 mg Oral Daily Ivin Poot, MD   81 mg at 10/25/16 [id number]  . digoxin (LANOXIN) tablet 0.25 mg  0.25 mg Oral Q2H Mihai Croitoru, MD   0.25 mg at 10/25/16 1442  . [START ON 10/26/2016] digoxin (LANOXIN) tablet 0.25 mg  0.25 mg Oral Daily Mihai Croitoru, MD      . furosemide (LASIX)  injection 40 mg  40 mg Intravenous BID Mihai Croitoru, MD      . guaiFENesin (MUCINEX) 12 hr tablet 600 mg  600 mg Oral BID Norval Morton, MD   600 mg at 10/25/16 0927  . polyvinyl alcohol (LIQUIFILM TEARS) 1.4 % ophthalmic solution 1 drop  1 drop Both Eyes QID PRN Rondell A Tamala Julian, MD      . potassium chloride (K-DUR,KLOR-CON) CR tablet 10 mEq  10 mEq Oral Daily Norval Morton, MD   10 mEq at 10/25/16 0926  . pravastatin (PRAVACHOL) tablet 40 mg  40 mg Oral q1800 Norval Morton, MD   40 mg at 10/24/16 1709  . senna-docusate (Senokot-S) tablet 1 tablet  1 tablet Oral QHS PRN Norval Morton, MD         Discharge Medications: Please see discharge summary for a list of discharge medications.  Relevant Imaging Results:  Relevant Lab Results:   Additional Information [place], Pearlina Friedly B, LCSWA

## 2016-10-25 NOTE — Progress Notes (Signed)
Progress Note  Patient Name: Erica Bridges Date of Encounter: 10/25/2016  Primary Cardiologist: [facility])  Subjective   Seems a little disoriented. Reading newspaper, but answers almost all questions with "I don't know". More alert than yesterday.  Inpatient Medications    Scheduled Meds: .  stroke: mapping our early stages of recovery book   Does not apply Once  . aspirin EC  81 mg Oral Daily  . atenolol  12.5 mg Oral Daily  . furosemide  40 mg Intravenous Daily  . guaiFENesin  600 mg Oral BID  . potassium chloride  10 mEq Oral Daily  . pravastatin  40 mg Oral q1800   Continuous Infusions:  PRN Meds: acetaminophen **OR** acetaminophen (TYLENOL) oral liquid 160 mg/5 mL **OR** acetaminophen, albuterol, polyvinyl alcohol, senna-docusate   Vital Signs    Vitals:   10/25/16 0451 10/25/16 0500 10/25/16 0545 10/25/16 0927  BP:   102/63   Pulse:   96 90  Resp:   18   Temp:   98 F (36.7 C)   TempSrc:   Oral   SpO2:   96%   Weight: 75.7 kg (166 lb 14.2 oz) 75.7 kg (166 lb 14.2 oz)    Height:        Intake/Output Summary (Last 24 hours) at 10/25/16 1122 Last data filed at 10/25/16 1000  Gross per 24 hour  Intake              480 ml  Output              300 ml  Net              180 ml   Filed Weights   10/23/16 0615 10/25/16 0451 10/25/16 0500  Weight: 76.8 kg (169 lb 4.8 oz) 75.7 kg (166 lb 14.2 oz) 75.7 kg (166 lb 14.2 oz)    Telemetry    Afib with generally adequate ventricular rate control. - Personally Reviewed  Physical Exam  Alert and oriented GEN: No acute distress.   Neck: No JVD Cardiac: Irregular, 3/6 holosystolic murmur heard throughout the anterior precordium, no murmurs, rubs, or gallops.  Respiratory: Clear to auscultation bilaterally. GI: Soft, nontender, non-distended  MS: No edema; No deformity. Neuro:  Nonfocal  Psych: Normal affect   Labs    Chemistry Recent Labs Lab 10/21/16 0204  10/23/16 0558 10/24/16 0423 10/25/16 0244  NA  140  < > 138 139 138  K 4.8  < > 4.3 4.1 3.9  CL 103  < > 102 100* 96*  CO2 27  --  30 29 33*  GLUCOSE 89  < > 89 84 106*  BUN 31*  < > 25* 23* 24*  CREATININE 1.16*  < > 1.05* 1.03* 0.89  CALCIUM 9.5  --  8.7* 8.5* 8.9  PROT 6.4*  --   --   --   --   ALBUMIN 3.3*  --   --   --   --   AST 25  --   --   --   --   ALT 16  --   --   --   --   ALKPHOS 76  --   --   --   --   BILITOT 0.7  --   --   --   --   GFRNONAA 41*  --  46* 47* 57*  GFRAA 48*  --  54* 55* >60  ANIONGAP 10  --  6 10  9  < > = values in this interval not displayed.   Hematology Recent Labs Lab 10/21/16 0204 10/21/16 0237 10/24/16 0423  WBC 6.8  --  7.2  RBC 4.32  --  3.95  HGB 12.6 13.3 11.5*  HCT 40.3 39.0 37.0  MCV 93.3  --  93.7  MCH 29.2  --  29.1  MCHC 31.3  --  31.1  RDW 16.2*  --  15.7*  PLT 187  --  151    Cardiac EnzymesNo results for input(s): TROPONINI in the last 168 hours.  Recent Labs Lab 10/21/16 0235  TROPIPOC 0.02     BNP Recent Labs Lab 10/22/16 0920  BNP 661.9*     DDimer No results for input(s): DDIMER in the last 168 hours.   Radiology    No results found.  Cardiac Studies   Echo 10/22/2016 - Left ventricle: The cavity size was normal. Wall thickness was normal. Systolic function was normal. The estimated ejection fraction was in the range of 55% to 60%. Wall motion was normal; there were no regional wall motion abnormalities. Doppler parameters are consistent with high ventricular filling pressure. - Ventricular septum: The contour showed diastolic flattening and systolic flattening. - Aortic valve: Valve mobility was restricted. - Mitral valve: Calcified annulus. There was mild regurgitation. Valve area by continuity equation (using LVOT flow): 1.14 cm^2. - Left atrium: The atrium was severely dilated. - Right ventricle: The cavity size was moderately dilated. Systolic function was moderately reduced. - Right atrium: The atrium was severely  dilated. - Atrial septum: There was an atrial septal aneurysm. - Tricuspid valve: There was severe regurgitation. - Pulmonary arteries: Systolic pressure was moderately to severely increased. PA peak pressure: 62 mm Hg (S). - Pericardium, extracardiac: A trivial pericardial effusion was identified.  Impressions:  - Normal LV systolic function; elevated LV filling pressure; D shaped septum; biatrial enlargement; moderate RVE with moderately reduced RV function; mild MR; severe TR with moderate to severe elevation in pulmonary pressure.  Patient Profile     83 y.o. female with long-standing atrial fibrillation, acute on chronic diastolic heart failure and a large mural thrombus of the ascending aorta, presenting with a transient neurological event  Assessment & Plan    1. Ao thrombus: As outlined in Dr. Lucianne Lei Trigt's note, any mechanical attempts to remove the thrombus are associated with worse neurological outcomes. She is in no way a candidate for surgical treatment. She needs anticoagulation anyway for her atrial fibrillation. On statin, her LDL is 62. I think the best we can offer her for this problem is watchful waiting. 2. Permanent atrial fibrillation: It's quite reasonable to switch her to a direct oral anticoagulant such as Eliquis. Despite her advanced age, she should receive the full 5 mg twice daily dose since she weighs over 60 kg and has normal renal function. Rate control remains adequate even after the decrease in beta blocker dose yesterday, but her BP is low. Will try to switch to digoxin, but this may not provide adequate rate control. 3. Acute on chronic diastolic heart failure: She has edema and severe JVD, but also has severe tricuspid regurgitation and moderate to severe pulmonary hypertension. Therefore it is challenging to use her physical exam to assess volume status. She is able to lie flat without any breathing difficulty. Her weight has really not changed  since admission and her net fluid balance is slightly positive. Her BP limits how aggressively we can give diuretics.  Physical and echo findings  of right heart failure clearly outweigh the signs and symptoms of left heart disease. She does have a history of smoking and wheezing. Not sure whether she has been evaluated for sleep apnea.  Signed, Sanda Klein, MD  10/25/2016, 11:22 AM

## 2016-10-26 DIAGNOSIS — Z0000 Encounter for general adult medical examination without abnormal findings: Secondary | ICD-10-CM

## 2016-10-26 DIAGNOSIS — G458 Other transient cerebral ischemic attacks and related syndromes: Secondary | ICD-10-CM

## 2016-10-26 LAB — BASIC METABOLIC PANEL
Anion gap: 7 (ref 5–15)
BUN: 20 mg/dL (ref 6–20)
CALCIUM: 8.7 mg/dL — AB (ref 8.9–10.3)
CO2: 34 mmol/L — ABNORMAL HIGH (ref 22–32)
Chloride: 97 mmol/L — ABNORMAL LOW (ref 101–111)
Creatinine, Ser: 0.78 mg/dL (ref 0.44–1.00)
GFR calc Af Amer: >60 mL/min (ref >60)
Glucose, Bld: 100 mg/dL — ABNORMAL HIGH (ref 65–99)
POTASSIUM: 3.6 mmol/L (ref 3.5–5.1)
SODIUM: 138 mmol/L (ref 135–145)

## 2016-10-26 LAB — PROTIME-INR
INR: 1.79
Prothrombin Time: 21 seconds — ABNORMAL HIGH (ref 11.4–15.2)

## 2016-10-26 LAB — MAGNESIUM: MAGNESIUM: 1.5 mg/dL — AB (ref 1.7–2.4)

## 2016-10-26 MED ORDER — DIGOXIN 125 MCG PO TABS
0.1250 mg | ORAL_TABLET | Freq: Every day | ORAL | Status: DC
  Administered 2016-10-27 – 2016-10-29 (×3): 0.125 mg via ORAL
  Filled 2016-10-26 (×3): qty 1

## 2016-10-26 MED ORDER — METOPROLOL TARTRATE 12.5 MG HALF TABLET
12.5000 mg | ORAL_TABLET | Freq: Two times a day (BID) | ORAL | Status: DC
  Administered 2016-10-26 – 2016-10-29 (×6): 12.5 mg via ORAL
  Filled 2016-10-26 (×6): qty 1

## 2016-10-26 NOTE — Progress Notes (Signed)
Progress Note  Patient Name: Erica Bridges Date of Encounter: 10/26/2016  Primary Cardiologist: [facility])  Subjective   Breathing is ok today. Sitting up eating lunch.   Inpatient Medications    Scheduled Meds: .  stroke: mapping our early stages of recovery book   Does not apply Once  . apixaban  5 mg Oral BID  . aspirin EC  81 mg Oral Daily  . digoxin  0.25 mg Oral Daily  . furosemide  40 mg Intravenous BID  . guaiFENesin  600 mg Oral BID  . potassium chloride  10 mEq Oral Daily  . pravastatin  40 mg Oral q1800   Continuous Infusions:  PRN Meds: acetaminophen **OR** acetaminophen (TYLENOL) oral liquid 160 mg/5 mL **OR** acetaminophen, albuterol, polyvinyl alcohol, senna-docusate   Vital Signs    Vitals:   10/26/16 0547 10/26/16 0700 10/26/16 0842 10/26/16 0848  BP: (!) 101/56  (!) 91/48   Pulse: 76   97  Resp:   17   Temp: 98.3 F (36.8 C)     TempSrc: Oral     SpO2:  96%    Weight: 166 lb 12.8 oz (75.7 kg)     Height:        Intake/Output Summary (Last 24 hours) at 10/26/16 1255 Last data filed at 10/26/16 0900  Gross per 24 hour  Intake              440 ml  Output              500 ml  Net              -60 ml   Filed Weights   10/25/16 0451 10/25/16 0500 10/26/16 0547  Weight: 166 lb 14.2 oz (75.7 kg) 166 lb 14.2 oz (75.7 kg) 166 lb 12.8 oz (75.7 kg)    Telemetry    Afib with generally adequate ventricular rate control. Rate 80s-100s- Personally Reviewed  Physical Exam  Alert and oriented GEN: No acute distress.   Neck: + JVD Cardiac: Irregular, 3/6 holosystolic murmur heard throughout the anterior precordium, no murmurs, rubs, or gallops.  Respiratory: Clear to auscultation bilaterally. GI: Soft, nontender, non-distended  MS: 2+ chronic pitting edema; No deformity. Neuro:  Nonfocal  Psych: Normal affect   Labs    Chemistry Recent Labs Lab 10/21/16 0204  10/24/16 0423 10/25/16 0244 10/26/16 0503  NA 140  < > 139 138 138  K 4.8  < >  4.1 3.9 3.6  CL 103  < > 100* 96* 97*  CO2 27  < > 29 33* 34*  GLUCOSE 89  < > 84 106* 100*  BUN 31*  < > 23* 24* 20  CREATININE 1.16*  < > 1.03* 0.89 0.78  CALCIUM 9.5  < > 8.5* 8.9 8.7*  PROT 6.4*  --   --   --   --   ALBUMIN 3.3*  --   --   --   --   AST 25  --   --   --   --   ALT 16  --   --   --   --   ALKPHOS 76  --   --   --   --   BILITOT 0.7  --   --   --   --   GFRNONAA 41*  < > 47* 57* >60  GFRAA 48*  < > 55* >60 >60  ANIONGAP 10  < > 10 9 7   < > =  values in this interval not displayed.   Hematology  Recent Labs Lab 10/21/16 0204 10/21/16 0237 10/24/16 0423  WBC 6.8  --  7.2  RBC 4.32  --  3.95  HGB 12.6 13.3 11.5*  HCT 40.3 39.0 37.0  MCV 93.3  --  93.7  MCH 29.2  --  29.1  MCHC 31.3  --  31.1  RDW 16.2*  --  15.7*  PLT 187  --  151    Cardiac EnzymesNo results for input(s): TROPONINI in the last 168 hours.   Recent Labs Lab 10/21/16 0235  TROPIPOC 0.02     BNP  Recent Labs Lab 10/22/16 0920  BNP 661.9*     DDimer No results for input(s): DDIMER in the last 168 hours.   Radiology    No results found.  Cardiac Studies   Echo 10/22/2016 - Left ventricle: The cavity size was normal. Wall thickness was normal. Systolic function was normal. The estimated ejection fraction was in the range of 55% to 60%. Wall motion was normal; there were no regional wall motion abnormalities. Doppler parameters are consistent with high ventricular filling pressure. - Ventricular septum: The contour showed diastolic flattening and systolic flattening. - Aortic valve: Valve mobility was restricted. - Mitral valve: Calcified annulus. There was mild regurgitation. Valve area by continuity equation (using LVOT flow): 1.14 cm^2. - Left atrium: The atrium was severely dilated. - Right ventricle: The cavity size was moderately dilated. Systolic function was moderately reduced. - Right atrium: The atrium was severely dilated. - Atrial septum: There  was an atrial septal aneurysm. - Tricuspid valve: There was severe regurgitation. - Pulmonary arteries: Systolic pressure was moderately to severely increased. PA peak pressure: 62 mm Hg (S). - Pericardium, extracardiac: A trivial pericardial effusion was identified.  Impressions:  - Normal LV systolic function; elevated LV filling pressure; D shaped septum; biatrial enlargement; moderate RVE with moderately reduced RV function; mild MR; severe TR with moderate to severe elevation in pulmonary pressure.  Patient Profile     83 y.o. female with long-standing atrial fibrillation, acute on chronic diastolic heart failure and a large mural thrombus of the ascending aorta, presenting with a transient neurological event  Assessment & Plan    1. Ao thrombus: As outlined in Dr. Lucianne Lei Trigt's note, any mechanical attempts to remove the thrombus are associated with worse neurological outcomes. She is in no way a candidate for surgical treatment. She needs anticoagulation anyway for her atrial fibrillation. -- On statin, her LDL is 62. Seems the best we can offer her for this problem is watchful waiting.  2. Permanent atrial fibrillation: She has been switched to Eliquis from coumadin given the finding of her thrombus. Despite her advanced age, she should receive the full 5 mg twice daily dose since she weighs over 60 kg and has normal renal function. -- Rate control remains adequate even after stopping beta blocker, but her BP remains low.  -- Dig added yesterday with HR controlled at this time  3. Acute on chronic diastolic heart failure: Noted edema and severe JVD, but also has severe tricuspid regurgitation and moderate to severe pulmonary hypertension. Challenging to use her physical exam to assess volume status. -- Weight is stable, Net +, but she is able to lie flat without breathing difficulty. Her BP limits how aggressively we can give diuretics. IV lasix held this morning. Cr is  stable. -- Physical and echo findings of right heart failure clearly outweigh the signs and symptoms of left  heart disease.  Signed, Reino Bellis, NP  10/26/2016, 12:55 PM     Patient seen and examined. Agree with assessment and plan. Now on ASA 81 mg and eliquis 5 mg bid for her mural thrombus of the ascending aorta and recent probable embolic stroke. AF rate now at 110 - 114 range. Dig was added; will decrease dose to .125 mg, and try resuming BB with  low dose metoprolol at 12.5 mg bid (was on atenolol 50 mg previously). D shaped septum c/w enlarged RV and severe TR.   Troy Sine, MD, [facility] 10/26/2016 5:45 PM

## 2016-10-26 NOTE — Progress Notes (Signed)
PROGRESS NOTE    Erica Bridges  [id number] DOB: 09-27-1926 DOA: 10/21/2016 PCP: Ria Bush, MD    Brief Narrative:  83 yo WF PMHx HTN, chronic A. Fib on Coumadin,Cardiomegaly, Asthma, HLD,Malignant neoplasm of corpus uteri, except isthmus ,Polycythemia  Presented with new onset left hand numbness and slurred speech. By the initial physical examination she had recovered her function. Admitted for further workup.  2/1 am with resp distress, CXR with CHF  Assessment & Plan:   Principal Problem:   TIA (transient ischemic attack) Active Problems:   HYPERCHOLESTEROLEMIA   Essential hypertension   Atrial fibrillation ([place])   Medicare annual wellness visit, subsequent   Chronic anticoagulation   CHF (congestive heart failure) (HCC)   SOB (shortness of breath)   Acute respiratory distress   Thrombus of aorta (HCC)   Arterial hypotension   Acute on chronic diastolic CHF (congestive heart failure) (HCC)   Dyslipidemia   CKD (chronic kidney disease), stage III   Aortic thrombus (HCC)   Transient cerebral ischemia   TIA -Patient had presented with left hand numbness and slurred speech with rapid improvement with symptoms and a such TPA was not initiated.  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  -Carotid Doppler showed bilateral ICA 40-59% stenosis. 2 DL coordinate had a EF of 55-60% with no source of emboli. Hemoglobin A1c was 5.7. LDL of 59.  -CT angiogram neck was concerning for a sending aortic 7 x 7 x 20 mm mural thrombus with potential for embolism, severe stenosis left subclavian and left vertebral artery origins. Mild stenosis right vertebral artery origin.  -Neurology recommended patient be changed from Coumadin to eliquis for secondary stroke prevention.   Acute on Chronic Diastolic CHF -Patient noted to be significantly short of breath and noted to be an acute CHF exacerbation.  - Currently on 40mg  bid  lasix -Wt today: 75.6kg - Wt on admit: 76.2kg - Cardiology following  Chronic atrial fibrillation CHA2DS2VASC score >3 -Rate controlled -Home atenolol held secondary to soft BP. Stopped by Cardiology -Continue Digoxin per cardiology  HTN  - Overnight stable - Cont current regimen  Dyslipidemia - LDL of 59.  - Continue statin as tolerated  CKD stage III - Labs reviewed. Stable - Repeat bmet in AM  Ascending Aortic thrombus -Noted on angiogram of the neck.  -Patient has been seen in consultation by cardiothoracic surgery who feel that patient is not a candidate for open surgical repair.  DVT prophylaxis: Eliquis Code Status: Full Family Communication: Pt in room, family at bedside Disposition Plan: Uncertain at this time  Consultants:   Cardiology  CT surgery  Procedures:     Antimicrobials: Anti-infectives    None      Subjective: No complaints  Objective: Vitals:   10/26/16 0547 10/26/16 0700 10/26/16 0842 10/26/16 0848  BP: (!) 101/56  (!) 91/48   Pulse: 76   97  Resp:   17   Temp: 98.3 F (36.8 C)     TempSrc: Oral     SpO2:  96%    Weight: 75.7 kg (166 lb 12.8 oz)     Height:        Intake/Output Summary (Last 24 hours) at 10/26/16 1401 Last data filed at 10/26/16 1300  Gross per 24 hour  Intake              580 ml  Output  300 ml  Net              280 ml   Filed Weights   10/25/16 0451 10/25/16 0500 10/26/16 0547  Weight: 75.7 kg (166 lb 14.2 oz) 75.7 kg (166 lb 14.2 oz) 75.7 kg (166 lb 12.8 oz)    Examination:  General exam: Awake, in nad  Respiratory system: normal chest rise, no audible wheezing Cardiovascular system: regular rate, s1-2 Gastrointestinal system: soft, nondistended, pos BS Central nervous system: no seizures, no tremors Extremities: perfused, no clubbing Skin: normal skin turgor, no notable skin lesions seen Psychiatry: mood normal// no visual hallucinations   Data Reviewed: I have personally  reviewed following labs and imaging studies  CBC:  Recent Labs Lab 10/21/16 0204 10/21/16 0237 10/24/16 0423  WBC 6.8  --  7.2  NEUTROABS 4.6  --   --   HGB 12.6 13.3 11.5*  HCT 40.3 39.0 37.0  MCV 93.3  --  93.7  PLT 187  --  [id number]   Basic Metabolic Panel:  Recent Labs Lab 10/21/16 0204 10/21/16 0237 10/23/16 0558 10/24/16 0423 10/25/16 0244 10/26/16 0503  NA 140 141 138 139 138 138  K 4.8 4.8 4.3 4.1 3.9 3.6  CL 103 105 102 100* 96* 97*  CO2 27  --  30 29 33* 34*  GLUCOSE 89 90 89 84 106* 100*  BUN 31* 34* 25* 23* 24* 20  CREATININE 1.16* 1.20* 1.05* 1.03* 0.89 0.78  CALCIUM 9.5  --  8.7* 8.5* 8.9 8.7*  MG  --   --   --   --  1.6* 1.5*   GFR: Estimated Creatinine Clearance: 51.8 mL/min (by C-G formula based on SCr of 0.78 mg/dL). Liver Function Tests:  Recent Labs Lab 10/21/16 0204  AST 25  ALT 16  ALKPHOS 76  BILITOT 0.7  PROT 6.4*  ALBUMIN 3.3*   No results for input(s): LIPASE, AMYLASE in the last 168 hours. No results for input(s): AMMONIA in the last 168 hours. Coagulation Profile:  Recent Labs Lab 10/22/16 0328 10/23/16 0558 10/24/16 0423 10/25/16 0244 10/26/16 0503  INR 2.50 2.55 2.51 1.66 1.79   Cardiac Enzymes: No results for input(s): CKTOTAL, CKMB, CKMBINDEX, TROPONINI in the last 168 hours. BNP (last 3 results) No results for input(s): PROBNP in the last 8760 hours. HbA1C: No results for input(s): HGBA1C in the last 72 hours. CBG:  Recent Labs Lab 10/21/16 0200 10/24/16 0103  GLUCAP 78 100*   Lipid Profile: No results for input(s): CHOL, HDL, LDLCALC, TRIG, CHOLHDL, LDLDIRECT in the last 72 hours. Thyroid Function Tests: No results for input(s): TSH, T4TOTAL, FREET4, T3FREE, THYROIDAB in the last 72 hours. Anemia Panel: No results for input(s): VITAMINB12, FOLATE, FERRITIN, TIBC, IRON, RETICCTPCT in the last 72 hours. Sepsis Labs: No results for input(s): PROCALCITON, LATICACIDVEN in the last 168 hours.  No results  found for this or any previous visit (from the past 240 hour(s)).   Radiology Studies: No results found.  Scheduled Meds: .  stroke: mapping our early stages of recovery book   Does not apply Once  . apixaban  5 mg Oral BID  . aspirin EC  81 mg Oral Daily  . digoxin  0.25 mg Oral Daily  . furosemide  40 mg Intravenous BID  . guaiFENesin  600 mg Oral BID  . potassium chloride  10 mEq Oral Daily  . pravastatin  40 mg Oral q1800   Continuous Infusions:   LOS: 4 days  Khamron Gellert, Orpah Melter, MD [facility] Pager [phone]  If 7PM-7AM, please contact night-coverage [email or web address] Password [email or web address] 10/26/2016, 2:01 PM

## 2016-10-26 NOTE — Progress Notes (Signed)
Occupational Therapy Treatment Patient Details Name: Erica Bridges MRN: [id number] DOB: [birth year] Today's Date: 10/26/2016    History of present illness 83 y.o.femalewith medical history significant of HTN, HLD, afib, on chronic anticoagulation, presumed COPD; who presents with complaints of left hand weakness. MRI on 1/31 negative for acute findings.   OT comments  Pt requiring assist to rise to stand today, min from chair, mod from toilet. Max assist for changing undergarment. Stood x 5 minutes at sink to wash hands and perform toothbrushing. Unable to get accurate 02 reading due to pt's hands being cold. Fatigues with minimal exertion. Continue to recommend post acute rehab in SNF.  Follow Up Recommendations  SNF;Supervision/Assistance - 24 hour    Equipment Recommendations  None recommended by OT    Recommendations for Other Services      Precautions / Restrictions Precautions Precautions: Fall Precaution Comments: watch 02 sats Restrictions Weight Bearing Restrictions: No       Mobility Bed Mobility Overal bed mobility: Needs Assistance Bed Mobility: Sit to Supine       Sit to supine: Supervision   General bed mobility comments: returned to bed  Transfers Overall transfer level: Needs assistance Equipment used: Rolling walker (2 wheeled) Transfers: Sit to/from Stand Sit to Stand: Min assist;Mod assist         General transfer comment: min from recliner, mod from toilet, cues for hand placement     Balance Overall balance assessment: Needs assistance   Sitting balance-Leahy Scale: Good       Standing balance-Leahy Scale: Poor                     ADL Overall ADL's : Needs assistance/impaired     Grooming: Oral care;Standing;Minimal assistance;Wash/dry hands Grooming Details (indicate cue type and reason): assist to open packages         Upper Body Dressing : Minimal assistance Upper Body Dressing Details (indicate cue type and reason):  decreased sequencing front opening gown Lower Body Dressing: Moderate assistance;Sit to/from stand Lower Body Dressing Details (indicate cue type and reason): assist to change pull up Toilet Transfer: Ambulation;RW;Regular Toilet;Moderate assistance Toilet Transfer Details (indicate cue type and reason): mod assist to control descent and to power up Toileting- Clothing Manipulation and Hygiene: Moderate assistance Toileting - Clothing Manipulation Details (indicate cue type and reason): able to pull down undergarment, assist to pull up     Functional mobility during ADLs: Minimal assistance;Rolling walker        Vision                     Perception     Praxis      Cognition   Behavior During Therapy: WFL for tasks assessed/performed Overall Cognitive Status: Impaired/Different from baseline Area of Impairment: Problem solving;Memory     Memory: Decreased short-term memory        Problem Solving: Slow processing;Difficulty sequencing;Requires verbal cues      Extremity/Trunk Assessment               Exercises     Shoulder Instructions       General Comments      Pertinent Vitals/ Pain       Pain Assessment: No/denies pain  Home Living  Prior Functioning/Environment              Frequency  Min 2X/week        Progress Toward Goals  OT Goals(current goals can now be found in the care plan section)  Progress towards OT goals: Not progressing toward goals - comment (pt demonstrating sequencing problems, weakness)  Acute Rehab OT Goals Patient Stated Goal: rehab then home Time For Goal Achievement: 11/04/16 Potential to Achieve Goals: Good  Plan Discharge plan remains appropriate    Co-evaluation                 End of Session Equipment Utilized During Treatment: Gait belt;Rolling walker;Oxygen   Activity Tolerance Patient limited by fatigue   Patient Left in  bed;with call bell/phone within reach;with bed alarm set   Nurse Communication Mobility status        Time: 1513-1550 OT Time Calculation (min): 37 min  Charges: OT General Charges $OT Visit: 1 Procedure OT Treatments $Self Care/Home Management : 23-37 mins  Malka So 10/26/2016, 4:02 PM  [phone]

## 2016-10-26 NOTE — Progress Notes (Signed)
PROGRESS NOTE    Erica Bridges  [id number] DOB: Jul 22, 1927 DOA: 10/21/2016 PCP: Ria Bush, MD    Brief Narrative:  83 yo WF PMHx HTN, chronic A. Fib on Coumadin,Cardiomegaly, Asthma, HLD,Malignant neoplasm of corpus uteri, except isthmus ,Polycythemia  Presented with new onset left hand numbness and slurred speech. By the initial physical examination she had recovered her function. Admitted for further workup.  2/1 am with resp distress, CXR with CHF  Assessment & Plan:   Principal Problem:   TIA (transient ischemic attack) Active Problems:   HYPERCHOLESTEROLEMIA   Essential hypertension   Atrial fibrillation ([place])   Medicare annual wellness visit, subsequent   Chronic anticoagulation   CHF (congestive heart failure) (HCC)   SOB (shortness of breath)   Acute respiratory distress   Thrombus of aorta (HCC)   Arterial hypotension   Acute on chronic diastolic CHF (congestive heart failure) (HCC)   Dyslipidemia   CKD (chronic kidney disease), stage III   Aortic thrombus (HCC)   Transient cerebral ischemia   TIA -Patient had presented with left hand numbness and slurred speech with rapid improvement with symptoms and a such TPA was not initiated.  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  -Carotid Doppler showed bilateral ICA 40-59% stenosis. 2 DL coordinate had a EF of 55-60% with no source of emboli. Hemoglobin A1c was 5.7. LDL of 59.  -CT angiogram neck was concerning for a sending aortic 7 x 7 x 20 mm mural thrombus with potential for embolism, severe stenosis left subclavian and left vertebral artery origins. Mild stenosis right vertebral artery origin.  -Neurology recommended patient be changed from Coumadin to eliquis for secondary stroke prevention.   Acute on Chronic Diastolic CHF -Patient noted to be significantly short of breath and noted to be an acute CHF exacerbation.  - Currently on 40mg  bid  lasix -Wt today: 75.6kg - Wt on admit: 76.2kg - Cardiology following  Chronic atrial fibrillation CHA2DS2VASC score >3 -Rate controlled -Home atenolol held secondary to soft BP. Stopped by Cardiology -Continue Digoxin per cardiology  HTN  - Overnight stable - Cont current regimen  Dyslipidemia - LDL of 59.  - Continue statin as tolerated  CKD stage III - Labs reviewed. Stable - Repeat bmet in AM  Ascending Aortic thrombus -Noted on angiogram of the neck.  -Patient has been seen in consultation by cardiothoracic surgery who feel that patient is not a candidate for open surgical repair.  DVT prophylaxis: Eliquis Code Status: Full Family Communication: Pt in room, family at bedside Disposition Plan: Uncertain at this time  Consultants:   Cardiology  CT surgery  Procedures:     Antimicrobials: Anti-infectives    None       Subjective: No complaints  Objective: Vitals:   10/26/16 0547 10/26/16 0700 10/26/16 0842 10/26/16 0848  BP: (!) 101/56  (!) 91/48   Pulse: 76   97  Resp:   17   Temp: 98.3 F (36.8 C)     TempSrc: Oral     SpO2:  96%    Weight: 75.7 kg (166 lb 12.8 oz)     Height:        Intake/Output Summary (Last 24 hours) at 10/26/16 1342 Last data filed at 10/26/16 1300  Gross per 24 hour  Intake              580 ml  Output  300 ml  Net              280 ml   Filed Weights   10/25/16 0451 10/25/16 0500 10/26/16 0547  Weight: 75.7 kg (166 lb 14.2 oz) 75.7 kg (166 lb 14.2 oz) 75.7 kg (166 lb 12.8 oz)    Examination:  General exam: Appears calm and comfortable  Respiratory system: Clear to auscultation. Respiratory effort normal. Cardiovascular system: S1 & S2 heard, RRR Gastrointestinal system: Abdomen is nondistended, soft and nontender. No organomegaly or masses felt. Normal bowel sounds heard. Central nervous system: Alert and oriented. No focal neurological deficits. Extremities: Symmetric 5 x 5 power. Skin: No  rashes, lesions Psychiatry: Judgement and insight appear normal. Mood & affect appropriate.   Data Reviewed: I have personally reviewed following labs and imaging studies  CBC:  Recent Labs Lab 10/21/16 0204 10/21/16 0237 10/24/16 0423  WBC 6.8  --  7.2  NEUTROABS 4.6  --   --   HGB 12.6 13.3 11.5*  HCT 40.3 39.0 37.0  MCV 93.3  --  93.7  PLT 187  --  [id number]   Basic Metabolic Panel:  Recent Labs Lab 10/21/16 0204 10/21/16 0237 10/23/16 0558 10/24/16 0423 10/25/16 0244 10/26/16 0503  NA 140 141 138 139 138 138  K 4.8 4.8 4.3 4.1 3.9 3.6  CL 103 105 102 100* 96* 97*  CO2 27  --  30 29 33* 34*  GLUCOSE 89 90 89 84 106* 100*  BUN 31* 34* 25* 23* 24* 20  CREATININE 1.16* 1.20* 1.05* 1.03* 0.89 0.78  CALCIUM 9.5  --  8.7* 8.5* 8.9 8.7*  MG  --   --   --   --  1.6* 1.5*   GFR: Estimated Creatinine Clearance: 51.8 mL/min (by C-G formula based on SCr of 0.78 mg/dL). Liver Function Tests:  Recent Labs Lab 10/21/16 0204  AST 25  ALT 16  ALKPHOS 76  BILITOT 0.7  PROT 6.4*  ALBUMIN 3.3*   No results for input(s): LIPASE, AMYLASE in the last 168 hours. No results for input(s): AMMONIA in the last 168 hours. Coagulation Profile:  Recent Labs Lab 10/22/16 0328 10/23/16 0558 10/24/16 0423 10/25/16 0244 10/26/16 0503  INR 2.50 2.55 2.51 1.66 1.79   Cardiac Enzymes: No results for input(s): CKTOTAL, CKMB, CKMBINDEX, TROPONINI in the last 168 hours. BNP (last 3 results) No results for input(s): PROBNP in the last 8760 hours. HbA1C: No results for input(s): HGBA1C in the last 72 hours. CBG:  Recent Labs Lab 10/21/16 0200 10/24/16 0103  GLUCAP 78 100*   Lipid Profile: No results for input(s): CHOL, HDL, LDLCALC, TRIG, CHOLHDL, LDLDIRECT in the last 72 hours. Thyroid Function Tests: No results for input(s): TSH, T4TOTAL, FREET4, T3FREE, THYROIDAB in the last 72 hours. Anemia Panel: No results for input(s): VITAMINB12, FOLATE, FERRITIN, TIBC, IRON, RETICCTPCT  in the last 72 hours. Sepsis Labs: No results for input(s): PROCALCITON, LATICACIDVEN in the last 168 hours.  No results found for this or any previous visit (from the past 240 hour(s)).   Radiology Studies: No results found.  Scheduled Meds: .  stroke: mapping our early stages of recovery book   Does not apply Once  . apixaban  5 mg Oral BID  . aspirin EC  81 mg Oral Daily  . digoxin  0.25 mg Oral Daily  . furosemide  40 mg Intravenous BID  . guaiFENesin  600 mg Oral BID  . potassium chloride  10 mEq Oral Daily  .  pravastatin  40 mg Oral q1800   Continuous Infusions:   LOS: 4 days   Zunairah Devers, Orpah Melter, MD [facility] Pager [phone]  If 7PM-7AM, please contact night-coverage [email or web address] Password [email or web address] 10/26/2016, 1:42 PM

## 2016-10-26 NOTE — Care Management Important Message (Signed)
Important Message  Patient Details  Name: Erica Bridges MRN: [id number] Date of Birth: 08/22/1927   Medicare Important Message Given:  Yes    Orbie Pyo 10/26/2016, 4:22 PM

## 2016-10-27 ENCOUNTER — Ambulatory Visit: Payer: Medicare Other

## 2016-10-27 LAB — BRAIN NATRIURETIC PEPTIDE: B Natriuretic Peptide: 764.3 pg/mL — ABNORMAL HIGH (ref 0.0–100.0)

## 2016-10-27 LAB — BASIC METABOLIC PANEL
ANION GAP: 9 (ref 5–15)
BUN: 21 mg/dL — AB (ref 6–20)
CO2: 37 mmol/L — ABNORMAL HIGH (ref 22–32)
Calcium: 8.6 mg/dL — ABNORMAL LOW (ref 8.9–10.3)
Chloride: 91 mmol/L — ABNORMAL LOW (ref 101–111)
Creatinine, Ser: 0.8 mg/dL (ref 0.44–1.00)
Glucose, Bld: 99 mg/dL (ref 65–99)
POTASSIUM: 3.8 mmol/L (ref 3.5–5.1)
SODIUM: 137 mmol/L (ref 135–145)

## 2016-10-27 LAB — MAGNESIUM: MAGNESIUM: 1.4 mg/dL — AB (ref 1.7–2.4)

## 2016-10-27 MED ORDER — MAGNESIUM SULFATE 4 GM/100ML IV SOLN
4.0000 g | Freq: Once | INTRAVENOUS | Status: AC
  Administered 2016-10-27: 4 g via INTRAVENOUS
  Filled 2016-10-27: qty 100

## 2016-10-27 NOTE — Progress Notes (Signed)
Physical Therapy Treatment Patient Details Name: Erica Bridges MRN: [id number] DOB: 05/08/1927 Today's Date: 10/27/2016    History of Present Illness 83 y.o.femalewith medical history significant of HTN, HLD, afib, on chronic anticoagulation, presumed COPD; who presents with complaints of left hand weakness. MRI on 1/31 negative for acute findings. Ct angiogram- abdominal aortic thrombus. CXR- CHF    PT Comments    Patient very lethargic today and mobility limited due to dizziness and soft BP. Bp sitting in chair 93/34, BP post return to supine 103/48. Rn notified. Pt reports feeling funny and not right today. Pt having a progressive downgrade in medical/functional status since evaluation and now requiring Mod A for transfers and standing. Will continue to follow and progress as tolerated.   Follow Up Recommendations  SNF;Supervision/Assistance - 24 hour;Supervision for mobility/OOB     Equipment Recommendations  None recommended by PT    Recommendations for Other Services       Precautions / Restrictions Precautions Precautions: Fall Precaution Comments: watch BP, 02 sats Restrictions Weight Bearing Restrictions: No    Mobility  Bed Mobility Overal bed mobility: Needs Assistance Bed Mobility: Sit to Supine       Sit to supine: Mod assist   General bed mobility comments: Assist to bring LEs to return to supine.   Transfers Overall transfer level: Needs assistance Equipment used: 1 person hand held assist Transfers: Sit to/from Stand Sit to Stand: Mod assist         General transfer comment: Assist to boost from recliner with cues for hand placement/technique, Increased difficulty. SPT chair to bed with mod A for support and imbalance.   Ambulation/Gait Ambulation/Gait assistance:  (Deferred today due to soft BP and dizziness.)               Stairs            Wheelchair Mobility    Modified Rankin (Stroke Patients Only)       Balance Overall  balance assessment: Needs assistance Sitting-balance support: Feet supported;No upper extremity supported Sitting balance-Leahy Scale: Fair Sitting balance - Comments: Pt with difficulty mobilizing hips back in chair.   Standing balance support: During functional activity;Bilateral upper extremity supported Standing balance-Leahy Scale: Poor Standing balance comment: Requires Min A for static standing balance and Mod A for SPT.                    Cognition Arousal/Alertness: Lethargic Behavior During Therapy: WFL for tasks assessed/performed Overall Cognitive Status: Impaired/Different from baseline Area of Impairment: Memory;Problem solving     Memory: Decreased short-term memory       Problem Solving: Slow processing;Difficulty sequencing;Requires verbal cues General Comments: Pt lethargic today. Very HOH. Requires repetition of cues to perform tasks. Delayed processing.     Exercises General Exercises - Lower Extremity Ankle Circles/Pumps: Both;10 reps;Seated    General Comments General comments (skin integrity, edema, etc.): lower BP today. BP 93/34 sitting in chair, BP 104/48 laying in bed.      Pertinent Vitals/Pain Pain Assessment: 0-10    Home Living                      Prior Function            PT Goals (current goals can now be found in the care plan section) Progress towards PT goals: Not progressing toward goals - comment (secondary to soft BP)    Frequency    Min 2X/week  PT Plan Frequency needs to be updated;Current plan remains appropriate    Co-evaluation             End of Session Equipment Utilized During Treatment: Gait belt;Oxygen Activity Tolerance: Treatment limited secondary to medical complications (Comment) (soft BP and dizziness) Patient left: in bed;with call bell/phone within reach;with bed alarm set     Time: 1357-1414 PT Time Calculation (min) (ACUTE ONLY): 17 min  Charges:  $Therapeutic  Activity: 8-22 mins                    G Codes:      [place] 10/27/2016, 3:13 PM Wray Kearns, [place], DPT [phone]

## 2016-10-27 NOTE — Progress Notes (Signed)
Progress Note   Patient Name: Erica Bridges Date of Encounter: 10/27/2016  Primary Cardiologist: [facility])  Subjective   Sitting up in the chair working on crossword. Somewhat lethargic, but says she did not rest well last night. Alert to self and surroundings.  Inpatient Medications    Scheduled Meds: .  stroke: mapping our early stages of recovery book   Does not apply Once  . apixaban  5 mg Oral BID  . aspirin EC  81 mg Oral Daily  . digoxin  0.125 mg Oral Daily  . furosemide  40 mg Intravenous BID  . guaiFENesin  600 mg Oral BID  . metoprolol tartrate  12.5 mg Oral BID  . potassium chloride  10 mEq Oral Daily  . pravastatin  40 mg Oral q1800   Continuous Infusions:  PRN Meds: acetaminophen **OR** acetaminophen (TYLENOL) oral liquid 160 mg/5 mL **OR** acetaminophen, albuterol, polyvinyl alcohol, senna-docusate   Vital Signs    Vitals:   10/26/16 2353 10/27/16 0412 10/27/16 0821 10/27/16 0822  BP: 107/64 (!) 106/58 110/66   Pulse:   99 99  Resp: (!) 21     Temp: 98.4 F (36.9 C) 98.4 F (36.9 C)    TempSrc: Oral Oral    SpO2: 94% 97%    Weight:  143 lb 14.4 oz (65.3 kg)    Height:        Intake/Output Summary (Last 24 hours) at 10/27/16 1115 Last data filed at 10/27/16 1000  Gross per 24 hour  Intake              980 ml  Output             1300 ml  Net             -320 ml   Filed Weights   10/25/16 0500 10/26/16 0547 10/27/16 0412  Weight: 166 lb 14.2 oz (75.7 kg) 166 lb 12.8 oz (75.7 kg) 143 lb 14.4 oz (65.3 kg)    Telemetry    Afib with generally adequate ventricular rate control. Rate 70-80s- Personally Reviewed  Physical Exam   GEN: Older WF, No acute distress. Wearing [place] Neck: + JVD (improved) Cardiac: Irregular, 3/6 holosystolic murmur heard throughout the anterior precordium, no murmurs, rubs, or gallops.  Respiratory: Clear to auscultation bilaterally. GI: Soft, nontender, non-distended   MS: 2+ chronic pitting edema; No  deformity. Neuro:  Alert to self and surroundings but lethargic  Psych: Normal affect   Labs    Chemistry Recent Labs Lab 10/21/16 0204  10/25/16 0244 10/26/16 0503 10/27/16 0512  NA 140  < > 138 138 137  K 4.8  < > 3.9 3.6 3.8  CL 103  < > 96* 97* 91*  CO2 27  < > 33* 34* 37*  GLUCOSE 89  < > 106* 100* 99  BUN 31*  < > 24* 20 21*  CREATININE 1.16*  < > 0.89 0.78 0.80  CALCIUM 9.5  < > 8.9 8.7* 8.6*  PROT 6.4*  --   --   --   --   ALBUMIN 3.3*  --   --   --   --   AST 25  --   --   --   --   ALT 16  --   --   --   --   ALKPHOS 76  --   --   --   --   BILITOT 0.7  --   --   --   --  GFRNONAA 41*  < > 57* >60 >60  GFRAA 48*  < > >60 >60 >60  ANIONGAP 10  < > 9 7 9   < > = values in this interval not displayed.   Hematology  Recent Labs Lab 10/21/16 0204 10/21/16 0237 10/24/16 0423  WBC 6.8  --  7.2  RBC 4.32  --  3.95  HGB 12.6 13.3 11.5*  HCT 40.3 39.0 37.0  MCV 93.3  --  93.7  MCH 29.2  --  29.1  MCHC 31.3  --  31.1  RDW 16.2*  --  15.7*  PLT 187  --  151    Cardiac EnzymesNo results for input(s): TROPONINI in the last 168 hours.   Recent Labs Lab 10/21/16 0235  TROPIPOC 0.02     BNP  Recent Labs Lab 10/22/16 0920  BNP 661.9*     DDimer No results for input(s): DDIMER in the last 168 hours.   Radiology    No results found.  Cardiac Studies   Echo 10/22/2016 - Left ventricle: The cavity size was normal. Wall thickness was normal. Systolic function was normal. The estimated ejection fraction was in the range of 55% to 60%. Wall motion was normal; there were no regional wall motion abnormalities. Doppler parameters are consistent with high ventricular filling pressure. - Ventricular septum: The contour showed diastolic flattening and systolic flattening. - Aortic valve: Valve mobility was restricted. - Mitral valve: Calcified annulus. There was mild regurgitation. Valve area by continuity equation (using LVOT flow): 1.14  cm^2. - Left atrium: The atrium was severely dilated. - Right ventricle: The cavity size was moderately dilated. Systolic function was moderately reduced. - Right atrium: The atrium was severely dilated. - Atrial septum: There was an atrial septal aneurysm. - Tricuspid valve: There was severe regurgitation. - Pulmonary arteries: Systolic pressure was moderately to severely increased. PA peak pressure: 62 mm Hg (S). - Pericardium, extracardiac: A trivial pericardial effusion was identified.  Impressions:  - Normal LV systolic function; elevated LV filling pressure; D shaped septum; biatrial enlargement; moderate RVE with moderately reduced RV function; mild MR; severe TR with moderate to severe elevation in pulmonary pressure.  Patient Profile     83 y.o. female with long-standing atrial fibrillation, acute on chronic diastolic heart failure and a large mural thrombus of the ascending aorta, presenting with a transient neurological event  Assessment & Plan    1. TIA/Aortic thrombus: Noted on CTA neck 7 x 7 x 20 mm mural thrombus. Seen by CVTS and deemed not a surgical candiate. -- On statin, her LDL is 62.   2. Permanent atrial fibrillation: She has been switched to Eliquis from coumadin given the finding of her thrombus. Switched from Coumadin to Eliquis 5mg  BID. Continued on ASA.  -- BB (atenolol) was stopped and started on Dig with rate somewhat controlled. Dig was reduced to 0.125mg  yesterday and low dose metoprolol 12.5mg  BID added back. Blood pressure has tolerated, HR now in the 70s-80s  3. Acute on chronic diastolic heart failure: Noted edema and severe JVD, but also has severe tricuspid regurgitation and moderate to severe pulmonary hypertension. Challenging to use her physical exam to assess volume status. -- remains on IV lasix 40mg  BID -- Weight is significantly down today 166>>143? Question accuracy of weights. Net +. Respiratory status remains about the  same. UOP 1.1L yesterday. Cr stable.  4. HL: Currently on statin  Signed, Reino Bellis, NP  10/27/2016, 11:15 AM      Patient seen  and examined. Agree with assessment and plan. I/O since admission +525, weights inaccurate. Still with 1 - 2+ pitting LE edema. Will f/u BNP. AF rate controlled 70 -80s. Cr 0.8.  CO2 increased from 27 to 37 suggestive of contraction alkalosis. Will add compression stockings.   Troy Sine, MD, [facility] 10/27/2016 11:48 AM

## 2016-10-27 NOTE — Progress Notes (Addendum)
PROGRESS NOTE    Erica Bridges  [id number] DOB: 01-Oct-1926 DOA: 10/21/2016 PCP: Ria Bush, MD    Brief Narrative:  83 yo WF PMHx HTN, chronic A. Fib on Coumadin,Cardiomegaly, Asthma, HLD,Malignant neoplasm of corpus uteri, except isthmus ,Polycythemia  Presented with new onset left hand numbness and slurred speech. By the initial physical examination she had recovered her function. Admitted for further workup.  2/1 am with resp distress, CXR with CHF  During this hospital course, patient was continued on IV lasix. Cardiology continues to follow.  Assessment & Plan:   Principal Problem:   TIA (transient ischemic attack) Active Problems:   HYPERCHOLESTEROLEMIA   Essential hypertension   Atrial fibrillation (HCC)   Medicare annual wellness visit, subsequent   Chronic anticoagulation   CHF (congestive heart failure) (HCC)   SOB (shortness of breath)   Acute respiratory distress   Thrombus of aorta (HCC)   Arterial hypotension   Acute on chronic diastolic CHF (congestive heart failure) (HCC)   Dyslipidemia   CKD (chronic kidney disease), stage III   Aortic thrombus (HCC)   Transient cerebral ischemia   TIA -Patient had presented with left hand numbness and slurred speech with rapid improvement with symptoms and a such TPA was not initiated.  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  -Carotid Doppler showed bilateral ICA 40-59% stenosis. 2 DL coordinate had a EF of 55-60% with no source of emboli. Hemoglobin A1c was 5.7. LDL of 59.  -CT angiogram neck was concerning for a sending aortic 7 x 7 x 20 mm mural thrombus with potential for embolism, severe stenosis left subclavian and left vertebral artery origins. Mild stenosis right vertebral artery origin.  -Neurology recommended patient be changed from Coumadin to eliquis for secondary stroke prevention.  - Remains stable at present  Acute on Chronic Diastolic  CHF -Patient noted to be significantly short of breath and noted to be an acute CHF exacerbation.  - Patient remains on 40mg  bid lasix -Wt today: 65.2kg - Wt on admit: 76.2kg - Cardiology following. LE remain edematous Recommendations for compression stockings - Labs reviewed. BNP 764.3 currently. Was 661.9 on 2/1 - Continue diuresis as tolerated  Chronic atrial fibrillation CHA2DS2VASC score >3 -Rate controlled -Home atenolol held by Cardiology secondary to soft BP -Will continue Digoxin per cardiology  HTN  - Overnight stable - Will continue current regimen  Dyslipidemia - LDL of 59.  - Will continue statin as tolerated  CKD stage III - Labs were reviewed. Cr remains stable - Will recheck bmet in AM  Ascending Aortic thrombus -Noted on angiogram of the neck.  -Patient has been seen in consultation by cardiothoracic surgery who feel that patient is not a candidate for open surgical repair. - Monitor for now  Hypomagnesemia - Mg of 1.4 today - Will replace  DVT prophylaxis: Eliquis Code Status: Full Family Communication: Pt in room, family at bedside Disposition Plan: Uncertain at this time  Consultants:   Cardiology  CT surgery  Procedures:     Antimicrobials: Anti-infectives    None      Subjective: Without complaints this AM  Objective: Vitals:   10/27/16 0412 10/27/16 0821 10/27/16 0822 10/27/16 1400  BP: (!) 106/58 110/66  (!) 103/48  Pulse:  99 99 83  Resp:    (!) 22  Temp: 98.4 F (36.9 C) 98.2 F (36.8 C)  98.4 F (36.9 C)  TempSrc: Oral Oral  Oral  SpO2: 97%  100%  Weight: 65.3 kg (143 lb 14.4 oz)     Height:        Intake/Output Summary (Last 24 hours) at 10/27/16 1618 Last data filed at 10/27/16 1300  Gross per 24 hour  Intake             1560 ml  Output             1850 ml  Net             -290 ml   Filed Weights   10/25/16 0500 10/26/16 0547 10/27/16 0412  Weight: 75.7 kg (166 lb 14.2 oz) 75.7 kg (166 lb 12.8 oz)  65.3 kg (143 lb 14.4 oz)    Examination:  General exam: asleep, easily arousable, in nad Respiratory system: normal resp effort, no wheezing on auscultation Cardiovascular system: regular rhythm, s1-2 on auscultation Gastrointestinal system: pos BS, nontender, nondistended Central nervous system: cn2-12 grossly intact, strength intact Extremities: no cyanosis, no joint deformities, 2+ pitting LE edema bilaterally Skin: no rashes, no pallor Psychiatry: affect normal// no auditory hallucinations   Data Reviewed: I have personally reviewed following labs and imaging studies  CBC:  Recent Labs Lab 10/21/16 0204 10/21/16 0237 10/24/16 0423  WBC 6.8  --  7.2  NEUTROABS 4.6  --   --   HGB 12.6 13.3 11.5*  HCT 40.3 39.0 37.0  MCV 93.3  --  93.7  PLT 187  --  [id number]   Basic Metabolic Panel:  Recent Labs Lab 10/23/16 0558 10/24/16 0423 10/25/16 0244 10/26/16 0503 10/27/16 0512  NA 138 139 138 138 137  K 4.3 4.1 3.9 3.6 3.8  CL 102 100* 96* 97* 91*  CO2 30 29 33* 34* 37*  GLUCOSE 89 84 106* 100* 99  BUN 25* 23* 24* 20 21*  CREATININE 1.05* 1.03* 0.89 0.78 0.80  CALCIUM 8.7* 8.5* 8.9 8.7* 8.6*  MG  --   --  1.6* 1.5* 1.4*   GFR: Estimated Creatinine Clearance: 51.1 mL/min (by C-G formula based on SCr of 0.8 mg/dL). Liver Function Tests:  Recent Labs Lab 10/21/16 0204  AST 25  ALT 16  ALKPHOS 76  BILITOT 0.7  PROT 6.4*  ALBUMIN 3.3*   No results for input(s): LIPASE, AMYLASE in the last 168 hours. No results for input(s): AMMONIA in the last 168 hours. Coagulation Profile:  Recent Labs Lab 10/22/16 0328 10/23/16 0558 10/24/16 0423 10/25/16 0244 10/26/16 0503  INR 2.50 2.55 2.51 1.66 1.79   Cardiac Enzymes: No results for input(s): CKTOTAL, CKMB, CKMBINDEX, TROPONINI in the last 168 hours. BNP (last 3 results) No results for input(s): PROBNP in the last 8760 hours. HbA1C: No results for input(s): HGBA1C in the last 72 hours. CBG:  Recent Labs Lab  10/21/16 0200 10/24/16 0103  GLUCAP 78 100*   Lipid Profile: No results for input(s): CHOL, HDL, LDLCALC, TRIG, CHOLHDL, LDLDIRECT in the last 72 hours. Thyroid Function Tests: No results for input(s): TSH, T4TOTAL, FREET4, T3FREE, THYROIDAB in the last 72 hours. Anemia Panel: No results for input(s): VITAMINB12, FOLATE, FERRITIN, TIBC, IRON, RETICCTPCT in the last 72 hours. Sepsis Labs: No results for input(s): PROCALCITON, LATICACIDVEN in the last 168 hours.  No results found for this or any previous visit (from the past 240 hour(s)).   Radiology Studies: No results found.  Scheduled Meds: .  stroke: mapping our early stages of recovery book   Does not apply Once  . apixaban  5 mg Oral BID  . aspirin  EC  81 mg Oral Daily  . digoxin  0.125 mg Oral Daily  . furosemide  40 mg Intravenous BID  . guaiFENesin  600 mg Oral BID  . magnesium sulfate 1 - 4 g bolus IVPB  4 g Intravenous Once  . metoprolol tartrate  12.5 mg Oral BID  . potassium chloride  10 mEq Oral Daily  . pravastatin  40 mg Oral q1800   Continuous Infusions:   LOS: 5 days   Erica Bridges, Orpah Melter, MD [facility] Pager [phone]  If 7PM-7AM, please contact night-coverage [email or web address] Password [email or web address] 10/27/2016, 4:18 PM

## 2016-10-28 ENCOUNTER — Telehealth: Payer: Self-pay

## 2016-10-28 DIAGNOSIS — G454 Transient global amnesia: Secondary | ICD-10-CM

## 2016-10-28 LAB — BASIC METABOLIC PANEL
ANION GAP: 9 (ref 5–15)
BUN: 18 mg/dL (ref 6–20)
CO2: 41 mmol/L — AB (ref 22–32)
Calcium: 8.5 mg/dL — ABNORMAL LOW (ref 8.9–10.3)
Chloride: 88 mmol/L — ABNORMAL LOW (ref 101–111)
Creatinine, Ser: 0.91 mg/dL (ref 0.44–1.00)
GFR calc Af Amer: >60 mL/min (ref >60)
GFR, EST NON AFRICAN AMERICAN: 55 mL/min — AB (ref >60)
GLUCOSE: 88 mg/dL (ref 65–99)
POTASSIUM: 3.5 mmol/L (ref 3.5–5.1)
Sodium: 138 mmol/L (ref 135–145)

## 2016-10-28 LAB — MAGNESIUM: MAGNESIUM: 1.3 mg/dL — AB (ref 1.7–2.4)

## 2016-10-28 MED ORDER — MAGNESIUM OXIDE 400 (241.3 MG) MG PO TABS
800.0000 mg | ORAL_TABLET | Freq: Two times a day (BID) | ORAL | Status: DC
  Administered 2016-10-29: 800 mg via ORAL
  Filled 2016-10-28: qty 2

## 2016-10-28 MED ORDER — MAGNESIUM SULFATE 50 % IJ SOLN
3.0000 g | Freq: Once | INTRAVENOUS | Status: AC
  Administered 2016-10-28: 3 g via INTRAVENOUS
  Filled 2016-10-28: qty 6

## 2016-10-28 MED ORDER — FUROSEMIDE 40 MG PO TABS
40.0000 mg | ORAL_TABLET | Freq: Two times a day (BID) | ORAL | Status: DC
  Administered 2016-10-28 – 2016-10-29 (×2): 40 mg via ORAL
  Filled 2016-10-28 (×2): qty 1

## 2016-10-28 NOTE — Telephone Encounter (Signed)
FYI:  In reaching out to reschedule patient's missed INR check on 10/27/16, it has been discovered that patient is currently in the hospital.  She was admitted on 10/21/16 for TIA, SOB and acute CHF exacerbation.  Will follow up with patient after discharge.

## 2016-10-28 NOTE — Progress Notes (Signed)
PROGRESS NOTE    Erica Bridges  [id number] DOB: [birth year] DOA: 10/21/2016 PCP: Ria Bush, MD    Brief Narrative:  83 yo WF PMHx HTN, chronic A. Fib on Coumadin,Cardiomegaly, Asthma, HLD,Malignant neoplasm of corpus uteri, except isthmus ,Polycythemia  Presented with new onset left hand numbness and slurred speech. By the initial physical examination she had recovered her function. Admitted for further workup.  2/1 am with resp distress, CXR with CHF  During this hospital course, patient was continued on IV lasix. Cardiology continues to follow.  Assessment & Plan:   Principal Problem:   TIA (transient ischemic attack) Active Problems:   HYPERCHOLESTEROLEMIA   Essential hypertension   Atrial fibrillation (HCC)   Medicare annual wellness visit, subsequent   Chronic anticoagulation   CHF (congestive heart failure) (HCC)   SOB (shortness of breath)   Acute respiratory distress   Thrombus of aorta (HCC)   Arterial hypotension   Acute on chronic diastolic CHF (congestive heart failure) (HCC)   Dyslipidemia   CKD (chronic kidney disease), stage III   Aortic thrombus (HCC)   Transient cerebral ischemia   TIA/Aortic thrombus: Noted on CTA neck 7 x 7 x 20 mm mural thrombuswith potential for embolism, severe stenosis left subclavian and left vertebral artery origins . Seen by CVTS and deemed not a surgical candiate. -Patient had presented with left hand numbness and slurred speech with rapid improvement with symptoms and a such TPA was not initiated.  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  -Carotid Doppler showed bilateral ICA 40-59% stenosis. 2 DL coordinate had a EF of 55-60% with no source of emboli. Hemoglobin A1c was 5.7. LDL of 59.   -Neurology recommended patient be changed from Coumadin to eliquis for secondary stroke prevention.  - Remains stable at present Pt will follow up with Dr. Erlinda Hong at [facility] in about 6  weeks   Acute on Chronic Diastolic CHF -Patient noted to be significantly short of breath and noted to be an acute CHF exacerbation. Noted edema and severe JVD, but also has severe tricuspid regurgitation and moderate to severe pulmonary hypertension.  - Patient remains on 40mg  bid lasix IV , transition Lasix to by mouth -Wt today: 158 lbs  - Wt on admit:168 lbs  - Cardiology following. LE remain edematous Recommendations for compression stockings - Labs reviewed. BNP 764.3 currently. Was 661.9 on 2/1 - Continue diuresis as tolerated  Chronic atrial fibrillation CHA2DS2VASC score >3 She has been switched to Eliquis from coumadin given the finding of her thrombus. Switched from Coumadin to Eliquis 5mg  BID. Continued on ASA -Home atenolol held by Cardiology secondary to soft BP Dig was reduced to 0.125mg    and low dose metoprolol 12.5mg  BID added back. Blood pressure has tolerated, HR now in the 70s-80s  HTN  - Overnight stable - Will continue current regimen  Dyslipidemia - LDL of 59.  - Will continue statin as tolerated  CKD stage III - Labs were reviewed. Cr remains stable - Will recheck bmet in AM  Ascending Aortic thrombus -Noted on angiogram of the neck.  -Patient has been seen in consultation by cardiothoracic surgery who feel that patient is not a candidate for open surgical repair. - Monitor for now  Hypomagnesemia - Mg of 1.3 today - Will replace  DVT prophylaxis: Eliquis Code Status: Full Family Communication: Pt in room, family at bedside Disposition Plan:  2-3 days, SNF?  Consultants:   Cardiology  CT  surgery  Procedures:     Antimicrobials: Anti-infectives    None      Subjective: Denies any chest pain shortness of breath, blood pressure soft   Objective: Vitals:   10/27/16 2300 10/27/16 2306 10/28/16 0428 10/28/16 0500  BP: (!) 92/58 (!) 107/59 (!) 98/46   Pulse: 97 96 83   Resp: 19  18   Temp:   97.8 F (36.6 C)   TempSrc:    Oral   SpO2: 98%  99%   Weight:    71.8 kg (158 lb 4.6 oz)  Height:        Intake/Output Summary (Last 24 hours) at 10/28/16 0811 Last data filed at 10/28/16 0600  Gross per 24 hour  Intake             1200 ml  Output             1600 ml  Net             -400 ml   Filed Weights   10/26/16 0547 10/27/16 0412 10/28/16 0500  Weight: 75.7 kg (166 lb 12.8 oz) 65.3 kg (143 lb 14.4 oz) 71.8 kg (158 lb 4.6 oz)    Examination:  General exam: asleep, easily arousable, in nad Respiratory system: normal resp effort, no wheezing on auscultation Cardiovascular system: regular rhythm, s1-2 on auscultation Gastrointestinal system: pos BS, nontender, nondistended Central nervous system: cn2-12 grossly intact, strength intact Extremities: no cyanosis, no joint deformities, 2+ pitting LE edema bilaterally Skin: no rashes, no pallor Psychiatry: affect normal// no auditory hallucinations   Data Reviewed: I have personally reviewed following labs and imaging studies  CBC:  Recent Labs Lab 10/24/16 0423  WBC 7.2  HGB 11.5*  HCT 37.0  MCV 93.7  PLT 123XX123   Basic Metabolic Panel:  Recent Labs Lab 10/24/16 0423 10/25/16 0244 10/26/16 0503 10/27/16 0512 10/28/16 0520  NA 139 138 138 137 138  K 4.1 3.9 3.6 3.8 3.5  CL 100* 96* 97* 91* 88*  CO2 29 33* 34* 37* 41*  GLUCOSE 84 106* 100* 99 88  BUN 23* 24* 20 21* 18  CREATININE 1.03* 0.89 0.78 0.80 0.91  CALCIUM 8.5* 8.9 8.7* 8.6* 8.5*  MG  --  1.6* 1.5* 1.4* 1.3*   GFR: Estimated Creatinine Clearance: 45.5 mL/min (by C-G formula based on SCr of 0.91 mg/dL). Liver Function Tests: No results for input(s): AST, ALT, ALKPHOS, BILITOT, PROT, ALBUMIN in the last 168 hours. No results for input(s): LIPASE, AMYLASE in the last 168 hours. No results for input(s): AMMONIA in the last 168 hours. Coagulation Profile:  Recent Labs Lab 10/22/16 0328 10/23/16 0558 10/24/16 0423 10/25/16 0244 10/26/16 0503  INR 2.50 2.55 2.51 1.66 1.79    Cardiac Enzymes: No results for input(s): CKTOTAL, CKMB, CKMBINDEX, TROPONINI in the last 168 hours. BNP (last 3 results) No results for input(s): PROBNP in the last 8760 hours. HbA1C: No results for input(s): HGBA1C in the last 72 hours. CBG:  Recent Labs Lab 10/24/16 0103  GLUCAP 100*   Lipid Profile: No results for input(s): CHOL, HDL, LDLCALC, TRIG, CHOLHDL, LDLDIRECT in the last 72 hours. Thyroid Function Tests: No results for input(s): TSH, T4TOTAL, FREET4, T3FREE, THYROIDAB in the last 72 hours. Anemia Panel: No results for input(s): VITAMINB12, FOLATE, FERRITIN, TIBC, IRON, RETICCTPCT in the last 72 hours. Sepsis Labs: No results for input(s): PROCALCITON, LATICACIDVEN in the last 168 hours.  No results found for this or any previous visit (from the past  240 hour(s)).   Radiology Studies: No results found.  Scheduled Meds: .  stroke: mapping our early stages of recovery book   Does not apply Once  . apixaban  5 mg Oral BID  . aspirin EC  81 mg Oral Daily  . digoxin  0.125 mg Oral Daily  . furosemide  40 mg Intravenous BID  . guaiFENesin  600 mg Oral BID  . metoprolol tartrate  12.5 mg Oral BID  . potassium chloride  10 mEq Oral Daily  . pravastatin  40 mg Oral q1800   Continuous Infusions:   LOS: 6 days   Reyne Dumas, MD [facility] Pager [phone]  If 7PM-7AM, please contact night-coverage [email or web address] Password [email or web address] 10/28/2016, 8:11 AM

## 2016-10-28 NOTE — Progress Notes (Signed)
Progress Note   Patient Name: Erica Bridges Date of Encounter: 10/28/2016  Primary Cardiologist: [facility])  Subjective   More alert and interactive this morning. Breathing is stable. Having a good morning.  Inpatient Medications    Scheduled Meds: .  stroke: mapping our early stages of recovery book   Does not apply Once  . apixaban  5 mg Oral BID  . aspirin EC  81 mg Oral Daily  . digoxin  0.125 mg Oral Daily  . furosemide  40 mg Intravenous BID  . guaiFENesin  600 mg Oral BID  . [START ON 10/29/2016] magnesium oxide  800 mg Oral BID  . magnesium sulfate 1 - 4 g bolus IVPB  3 g Intravenous Once  . metoprolol tartrate  12.5 mg Oral BID  . potassium chloride  10 mEq Oral Daily  . pravastatin  40 mg Oral q1800   Continuous Infusions:  PRN Meds: acetaminophen **OR** acetaminophen (TYLENOL) oral liquid 160 mg/5 mL **OR** acetaminophen, albuterol, polyvinyl alcohol, senna-docusate   Vital Signs    Vitals:   10/27/16 2306 10/28/16 0428 10/28/16 0500 10/28/16 0848  BP: (!) 107/59 (!) 98/46    Pulse: 96 83  99  Resp:  18    Temp:  97.8 F (36.6 C)    TempSrc:  Oral    SpO2:  99%    Weight:   158 lb 4.6 oz (71.8 kg)   Height:        Intake/Output Summary (Last 24 hours) at 10/28/16 1015 Last data filed at 10/28/16 0600  Gross per 24 hour  Intake              600 ml  Output             1100 ml  Net             -500 ml   Filed Weights   10/26/16 0547 10/27/16 0412 10/28/16 0500  Weight: 166 lb 12.8 oz (75.7 kg) 143 lb 14.4 oz (65.3 kg) 158 lb 4.6 oz (71.8 kg)    Telemetry    Afib with generally adequate ventricular rate control. PVCs, Rate 70-80s- Personally Reviewed  Physical Exam   GEN: Older WF, No acute distress. Wearing [place] Neck: + JVD (improved) Cardiac: Irregular, 3/6 holosystolic murmur heard throughout the anterior precordium, no murmurs, rubs, or gallops.  Respiratory: Clear to auscultation bilaterally. GI: Soft, nontender, non-distended   MS: Trace  RLE edema, 1+ LLE edema (much improved); No deformity. Neuro:  Alert to self and surroundings, more interactive today. Psych: Normal affect   Labs    Chemistry  Recent Labs Lab 10/26/16 0503 10/27/16 0512 10/28/16 0520  NA 138 137 138  K 3.6 3.8 3.5  CL 97* 91* 88*  CO2 34* 37* 41*  GLUCOSE 100* 99 88  BUN 20 21* 18  CREATININE 0.78 0.80 0.91  CALCIUM 8.7* 8.6* 8.5*  GFRNONAA >60 >60 55*  GFRAA >60 >60 >60  ANIONGAP 7 9 9      Hematology  Recent Labs Lab 10/24/16 0423  WBC 7.2  RBC 3.95  HGB 11.5*  HCT 37.0  MCV 93.7  MCH 29.1  MCHC 31.1  RDW 15.7*  PLT 151    Cardiac EnzymesNo results for input(s): TROPONINI in the last 168 hours.  No results for input(s): TROPIPOC in the last 168 hours.   BNP  Recent Labs Lab 10/22/16 0920 10/27/16 1410  BNP 661.9* 764.3*     DDimer No results  for input(s): DDIMER in the last 168 hours.   Radiology    No results found.  Cardiac Studies   Echo 10/22/2016 - Left ventricle: The cavity size was normal. Wall thickness was normal. Systolic function was normal. The estimated ejection fraction was in the range of 55% to 60%. Wall motion was normal; there were no regional wall motion abnormalities. Doppler parameters are consistent with high ventricular filling pressure. - Ventricular septum: The contour showed diastolic flattening and systolic flattening. - Aortic valve: Valve mobility was restricted. - Mitral valve: Calcified annulus. There was mild regurgitation. Valve area by continuity equation (using LVOT flow): 1.14 cm^2. - Left atrium: The atrium was severely dilated. - Right ventricle: The cavity size was moderately dilated. Systolic function was moderately reduced. - Right atrium: The atrium was severely dilated. - Atrial septum: There was an atrial septal aneurysm. - Tricuspid valve: There was severe regurgitation. - Pulmonary arteries: Systolic pressure was moderately to  severely increased. PA peak pressure: 62 mm Hg (S). - Pericardium, extracardiac: A trivial pericardial effusion was identified.  Impressions:  - Normal LV systolic function; elevated LV filling pressure; D shaped septum; biatrial enlargement; moderate RVE with moderately reduced RV function; mild MR; severe TR with moderate to severe elevation in pulmonary pressure.  Patient Profile     83 y.o. female with long-standing atrial fibrillation, acute on chronic diastolic heart failure and a large mural thrombus of the ascending aorta, presenting with a transient neurological event.   Assessment & Plan    1. TIA/Aortic thrombus: Noted on CTA neck 7 x 7 x 20 mm mural thrombus. Seen by CVTS and deemed not a surgical candiate. -- On statin, her LDL is 62.   2. Permanent atrial fibrillation: She has been switched to Eliquis from coumadin given the finding of her thrombus. Switched from Coumadin to Eliquis 5mg  BID. Continued on ASA.  -- BB (atenolol) was stopped and started on Dig with rate somewhat controlled. Dig was reduced to 0.125mg  and low dose metoprolol 12.5mg  BID added back. Blood pressure has tolerated, HR maintained.   3. Acute on chronic diastolic heart failure: LE edema has much improve today, JVD decreased. -- Has severe tricuspid regurgitation and moderate to severe pulmonary hypertension. Challenging to use her physical exam to assess volume status. -- remains on IV lasix 40mg  BID, but noted concern for contraction alkalosis with elevated CO2. Would transition to oral lasix today.  -- Weights are inaccurate, Net + still but had good UOP yesterday with 1.6L.  4. HL: Currently on statin  Signed, Reino Bellis, NP  10/28/2016, 10:15 AM    Patient seen and examined. Agree with assessment and plan. More alert today. Edema improved. BNP 764 yesterday. Continue lasix but transition to oral therapy today. Discussed with daughter. AF rate controlled in the 70s.     Troy Sine, MD, [facility] 10/28/2016 10:54 AM

## 2016-10-29 DIAGNOSIS — G459 Transient cerebral ischemic attack, unspecified: Secondary | ICD-10-CM | POA: Diagnosis not present

## 2016-10-29 DIAGNOSIS — R600 Localized edema: Secondary | ICD-10-CM | POA: Diagnosis not present

## 2016-10-29 DIAGNOSIS — M6281 Muscle weakness (generalized): Secondary | ICD-10-CM | POA: Diagnosis not present

## 2016-10-29 DIAGNOSIS — I5033 Acute on chronic diastolic (congestive) heart failure: Secondary | ICD-10-CM | POA: Diagnosis not present

## 2016-10-29 DIAGNOSIS — R41841 Cognitive communication deficit: Secondary | ICD-10-CM | POA: Diagnosis not present

## 2016-10-29 DIAGNOSIS — J449 Chronic obstructive pulmonary disease, unspecified: Secondary | ICD-10-CM | POA: Diagnosis not present

## 2016-10-29 DIAGNOSIS — R0603 Acute respiratory distress: Secondary | ICD-10-CM | POA: Diagnosis not present

## 2016-10-29 DIAGNOSIS — R531 Weakness: Secondary | ICD-10-CM | POA: Diagnosis not present

## 2016-10-29 DIAGNOSIS — I2789 Other specified pulmonary heart diseases: Secondary | ICD-10-CM | POA: Diagnosis not present

## 2016-10-29 DIAGNOSIS — I368 Other nonrheumatic tricuspid valve disorders: Secondary | ICD-10-CM | POA: Diagnosis not present

## 2016-10-29 DIAGNOSIS — I480 Paroxysmal atrial fibrillation: Secondary | ICD-10-CM | POA: Diagnosis not present

## 2016-10-29 DIAGNOSIS — G458 Other transient cerebral ischemic attacks and related syndromes: Secondary | ICD-10-CM | POA: Diagnosis not present

## 2016-10-29 DIAGNOSIS — N183 Chronic kidney disease, stage 3 (moderate): Secondary | ICD-10-CM | POA: Diagnosis not present

## 2016-10-29 DIAGNOSIS — I10 Essential (primary) hypertension: Secondary | ICD-10-CM | POA: Diagnosis not present

## 2016-10-29 DIAGNOSIS — K625 Hemorrhage of anus and rectum: Secondary | ICD-10-CM | POA: Diagnosis not present

## 2016-10-29 DIAGNOSIS — R2689 Other abnormalities of gait and mobility: Secondary | ICD-10-CM | POA: Diagnosis not present

## 2016-10-29 DIAGNOSIS — Z7901 Long term (current) use of anticoagulants: Secondary | ICD-10-CM | POA: Diagnosis not present

## 2016-10-29 DIAGNOSIS — I4891 Unspecified atrial fibrillation: Secondary | ICD-10-CM | POA: Diagnosis not present

## 2016-10-29 DIAGNOSIS — R278 Other lack of coordination: Secondary | ICD-10-CM | POA: Diagnosis not present

## 2016-10-29 DIAGNOSIS — G454 Transient global amnesia: Secondary | ICD-10-CM | POA: Diagnosis not present

## 2016-10-29 DIAGNOSIS — N390 Urinary tract infection, site not specified: Secondary | ICD-10-CM | POA: Diagnosis not present

## 2016-10-29 DIAGNOSIS — E7800 Pure hypercholesterolemia, unspecified: Secondary | ICD-10-CM | POA: Diagnosis not present

## 2016-10-29 LAB — URINE CULTURE: Culture: >=100000 — AB

## 2016-10-29 LAB — CBC
HCT: 32.8 % — ABNORMAL LOW (ref 36.0–46.0)
HEMOGLOBIN: 10 g/dL — AB (ref 12.0–15.0)
MCH: 28.2 pg (ref 26.0–34.0)
MCHC: 30.5 g/dL (ref 30.0–36.0)
MCV: 92.4 fL (ref 78.0–100.0)
Platelets: 158 10*3/uL (ref 150–400)
RBC: 3.55 MIL/uL — ABNORMAL LOW (ref 3.87–5.11)
RDW: 14.3 % (ref 11.5–15.5)
WBC: 7.8 10*3/uL (ref 4.0–10.5)

## 2016-10-29 LAB — MAGNESIUM: MAGNESIUM: 1.7 mg/dL (ref 1.7–2.4)

## 2016-10-29 MED ORDER — MAGNESIUM OXIDE 400 (241.3 MG) MG PO TABS: 800.0000 mg | ORAL_TABLET | Freq: Two times a day (BID) | ORAL | 2 refills | Status: AC

## 2016-10-29 MED ORDER — CIPROFLOXACIN HCL 500 MG PO TABS
500.0000 mg | ORAL_TABLET | Freq: Two times a day (BID) | ORAL | Status: DC
  Administered 2016-10-29: 500 mg via ORAL
  Filled 2016-10-29: qty 1

## 2016-10-29 MED ORDER — DIGOXIN 125 MCG PO TABS: 0.1250 mg | ORAL_TABLET | Freq: Every day | ORAL | 2 refills | Status: DC

## 2016-10-29 MED ORDER — FUROSEMIDE 40 MG PO TABS: 40.0000 mg | ORAL_TABLET | Freq: Two times a day (BID) | ORAL | 1 refills | Status: DC

## 2016-10-29 MED ORDER — METOPROLOL TARTRATE 25 MG PO TABS: 12.5000 mg | ORAL_TABLET | Freq: Two times a day (BID) | ORAL | 3 refills | Status: DC

## 2016-10-29 MED ORDER — ASPIRIN 81 MG PO TBEC: 81.0000 mg | DELAYED_RELEASE_TABLET | Freq: Every day | ORAL | 1 refills | Status: DC

## 2016-10-29 MED ORDER — CIPROFLOXACIN HCL 500 MG PO TABS: 500.0000 mg | ORAL_TABLET | Freq: Two times a day (BID) | ORAL | 0 refills | Status: AC

## 2016-10-29 MED ORDER — APIXABAN 5 MG PO TABS: 5.0000 mg | ORAL_TABLET | Freq: Two times a day (BID) | ORAL | 3 refills | Status: DC

## 2016-10-29 MED ORDER — GUAIFENESIN ER 600 MG PO TB12: 600.0000 mg | ORAL_TABLET | Freq: Two times a day (BID) | ORAL | 0 refills | Status: AC

## 2016-10-29 NOTE — Progress Notes (Signed)
NURSING PROGRESS NOTE  Erica Bridges [id number] Discharge Data: 10/29/2016 2:43 PM Attending Provider: No att. providers found [id number] Danise Mina, MD     [name] to be D/C'd Skilled nursing facility per MD order.  Discussed with the patient the After Visit Summary and all questions fully answered. All IV's discontinued with no bleeding noted. All belongings returned to patient for patient to take home. Called nurse at facility and gave report on pt. Pt taken via PTAR to facility.  Last Vital Signs:  Blood pressure (!) 107/51, pulse 85, temperature 97.8 F (36.6 C), temperature source Oral, resp. rate 17, height 5\' 9"  (1.753 m), weight 65.2 kg (143 lb 12.8 oz), SpO2 96 %.  Discharge Medication List Allergies as of 10/29/2016      Reactions   Amoxicillin    REACTION: rashj   Penicillins    Has patient had a PCN reaction causing immediate rash, facial/tongue/throat swelling, SOB or lightheadedness with hypotension: no Has patient had a PCN reaction causing severe rash involving mucus membranes or skin necrosis: no Has patient had a PCN reaction that required hospitalization no Has patient had a PCN reaction occurring within the last 10 years: no If all of the above answers are "NO", then may proceed with Cephalosporin use.      Medication List    STOP taking these medications   atenolol 50 MG tablet Commonly known as:  TENORMIN   predniSONE 5 MG tablet Commonly known as:  DELTASONE   triamterene-hydrochlorothiazide 37.5-25 MG tablet Commonly known as:  MAXZIDE-25   warfarin 5 MG tablet Commonly known as:  COUMADIN     TAKE these medications   albuterol 108 (90 Base) MCG/ACT inhaler Commonly known as:  PROVENTIL HFA;VENTOLIN HFA Inhale 2 puffs into the lungs every 6 (six) hours as needed for wheezing or shortness of breath.   apixaban 5 MG Tabs tablet Commonly known as:  ELIQUIS Take 1 tablet (5 mg total) by mouth 2 (two) times daily.   aspirin 81 MG EC tablet Take 1  tablet (81 mg total) by mouth daily. Start taking on:  10/30/2016   BIOTIN PO Take 1 tablet by mouth daily. Reported on 01/17/2016   CALCIUM 1200+D3 PO Take 1 tablet by mouth daily.   cholecalciferol 1000 units tablet Commonly known as:  VITAMIN D Take 1,000 Units by mouth daily.   ciprofloxacin 500 MG tablet Commonly known as:  CIPRO Take 1 tablet (500 mg total) by mouth 2 (two) times daily.   Coenzyme Q10 200 MG capsule Take 200 mg by mouth daily.   digoxin 0.125 MG tablet Commonly known as:  LANOXIN Take 1 tablet (0.125 mg total) by mouth daily. Start taking on:  10/30/2016   furosemide 40 MG tablet Commonly known as:  LASIX Take 1 tablet (40 mg total) by mouth 2 (two) times daily.   guaiFENesin 600 MG 12 hr tablet Commonly known as:  MUCINEX Take 1 tablet (600 mg total) by mouth 2 (two) times daily.   magnesium oxide 400 (241.3 Mg) MG tablet Commonly known as:  MAG-OX Take 2 tablets (800 mg total) by mouth 2 (two) times daily.   metoprolol tartrate 25 MG tablet Commonly known as:  LOPRESSOR Take 0.5 tablets (12.5 mg total) by mouth 2 (two) times daily.   potassium chloride 10 MEQ tablet Commonly known as:  K-DUR,KLOR-CON Take 1 tablet (10 mEq total) by mouth daily.   pravastatin 40 MG tablet Commonly known as:  PRAVACHOL Take 1 tablet (40  mg total) by mouth daily.   SYSTANE 0.4-0.3 % Soln Generic drug:  Polyethyl Glycol-Propyl Glycol Apply 1 drop to eye 4 (four) times daily as needed.

## 2016-10-29 NOTE — Clinical Social Work Placement (Signed)
   CLINICAL SOCIAL WORK PLACEMENT  NOTE  Date:  10/29/2016  Patient Details  Name: SHARNICE KLAUCK MRN: [id number] Date of Birth: [birth year]  Clinical Social Work is seeking post-discharge placement for this patient at the Portal level of care (*CSW will initial, date and re-position this form in  chart as items are completed):  Yes   Patient/family provided with [facility] Work Department's list of facilities offering this level of care within the geographic area requested by the patient (or if unable, by the patient's family).  Yes   Patient/family informed of their freedom to choose among providers that offer the needed level of care, that participate in Medicare, Medicaid or managed care program needed by the patient, have an available bed and are willing to accept the patient.  Yes   Patient/family informed of [facility]'s ownership interest in [facility] and [facility], as well as of the fact that they are under no obligation to receive care at these facilities.  PASRR submitted to EDS on 10/25/16     PASRR number received on 10/25/16     Existing PASRR number confirmed on       FL2 transmitted to all facilities in geographic area requested by pt/family on 10/25/16     FL2 transmitted to all facilities within larger geographic area on       Patient informed that his/her managed care company has contracts with or will negotiate with certain facilities, including the following:        Yes   Patient/family informed of bed offers received.  Patient chooses bed at [place], [place]     Physician recommends and patient chooses bed at      Patient to be transferred to [place], [place] on 10/29/16.  Patient to be transferred to facility by Ambulance     Patient family notified on 10/29/16 of transfer.  Name of family member notified:  Joelene Millin at bedside     PHYSICIAN Please prepare priority discharge summary, including  medications, Please prepare prescriptions, Please sign FL2     Additional Comment:    _______________________________________________ Rigoberto Noel, LCSW 10/29/2016, 1:42 PM

## 2016-10-29 NOTE — Progress Notes (Signed)
Occupational Therapy Treatment Patient Details Name: Erica Bridges MRN: [id number] DOB: Dec 04, 1926 Today's Date: 10/29/2016    History of present illness 83 y.o.femalewith medical history significant of HTN, HLD, afib, on chronic anticoagulation, presumed COPD; who presents with complaints of left hand weakness. MRI on 1/31 negative for acute findings. Ct angiogram- abdominal aortic thrombus. CXR- CHF   OT comments  Pt making progress with functional goals. OT will continue to follow  Follow Up Recommendations  SNF;Supervision/Assistance - 24 hour    Equipment Recommendations  None recommended by OT    Recommendations for Other Services      Precautions / Restrictions Precautions Precautions: Fall Precaution Comments: watch BP, 02 sats Restrictions Weight Bearing Restrictions: No       Mobility Bed Mobility               General bed mobility comments: pt up in recliner  Transfers Overall transfer level: Needs assistance Equipment used: 1 person hand held assist;Rolling walker (2 wheeled) Transfers: Sit to/from Stand Sit to Stand: Min assist         General transfer comment: Assist to boost from recliner with cues for hand placement/technique    Balance Overall balance assessment: Needs assistance Sitting-balance support: Feet supported;No upper extremity supported Sitting balance-Leahy Scale: Fair     Standing balance support: During functional activity;Bilateral upper extremity supported;Single extremity supported Standing balance-Leahy Scale: Poor                     ADL       Grooming: Standing;Minimal assistance;Wash/dry hands;Wash/dry face;Min guard   Upper Body Bathing: Set up;Sitting (simulated)   Lower Body Bathing: Min guard;Sit to/from stand (simulated)           Toilet Transfer: Ambulation;RW;Minimal assistance;BSC   Toileting- Water quality scientist and Hygiene: Min guard;Sit to/from stand       Functional mobility  during ADLs: Minimal assistance;Rolling walker General ADL Comments: O2 SATs remained >88%                                      Cognition   Behavior During Therapy: WFL for tasks assessed/performed Overall Cognitive Status: Within Functional Limits for tasks assessed                       Extremity/Trunk Assessment   generalized weakness                        General Comments  pt very pleasant and cooperative    Pertinent Vitals/ Pain       Pain Assessment: 0-10 Pain Score: 5  Pain Location: L hip with mobility Pain Descriptors / Indicators: Aching;Sore Pain Intervention(s): Monitored during session;Repositioned                                                          Frequency  Min 2X/week        Progress Toward Goals  OT Goals(current goals can now be found in the care plan section)  Progress towards OT goals: Progressing toward goals  Acute Rehab OT Goals Patient Stated Goal: rehab then home OT Goal Formulation: With patient/family  Plan Discharge plan remains  appropriate                     End of Session Equipment Utilized During Treatment: Gait belt;Rolling walker;Oxygen;Other (comment) [facility])   Activity Tolerance Patient tolerated treatment well   Patient Left with call bell/phone within reach;in chair;with chair alarm set;with family/visitor present        Time: [id number] OT Time Calculation (min): 14 min  Charges: OT General Charges $OT Visit: 1 Procedure OT Treatments $Self Care/Home Management : 8-22 mins  Britt Bottom 10/29/2016, 1:17 PM

## 2016-10-29 NOTE — Discharge Summary (Addendum)
Physician Discharge Summary  Erica Bridges MRN: [id number] DOB/AGE: [birth year] 81 y.o.  PCP: Erica Bush, MD   Admit date: 10/21/2016 Discharge date: 10/29/2016  Discharge Diagnoses:    Principal Problem:   TIA (transient ischemic attack) Active Problems:   HYPERCHOLESTEROLEMIA   Essential hypertension   Atrial fibrillation ([place])   Medicare annual wellness visit, subsequent   Chronic anticoagulation   CHF (congestive heart failure) (HCC)   SOB (shortness of breath)   Acute respiratory distress   Thrombus of aorta (HCC)   Arterial hypotension   Acute on chronic diastolic CHF (congestive heart failure) (HCC)   Dyslipidemia   CKD (chronic kidney disease), stage III   Aortic thrombus (HCC)   Transient cerebral ischemia    Follow-up recommendations Follow-up with PCP in 3-5 days , including all  additional recommended appointments as below Follow-up CBC, CMP, magnesium in 3-5 days Patient to follow-up with Dr. Claiborne Billings with cardiology as needed Patient to follow-up with neurology Dr. Angelia Mould oxygen to keep sao2>92%      Current Discharge Medication List    START taking these medications   Details  apixaban (ELIQUIS) 5 MG TABS tablet Take 1 tablet (5 mg total) by mouth 2 (two) times daily. Qty: 60 tablet, Refills: 3    aspirin EC 81 MG EC tablet Take 1 tablet (81 mg total) by mouth daily. Qty: 30 tablet, Refills: 1    ciprofloxacin (CIPRO) 500 MG tablet Take 1 tablet (500 mg total) by mouth 2 (two) times daily. Qty: 10 tablet, Refills: 0    digoxin (LANOXIN) 0.125 MG tablet Take 1 tablet (0.125 mg total) by mouth daily. Qty: 30 tablet, Refills: 2    furosemide (LASIX) 40 MG tablet Take 1 tablet (40 mg total) by mouth 2 (two) times daily. Qty: 60 tablet, Refills: 1    guaiFENesin (MUCINEX) 600 MG 12 hr tablet Take 1 tablet (600 mg total) by mouth 2 (two) times daily. Qty: 60 tablet, Refills: 0    magnesium oxide (MAG-OX) 400 (241.3 Mg) MG tablet Take 2 tablets  (800 mg total) by mouth 2 (two) times daily. Qty: 120 tablet, Refills: 2    metoprolol tartrate (LOPRESSOR) 25 MG tablet Take 0.5 tablets (12.5 mg total) by mouth 2 (two) times daily. Qty: 30 tablet, Refills: 3      CONTINUE these medications which have NOT CHANGED   Details  albuterol (PROVENTIL HFA;VENTOLIN HFA) 108 (90 Base) MCG/ACT inhaler Inhale 2 puffs into the lungs every 6 (six) hours as needed for wheezing or shortness of breath. Qty: 1 Inhaler, Refills: 2    BIOTIN PO Take 1 tablet by mouth daily. Reported on 01/17/2016    Calcium-Magnesium-Vitamin D (CALCIUM 1200+D3 PO) Take 1 tablet by mouth daily.    cholecalciferol (VITAMIN D) 1000 UNITS tablet Take 1,000 Units by mouth daily.    Coenzyme Q10 200 MG capsule Take 200 mg by mouth daily.    Polyethyl Glycol-Propyl Glycol (SYSTANE) 0.4-0.3 % SOLN Apply 1 drop to eye 4 (four) times daily as needed.     potassium chloride SA (K-DUR,KLOR-CON) 10 MEQ tablet Take 1 tablet (10 mEq total) by mouth daily. Qty: 90 tablet, Refills: 3    pravastatin (PRAVACHOL) 40 MG tablet Take 1 tablet (40 mg total) by mouth daily. Qty: 90 tablet, Refills: 3      STOP taking these medications     atenolol (TENORMIN) 50 MG tablet      triamterene-hydrochlorothiazide (MAXZIDE-25) 37.5-25 MG tablet  warfarin (COUMADIN) 5 MG tablet      predniSONE (DELTASONE) 5 MG tablet          Discharge Condition: Stable   Discharge Instructions Get Medicines reviewed and adjusted: Please take all your medications with you for your next visit with your Primary MD  Please request your Primary MD to go over all hospital tests and procedure/radiological results at the follow up, please ask your Primary MD to get all Hospital records sent to his/her office.  If you experience worsening of your admission symptoms, develop shortness of breath, life threatening emergency, suicidal or homicidal thoughts you must seek medical attention immediately by  calling 911 or calling your MD immediately if symptoms less severe.  You must read complete instructions/literature along with all the possible adverse reactions/side effects for all the Medicines you take and that have been prescribed to you. Take any new Medicines after you have completely understood and accpet all the possible adverse reactions/side effects.   Do not drive when taking Pain medications.   Do not take more than prescribed Pain, Sleep and Anxiety Medications  Special Instructions: If you have smoked or chewed Tobacco in the last 2 yrs please stop smoking, stop any regular Alcohol and or any Recreational drug use.  Wear Seat belts while driving.  Please note  You were cared for by a hospitalist during your hospital stay. Once you are discharged, your primary care physician will handle any further medical issues. Please note that NO REFILLS for any discharge medications will be authorized once you are discharged, as it is imperative that you return to your primary care physician (or establish a relationship with a primary care physician if you do not have one) for your aftercare needs so that they can reassess your need for medications and monitor your lab values.  Discharge Instructions    Ambulatory referral to Neurology    Complete by:  As directed    Pt will follow up with Dr. Erlinda Hong at [facility] in about 2 months. Thanks.   Diet - low sodium heart healthy    Complete by:  As directed    Increase activity slowly    Complete by:  As directed        Allergies  Allergen Reactions  . Amoxicillin     REACTION: rashj  . Penicillins     Has patient had a PCN reaction causing immediate rash, facial/tongue/throat swelling, SOB or lightheadedness with hypotension: no Has patient had a PCN reaction causing severe rash involving mucus membranes or skin necrosis: no Has patient had a PCN reaction that required hospitalization no Has patient had a PCN reaction occurring within the  last 10 years: no If all of the above answers are "NO", then may proceed with Cephalosporin use.       Disposition: SNF   Consults:  Cardiology Neurology *    Significant Diagnostic Studies:  Ct Angio Neck W Or Wo Contrast  Result Date: 10/23/2016 CLINICAL DATA:  Transient ischemic attack. History of atrial fibrillation, hypertension, hyperlipidemia. EXAM: CT ANGIOGRAPHY NECK TECHNIQUE: Multidetector CT imaging of the neck was performed using the standard protocol during bolus administration of intravenous contrast. Multiplanar CT image reconstructions and MIPs were obtained to evaluate the vascular anatomy. Carotid stenosis measurements (when applicable) are obtained utilizing NASCET criteria, using the distal internal carotid diameter as the denominator. CONTRAST:  50 cc Isovue 370 COMPARISON:  CT and MRI head October 21, 2016, MRA head October 21, 2016 FINDINGS: AORTIC ARCH: 7  x 7 mm posterior ascending thoracic aorta thrombus extends 2 cm into the central lumen. Moderate calcific atherosclerosis of the aortic arch. Severe stenosis LEFT subclavian artery origin. Common origin of the innominate and LEFT Common carotid artery. RIGHT CAROTID SYSTEM: Common carotid artery is widely patent, coursing in a straight line fashion. Severe eccentric calcific atherosclerosis results in less than 50% stenosis. Mild luminal irregularity attributable to atherosclerosis cervical internal carotid artery. LEFT CAROTID SYSTEM: Common carotid artery is widely patent, coursing in a straight line fashion. Mild eccentric calcific atherosclerosis. Normal appearance of the carotid bifurcation without hemodynamically significant stenosis by NASCET criteria. Patent internal carotid artery. VERTEBRAL ARTERIES:Severe stenosis LEFT vertebral artery origin due to the calcific atherosclerosis. Mild stenosis RIGHT vertebral artery origin. Bilateral vertebral arteries are patent. Extrinsic deformity due to degenerative  cervical spine. SKELETON: No acute osseous process though bone windows have not been submitted. Cervicothoracic levoscoliosis and multi level severe degenerative changes cervical spine. Moderate canal stenosis C6-7. Severe C3-4 thru C6-7 neural foraminal narrowing. OTHER NECK: Soft tissues of the neck are nonacute though, not tailored for evaluation. Mild centrilobular emphysema in the included lung apices. Main pulmonary artery is 3.4 cm in transaxial dimension, associated with chronic pulmonary arterial hypertension . IMPRESSION: 1. Ascending aortic 7 x 7 x 20 mm mural thrombus with potential for embolism. 2. Severe stenosis LEFT subclavian and LEFT vertebral artery origins. Mild stenosis RIGHT vertebral artery origin. 3. Severe atherosclerosis without hemodynamically significant stenosis of the internal carotid artery's. 4. Moderate canal stenosis C6-7. Severe C3-4 thru C6-7 neural foraminal narrowing. These results will be called to the ordering clinician or representative by the Radiologist Assistant, and communication documented in the [employer] Dashboard Electronically Signed   By: Elon Alas M.D.   On: 10/23/2016 01:51   Mr Jodene Nam Head Wo Contrast  Result Date: 10/21/2016 CLINICAL DATA:  Left hand weakness. EXAM: MRI HEAD WITHOUT CONTRAST MRA HEAD WITHOUT CONTRAST TECHNIQUE: Multiplanar, multiecho pulse sequences of the brain and surrounding structures were obtained without intravenous contrast. Angiographic images of the head were obtained using MRA technique without contrast. COMPARISON:  Head CT 10/21/2016 FINDINGS: MRI HEAD FINDINGS Brain: A partially empty sella is incidentally noted. There is no evidence of acute infarct, intracranial hemorrhage, mass, midline shift, or extra-axial fluid collection. Mild generalized cerebral atrophy is within normal limits for age. Small foci of scattered cerebral white matter T2 hyperintensity are nonspecific but compatible with mild chronic small vessel  ischemic disease. There is a chronic lacunar infarct in the body of the right caudate nucleus. Vascular: Major intracranial vascular flow voids are preserved. Skull and upper cervical spine: Unremarkable bone marrow signal. Sinuses/Orbits: Prior bilateral cataract extraction. Paranasal sinuses and mastoid air cells are clear. Other: None. MRA HEAD FINDINGS The visualized distal vertebral arteries are widely patent and codominant. Cerebellar arteries are grossly patent. Right AICA is dominant. Basilar artery is widely patent. There are patent posterior communicating arteries bilaterally, with a fetal origin of the left PCA noted. No PCA stenosis is seen. The internal carotid arteries are widely patent from skullbase to carotid termini. 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 There is a slightly bulbous appearance of the left ICA at the same level, however a sizable well-defined aneurysm is not identified and the appearance may be due to the vessel's orientation. ACAs and MCAs are patent without evidence of major branch occlusion or significant stenosis. IMPRESSION: 1. No acute intracranial abnormality. 2. Mild chronic small vessel ischemic disease. 3. No major  intracranial vessel occlusion or significant stenosis. 4. 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 Electronically Signed   By: Logan Bores M.D.   On: 10/21/2016 13:05   Mr Brain Wo Contrast  Result Date: 10/21/2016 CLINICAL DATA:  Left hand weakness. EXAM: MRI HEAD WITHOUT CONTRAST MRA HEAD WITHOUT CONTRAST TECHNIQUE: Multiplanar, multiecho pulse sequences of the brain and surrounding structures were obtained without intravenous contrast. Angiographic images of the head were obtained using MRA technique without contrast. COMPARISON:  Head CT 10/21/2016 FINDINGS: MRI HEAD FINDINGS Brain: A partially empty sella is incidentally noted. There is no evidence of acute infarct, intracranial hemorrhage, mass, midline shift, or  extra-axial fluid collection. Mild generalized cerebral atrophy is within normal limits for age. Small foci of scattered cerebral white matter T2 hyperintensity are nonspecific but compatible with mild chronic small vessel ischemic disease. There is a chronic lacunar infarct in the body of the right caudate nucleus. Vascular: Major intracranial vascular flow voids are preserved. Skull and upper cervical spine: Unremarkable bone marrow signal. Sinuses/Orbits: Prior bilateral cataract extraction. Paranasal sinuses and mastoid air cells are clear. Other: None. MRA HEAD FINDINGS The visualized distal vertebral arteries are widely patent and codominant. Cerebellar arteries are grossly patent. Right AICA is dominant. Basilar artery is widely patent. There are patent posterior communicating arteries bilaterally, with a fetal origin of the left PCA noted. No PCA stenosis is seen. The internal carotid arteries are widely patent from skullbase to carotid termini. 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 There is a slightly bulbous appearance of the left ICA at the same level, however a sizable well-defined aneurysm is not identified and the appearance may be due to the vessel's orientation. ACAs and MCAs are patent without evidence of major branch occlusion or significant stenosis. IMPRESSION: 1. No acute intracranial abnormality. 2. Mild chronic small vessel ischemic disease. 3. No major intracranial vessel occlusion or significant stenosis. 4. 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 Electronically Signed   By: Logan Bores M.D.   On: 10/21/2016 13:05   Dg Chest Port 1 View  Result Date: 10/23/2016 CLINICAL DATA:  Congestive failure EXAM: PORTABLE CHEST 1 VIEW COMPARISON:  10/22/2016 FINDINGS: Cardiac shadow is enlarged. Mild vascular congestion is again identified and stable. No new focal infiltrate or sizable effusion is seen. No bony abnormality is noted. IMPRESSION: Mild CHF stable  from the prior exam. Electronically Signed   By: Inez Catalina M.D.   On: 10/23/2016 07:36   Dg Chest Port 1 View  Result Date: 10/22/2016 CLINICAL DATA:  Acute respiratory distress. EXAM: PORTABLE CHEST 1 VIEW COMPARISON:  Radiographs yesterday. FINDINGS: Cardiomegaly, with question progression versus differences in technique. Increased peribronchial and interstitial thickening from prior. Probable left pleural effusion. No confluent airspace disease. No pneumothorax. IMPRESSION: Cardiomegaly, possible progression. Increased peribronchial and interstitial thickening may be pulmonary edema or infectious/ inflammatory. Suspect small left pleural effusion. Electronically Signed   By: Jeb Levering M.D.   On: 10/22/2016 06:01   Dg Abd Acute W/chest  Result Date: 10/21/2016 CLINICAL DATA:  Shortness of breath.  Diarrhea. EXAM: DG ABDOMEN ACUTE W/ 1V CHEST COMPARISON:  Chest radiograph 08/07/2016.  Abdomen CT 02/08/2008 FINDINGS: Cardiomegaly is unchanged. Atherosclerosis of the thoracic aorta. Mild hyperinflation and emphysema, stable from prior. No consolidation, pleural fluid or pneumothorax. No pulmonary edema. No free intra- abdominal air. No dilated bowel loops to suggest obstruction. Small to moderate colonic stool burden. There are pelvic phleboliths. Midline upper abdominal calcifications felt be vascular. No  definite radiopaque calculi. No acute osseous abnormalities are seen. IMPRESSION: 1. Stable cardiomegaly and thoracic aortic atherosclerosis. Stable hyperinflation and emphysema. No acute chest finding. 2. Normal bowel gas pattern.  No bowel obstruction or free air. Electronically Signed   By: Jeb Levering M.D.   On: 10/21/2016 05:02   Ct Head Code Stroke W/o Cm  Addendum Date: 10/21/2016   ADDENDUM REPORT: 10/21/2016 02:23 ADDENDUM: Critical Value/emergent results were called by telephone at the time of interpretation on 10/21/2016 at 2:23 am to Dr. Cheral Marker, Neurology , who verbally  acknowledged these results. Electronically Signed   By: Elon Alas M.D.   On: 10/21/2016 02:23   Result Date: 10/21/2016 CLINICAL DATA:  Code stroke. LEFT-sided weakness. History of hypertension, hyperlipidemia. EXAM: CT HEAD WITHOUT CONTRAST TECHNIQUE: Contiguous axial images were obtained from the base of the skull through the vertex without intravenous contrast. COMPARISON:  None. FINDINGS: BRAIN: The ventricles and sulci are normal for age. No intraparenchymal hemorrhage, mass effect nor midline shift. Patchy supratentorial white matter hypodensities less than expected for patient's age, though non-specific are most compatible with chronic small vessel ischemic disease. No acute large vascular territory infarcts. No abnormal extra-axial fluid collections. Basal cisterns are patent. RIGHT parietal punctate calcification is likely extra-axial. VASCULAR: Mild calcific atherosclerosis of the carotid siphons. SKULL: No skull fracture. No significant scalp soft tissue swelling. SINUSES/ORBITS: The mastoid air-cells and included paranasal sinuses are well-aerated.The included ocular globes and orbital contents are non-suspicious. OTHER: Calcified pannus about the odontoid process compatible with CPPD. ASPECTS [facility] Stroke Program Early CT Score) - Ganglionic level infarction (caudate, lentiform nuclei, internal capsule, insula, M1-M3 cortex): 7 - Supraganglionic infarction (M4-M6 cortex): 3 Total score (0-10 with 10 being normal): 10 IMPRESSION: 1. Negative CT HEAD for age. 2. ASPECTS is 10. Dr. Cheral Marker, neurology paged on October 21, 2016 at 0218 hours, awaiting return call. Electronically Signed: By: Elon Alas M.D. On: 10/21/2016 02:20    2-D echo LV EF: 55% -   60%  ------------------------------------------------------------------- Indications:      TIA 435.9.  ------------------------------------------------------------------- History:   PMH:   Atrial fibrillation.  Risk factors:   Former tobacco use. Hypertension.  ------------------------------------------------------------------- Study Conclusions  - Left ventricle: The cavity size was normal. Wall thickness was   normal. Systolic function was normal. The estimated ejection   fraction was in the range of 55% to 60%. Wall motion was normal;   there were no regional wall motion abnormalities. Doppler   parameters are consistent with high ventricular filling pressure. - Ventricular septum: The contour showed diastolic flattening and   systolic flattening. - Aortic valve: Valve mobility was restricted. - Mitral valve: Calcified annulus. There was mild regurgitation.   Valve area by continuity equation (using LVOT flow): 1.14 cm^2. - Left atrium: The atrium was severely dilated. - Right ventricle: The cavity size was moderately dilated. Systolic   function was moderately reduced. - Right atrium: The atrium was severely dilated. - Atrial septum: There was an atrial septal aneurysm. - Tricuspid valve: There was severe regurgitation. - Pulmonary arteries: Systolic pressure was moderately to severely   increased. PA peak pressure: 62 mm Hg (S). - Pericardium, extracardiac: A trivial pericardial effusion was   identified.  Impressions:  - Normal LV systolic function; elevated LV filling pressure; D   shaped septum; biatrial enlargement; moderate RVE with moderately   reduced RV function; mild MR; severe TR with moderate to severe   elevation in pulmonary pressure.  -------------------------------------------------------------------    Danley Danker  Weights   10/27/16 0412 10/28/16 0500 10/29/16 0258  Weight: 65.3 kg (143 lb 14.4 oz) 71.8 kg (158 lb 4.6 oz) 65.2 kg (143 lb 12.8 oz)     Microbiology: Recent Results (from the past 240 hour(s))  Culture, Urine     Status: Abnormal   Collection Time: 10/27/16 10:11 AM  Result Value Ref Range Status   Specimen Description URINE, RANDOM  Final   Special  Requests NONE  Final   Culture >=100,000 COLONIES/mL ESCHERICHIA COLI (A)  Final   Report Status 10/29/2016 FINAL  Final   Organism ID, Bacteria ESCHERICHIA COLI (A)  Final      Susceptibility   Escherichia coli - MIC*    AMPICILLIN <=2 SENSITIVE Sensitive     CEFAZOLIN <=4 SENSITIVE Sensitive     CEFTRIAXONE <=1 SENSITIVE Sensitive     CIPROFLOXACIN <=0.25 SENSITIVE Sensitive     GENTAMICIN <=1 SENSITIVE Sensitive     IMIPENEM <=0.25 SENSITIVE Sensitive     NITROFURANTOIN <=16 SENSITIVE Sensitive     TRIMETH/SULFA <=20 SENSITIVE Sensitive     AMPICILLIN/SULBACTAM <=2 SENSITIVE Sensitive     PIP/TAZO <=4 SENSITIVE Sensitive     Extended ESBL NEGATIVE Sensitive     * >=100,000 COLONIES/mL ESCHERICHIA COLI       Blood Culture    Component Value Date/Time   SDES URINE, RANDOM 10/27/2016 1011   SPECREQUEST NONE 10/27/2016 1011   CULT >=100,000 COLONIES/mL ESCHERICHIA COLI (A) 10/27/2016 1011   REPTSTATUS 10/29/2016 FINAL 10/27/2016 1011      Labs: Results for orders placed or performed during the hospital encounter of 10/21/16 (from the past 48 hour(s))  Brain natriuretic peptide     Status: Abnormal   Collection Time: 10/27/16  2:10 PM  Result Value Ref Range   B Natriuretic Peptide 764.3 (H) 0.0 - 100.0 pg/mL  Magnesium     Status: Abnormal   Collection Time: 10/28/16  5:20 AM  Result Value Ref Range   Magnesium 1.3 (L) 1.7 - 2.4 mg/dL  Basic metabolic panel     Status: Abnormal   Collection Time: 10/28/16  5:20 AM  Result Value Ref Range   Sodium 138 135 - 145 mmol/L   Potassium 3.5 3.5 - 5.1 mmol/L   Chloride 88 (L) 101 - 111 mmol/L   CO2 41 (H) 22 - 32 mmol/L   Glucose, Bld 88 65 - 99 mg/dL   BUN 18 6 - 20 mg/dL   Creatinine, Ser 0.91 0.44 - 1.00 mg/dL   Calcium 8.5 (L) 8.9 - 10.3 mg/dL   GFR calc non Af Amer 55 (L) >60 mL/min   GFR calc Af Amer >60 >60 mL/min    Comment: (NOTE) The eGFR has been calculated using the CKD EPI equation. This calculation has  not been validated in all clinical situations. eGFR's persistently <60 mL/min signify possible Chronic Kidney Disease.    Anion gap 9 5 - 15  Magnesium     Status: None   Collection Time: 10/29/16  3:42 AM  Result Value Ref Range   Magnesium 1.7 1.7 - 2.4 mg/dL  CBC     Status: Abnormal   Collection Time: 10/29/16  3:42 AM  Result Value Ref Range   WBC 7.8 4.0 - 10.5 K/uL   RBC 3.55 (L) 3.87 - 5.11 MIL/uL   Hemoglobin 10.0 (L) 12.0 - 15.0 g/dL   HCT 32.8 (L) 36.0 - 46.0 %   MCV 92.4 78.0 - 100.0 fL   MCH 28.2 26.0 -  34.0 pg   MCHC 30.5 30.0 - 36.0 g/dL   RDW 14.3 11.5 - 15.5 %   Platelets 158 150 - 400 K/uL     Lipid Panel     Component Value Date/Time   CHOL 149 10/21/2016 0407   TRIG 62 10/21/2016 0407   HDL 78 10/21/2016 0407   CHOLHDL 1.9 10/21/2016 0407   VLDL 12 10/21/2016 0407   LDLCALC 59 10/21/2016 0407        HPI :   83 yo female with a history of PAF and several weeks of exertional dyspnea, but no chest pain. acute onset of LUE ataxia and hand clumsiness and  found to have stroke. Workup, including CT angio of the neck, demonstrated a 7x7 cm posterior ascending thoracic aortic filling defect, thought to be thrombus - this was not a dedicated CT scan of the aorta - no mention of dissection, although there is significant calcified atherosclerosis.  There was severe stenosis of the left subclavian and left vertebral artery origins and severe atherosclerosis but no significant stenosis of the ICA's. Surprisingly, she has been therapeutic on warfarin - therefore, difficult to understand how she has developed thrombus. Also, hard to suggest switching to a DOAC as there is no evidence for this in aortic thrombus.  . She does appear volume overloaded  - echo shows severe TR with high right heart pressures.   Patient required diuresis with IV Lasix. Medication adjustment for A. fib was made      HOSPITAL COURSE:   TIA/Aorticthrombus: Noted on CTA neck 7 x 7 x 20 mm  mural thrombuswith potential for embolism, severe stenosis left subclavian and left vertebral artery origins . Seen by CVTS and deemed not a surgical candiate. -Patient had presented with left hand numbness and slurred speech with rapid improvement with symptoms and a such TPA was not initiated.  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  -Carotid Doppler showed bilateral ICA 40-59% stenosis. 2 DL coordinate had a EF of 55-60% with no source of emboli. Hemoglobin A1c was 5.7. LDL of 59.   -Neurology recommended patient be changed from Coumadin to eliquis for secondary stroke prevention.  - Remains stable at present Pt will follow up with Dr. Erlinda Hong at [facility] in about 6 weeks   Acute on Chronic Diastolic CHF -Patient noted to be significantly short of breath and noted to be an acute CHF exacerbation. Noted edema and severe JVD, but also has severe tricuspid regurgitation and moderate to severe pulmonary hypertension.  - Patient treated with   '40mg'$  bid lasix IV , transitioned  Lasix to by mouth 2/7 -Wt today: 158 lbs  - Wt on admit:168 lbs  - Cardiology also recommended   compression stockings  Renal function remained stable   Escherichia coli UTI Started on ciprofloxacin for 5 days  Chronic atrial fibrillation CHA2DS2VASC score >3 She has been switched to Eliquis from coumadin given the finding of her thrombus. Switched from Coumadin to Eliquis '5mg'$  BID. Continued on ASA -Home atenolol held by Cardiology secondary to soft BP Dig was reduced to 0.'125mg'$    and low dose metoprolol 12.'5mg'$  BID added back. Blood pressure has tolerated, HR now in the 70s-80s  HTN  - Overnight stable Medication adjustment as per cardiology  Dyslipidemia - LDL of 59.  - Will continue statin as tolerated  CKD stage III - Labs were reviewed. Cr remains stable Follow creatinine closely  Ascending Aortic thrombus -Noted on  angiogram of the neck.  -Patient  has been seen in consultation by cardiothoracic surgery who feel that patient is not a candidate for open surgical repair. - Monitor for now  Hypomagnesemia - Mg of 1.3 -1.4. Monitor closely the outpatient setting and replace as needed     Discharge Exam:   Blood pressure (!) 107/51, pulse 85, temperature 97.8 F (36.6 C), temperature source Oral, resp. rate 17, height '5\' 9"'$  (1.753 m), weight 65.2 kg (143 lb 12.8 oz), SpO2 96 %.  HKG:OVPCH WF, No acute distress. Wearing [place] Neck:+ JVD (improved) Cardiac:Irregular, 3/6 holosystolic murmur heard throughout the anterior precordium, no murmurs, rubs, or gallops.  Respiratory:Clear to auscultation bilaterally. EK:BTCY, nontender, non-distended   EL:YHTMB RLE edema, 1+ LLE edema (much improved); No deformity. Neuro:Alert to self and surroundings, more interactive today. Psych: Normal affect     Follow-up Information    Bridges,Jindong, MD. Schedule an appointment as soon as possible for a visit in 6 week(s).   Specialty:  Neurology Contact information: [address] [place] [address] [phone]        Erica Bush, MD. Schedule an appointment as soon as possible for a visit.   Specialty:  Family Medicine Why:  Hospital follow-up in 3-5 days Contact information: [place] [address] [phone]           Signed: Reyne Dumas 10/29/2016, 1:08 PM        Time spent >45 mins

## 2016-10-29 NOTE — Care Management Important Message (Signed)
Important Message  Patient Details  Name: Erica Bridges MRN: [id number] Date of Birth: 20-Nov-1926   Medicare Important Message Given:  Yes    Sheriff Rodenberg Abena 10/29/2016, 11:02 AM

## 2016-11-01 DIAGNOSIS — K625 Hemorrhage of anus and rectum: Secondary | ICD-10-CM | POA: Diagnosis not present

## 2016-11-01 DIAGNOSIS — N390 Urinary tract infection, site not specified: Secondary | ICD-10-CM | POA: Diagnosis not present

## 2016-11-01 DIAGNOSIS — I2789 Other specified pulmonary heart diseases: Secondary | ICD-10-CM | POA: Diagnosis not present

## 2016-11-01 DIAGNOSIS — G458 Other transient cerebral ischemic attacks and related syndromes: Secondary | ICD-10-CM | POA: Diagnosis not present

## 2016-11-01 DIAGNOSIS — I5033 Acute on chronic diastolic (congestive) heart failure: Secondary | ICD-10-CM | POA: Diagnosis not present

## 2016-11-01 DIAGNOSIS — I368 Other nonrheumatic tricuspid valve disorders: Secondary | ICD-10-CM | POA: Diagnosis not present

## 2016-11-01 DIAGNOSIS — J449 Chronic obstructive pulmonary disease, unspecified: Secondary | ICD-10-CM | POA: Diagnosis not present

## 2016-11-22 DIAGNOSIS — K625 Hemorrhage of anus and rectum: Secondary | ICD-10-CM | POA: Diagnosis not present

## 2016-11-22 DIAGNOSIS — I2789 Other specified pulmonary heart diseases: Secondary | ICD-10-CM | POA: Diagnosis not present

## 2016-11-22 DIAGNOSIS — I480 Paroxysmal atrial fibrillation: Secondary | ICD-10-CM | POA: Diagnosis not present

## 2016-11-22 DIAGNOSIS — R600 Localized edema: Secondary | ICD-10-CM | POA: Diagnosis not present

## 2016-11-26 ENCOUNTER — Ambulatory Visit (INDEPENDENT_AMBULATORY_CARE_PROVIDER_SITE_OTHER): Payer: Medicare Other | Admitting: Physician Assistant

## 2016-11-26 ENCOUNTER — Encounter: Payer: Self-pay | Admitting: Physician Assistant

## 2016-11-26 VITALS — BP 100/70 | HR 80 | Ht 67.0 in | Wt 162.4 lb

## 2016-11-26 DIAGNOSIS — D649 Anemia, unspecified: Secondary | ICD-10-CM | POA: Diagnosis not present

## 2016-11-26 DIAGNOSIS — L89302 Pressure ulcer of unspecified buttock, stage 2: Secondary | ICD-10-CM

## 2016-11-26 DIAGNOSIS — K648 Other hemorrhoids: Secondary | ICD-10-CM | POA: Diagnosis not present

## 2016-11-26 MED ORDER — HYDROCORTISONE 2.5 % RE CREA: 1.0000 "application " | TOPICAL_CREAM | Freq: Two times a day (BID) | RECTAL | 1 refills | Status: DC

## 2016-11-26 NOTE — Patient Instructions (Addendum)
If you are age 81 or older, your body mass index should be between 23-30. Your Body mass index is 25.43 kg/m. If this is out of the aforementioned range listed, please consider follow up with your Primary Care Provider.  If you are age 58 or younger, your body mass index should be between 19-25. Your Body mass index is 25.43 kg/m. If this is out of the aformentioned range listed, please consider follow up with your Primary Care Provider.   We have sent the following medications to your pharmacy for you to pick up at your convenience:  Hydrocortisone cream  Please follow up with Anderson Malta, PA or Dr. Carlean Purl as needed.  Thank you.

## 2016-11-26 NOTE — Progress Notes (Addendum)
Chief Complaint: Rectal bleeding  HPI:  Erica Bridges is an 83 year old Caucasian female with past medical history of A. fib, recent stroke in January maintained on Eliquis and aspirin, diverticulosis, external hemorrhoids, internal hemorrhoids and others listed below,  who was referred to me by Ria Bush, MD for a complaint of rectal bleeding.     Recent labs show a CBC completed 10/29/16 with a hemoglobin of 10, MCV normal. One month ago hemoglobin was 11.5.     Patient follows with Dr. Carlean Purl. Her last colonoscopy was 03/27/13 done for heme positive stool. Findings at that time were of sessile polyp measuring 1 cm in size in the cecum, diminutive sessile polyps in the splenic flexure, small angiodysplastic lesion at the cecum, moderate diverticulosis in the sigmoid colon and large internal hemorrhoids.   Today, the patient presents to clinic accompanied by her son. Together they tell me that she has had a rough start to the year. In fact, she just returned home yesterday from her rehabilitation after a CHF exacerbation and recent stroke in January. They have also changed the patient's blood thinner from warfarin to Eliquis at the beginning of the year. She was sent home with a walker and is on chronic oxygen. The patient explains that she has been seeing some bright red blood in her stools for "some time" now. She tells me that before going to the hospital in January she was using some Preparation H cream for this, which was helping. Since being in rehabilitation since January she has not been using this and has seen an increase in bright red blood and tells me that her bottom is "sore all the time". Patient tells me that she sees bright red blood on wiping with every bowel movement. She denies constipation or diarrhea.   Patient denies fever, chills, change in bowel habits or abdominal pain.  Past Medical History:  Diagnosis Date  . Asthma   . Atrial fibrillation ([place])   . Benign neoplasm of  colon 10/22/2005   Hyperplastic  & Adenomatous polyps   . Cardiomegaly 2013   by xray  . Diverticulosis of colon (without mention of hemorrhage)   . External hemorrhoids without mention of complication   . HLD (hyperlipidemia)   . HTN (hypertension)   . Malignant neoplasm of corpus uteri, except isthmus ([place])    followed by [name] with yearly visits  . Onychia and paronychia of toe   . Osteopenia 2017   by xray, but DEXA WNL 2014  . Pneumonia   . Polycythemia 11/2011   normal EPO and periph smear    Past Surgical History:  Procedure Laterality Date  . CATARACT EXTRACTION Bilateral   . COLONOSCOPY  10/22/05   polyps-bx negative; divertics, ext hemmorhoids  . COLONOSCOPY  03/2013   tubular adenomas, small angiodysplastic lesion at cecum, mod diverticulosis Carlean Purl)  . DEXA  2014   WNL  . GYNECOLOGIC CRYOSURGERY    . LUMBAR DISC SURGERY  1970s  . REFRACTIVE SURGERY Bilateral   . TONSILLECTOMY    . TOTAL ABDOMINAL HYSTERECTOMY W/ BILATERAL SALPINGOOPHORECTOMY  2005   uterine adenocarcinoma    Current Outpatient Prescriptions  Medication Sig Dispense Refill  . apixaban (ELIQUIS) 5 MG TABS tablet Take 1 tablet (5 mg total) by mouth 2 (two) times daily. 60 tablet 3  . aspirin EC 81 MG EC tablet Take 1 tablet (81 mg total) by mouth daily. 30 tablet 1  . Calcium-Magnesium-Vitamin D (CALCIUM 1200+D3 PO) Take 1 tablet by  mouth daily.    . cholecalciferol (VITAMIN D) 1000 UNITS tablet Take 1,000 Units by mouth daily.    . Coenzyme Q10 200 MG capsule Take 200 mg by mouth daily.    . digoxin (LANOXIN) 0.125 MG tablet Take 1 tablet (0.125 mg total) by mouth daily. 30 tablet 2  . furosemide (LASIX) 40 MG tablet Take 1 tablet (40 mg total) by mouth 2 (two) times daily. 60 tablet 1  . guaiFENesin (MUCINEX) 600 MG 12 hr tablet Take 1 tablet (600 mg total) by mouth 2 (two) times daily. 60 tablet 0  . magnesium oxide (MAG-OX) 400 (241.3 Mg) MG tablet Take 2 tablets (800 mg total) by mouth 2  (two) times daily. 120 tablet 2  . metoprolol tartrate (LOPRESSOR) 25 MG tablet Take 0.5 tablets (12.5 mg total) by mouth 2 (two) times daily. 30 tablet 3  . Polyethyl Glycol-Propyl Glycol (SYSTANE) 0.4-0.3 % SOLN Apply 1 drop to eye 4 (four) times daily as needed.     . potassium chloride SA (K-DUR,KLOR-CON) 10 MEQ tablet Take 1 tablet (10 mEq total) by mouth daily. 90 tablet 3  . pravastatin (PRAVACHOL) 40 MG tablet Take 1 tablet (40 mg total) by mouth daily. 90 tablet 3  . tiotropium (SPIRIVA) 18 MCG inhalation capsule Place 18 mcg into inhaler and inhale daily.    . hydrocortisone (ANUSOL-HC) 2.5 % rectal cream Place 1 application rectally 2 (two) times daily. Place inside rectum twice daily for 7 days 30 g 1   No current facility-administered medications for this visit.     Allergies as of 11/26/2016 - Review Complete 11/26/2016  Allergen Reaction Noted  . Amoxicillin  02/22/2007  . Penicillins  02/22/2007    Family History  Problem Relation Age of Onset  . Hypertension Father   . Stroke Father   . Diabetes Mother   . Coronary artery disease Mother   . Hypertension Mother   . Colon cancer Neg Hx     Social History   Social History  . Marital status: Widowed    Spouse name: N/A  . Number of children: 3  . Years of education: N/A   Occupational History  . Retired Radiation protection practitioner    Social History Main Topics  . Smoking status: Former Smoker    Types: Cigarettes    Quit date: 09/21/1982  . Smokeless tobacco: Never Used  . Alcohol use No  . Drug use: No  . Sexual activity: Not on file   Other Topics Concern  . Not on file   Social History Narrative   Retired, widow   One son Janeece Riggers - part time post office), one daughter ([place] Engineer, mining), other daughter Janeece Riggers)   Daily caffeine 1+    Review of Systems:    Constitutional: No weight loss, fever or chills Skin: No rash Cardiovascular: No chest pain Respiratory: No SOB  Gastrointestinal:  See HPI and otherwise negative Genitourinary: No dysuria or change in urinary frequency Neurological: No headache Musculoskeletal: No new muscle or joint pain Hematologic: No bruising Psychiatric: No history of depression or anxiety   Physical Exam:  Vital signs: BP 100/70   Pulse 80   Ht 5\' 7"  (1.702 m)   Wt 162 lb 6 oz (73.7 kg)   BMI 25.43 kg/m   Constitutional:   Pleasant Elderly Caucasian female appears to be in NAD, Well developed, Well nourished, alert and cooperative Head:  Normocephalic and atraumatic. Eyes:   PEERL, EOMI. No icterus. Conjunctiva pink. Ears:  Normal  auditory acuity. Neck:  Supple Throat: Oral cavity and pharynx without inflammation, swelling or lesion.  Respiratory: Respirations even and unlabored. Lungs clear to auscultation bilaterally.   No wheezes, crackles, or rhonchi. On O2 via [place] Cardiovascular: Normal S1, S2. No MRG. Regular rate and rhythm. No peripheral edema, cyanosis or pallor.  Gastrointestinal:  Soft, nondistended, nontender. No rebound or guarding. Normal bowel sounds. No appreciable masses or hepatomegaly. Rectal: External exam: stage I/II decubitus ulcer surrounding rectum with multiple ulcerations, ttp; Anoscopy: Internal hemorrhoids inflamed with stigmata of recent bleeding, some brown stool in rectal vault Msk:  Symmetrical without gross deformities. Without edema, no deformity or joint abnormality.  Neurologic:  Alert and  oriented x4;  grossly normal neurologically.  Skin:   Dry and intact without significant lesions or rashes. Psychiatric: Demonstrates good judgement and reason without abnormal affect or behaviors.  MOST RECENT LABS: CBC    Component Value Date/Time   WBC 7.8 10/29/2016 0342   RBC 3.55 (L) 10/29/2016 0342   HGB 10.0 (L) 10/29/2016 0342   HCT 32.8 (L) 10/29/2016 0342   PLT 158 10/29/2016 0342   MCV 92.4 10/29/2016 0342   MCH 28.2 10/29/2016 0342   MCHC 30.5 10/29/2016 0342   RDW 14.3 10/29/2016 0342    LYMPHSABS 1.1 10/21/2016 0204   MONOABS 0.9 10/21/2016 0204   EOSABS 0.1 10/21/2016 0204   BASOSABS 0.0 10/21/2016 0204    CMP     Component Value Date/Time   NA 138 10/28/2016 0520   K 3.5 10/28/2016 0520   CL 88 (L) 10/28/2016 0520   CO2 41 (H) 10/28/2016 0520   GLUCOSE 88 10/28/2016 0520   BUN 18 10/28/2016 0520   CREATININE 0.91 10/28/2016 0520   CALCIUM 8.5 (L) 10/28/2016 0520   PROT 6.4 (L) 10/21/2016 0204   ALBUMIN 3.3 (L) 10/21/2016 0204   AST 25 10/21/2016 0204   ALT 16 10/21/2016 0204   ALKPHOS 76 10/21/2016 0204   BILITOT 0.7 10/21/2016 0204   GFRNONAA 55 (L) 10/28/2016 0520   GFRAA >60 10/28/2016 0520    Assessment: 1. Stage II decubitus ulcer of the buttocks: Painful to palpation 2. Internal hemorrhoids: Inflamed, stigmata of recent bleeding seen at time of endoscopy today 3. Low hemoglobin: MCV normal, likely this is anemia of chronic disease with exacerbation from recent bleeding  Plan: 1. Did discuss case with Dr. Carlean Purl at time of patient's appointment. Sent notification to patient's primary care provider regarding decubitus ulcers. These will likely need treatment as patient is painful to palpation in this area and there is already skin breakdown. 2. Prescribed hydrocortisone cream 2.5% be applied rectally twice a day 7 days with one refill 3. Patient encouraged to move around as much as possible/alternate hips that she sits on to take pressure off of her bottom. 4. At this time, no need for further workup of low hemoglobin as this is likely related to chronic disease and possibly exacerbated by recent rectal bleeding. 5. Patient to follow in clinic as needed with [place] in the future.  Erica Newer, PA-C [facility] 11/26/2016, 3:46 PM  Cc: Ria Bush, MD   Agree with Ms. Lillyonna Armstead's evaluation and management. Have communicated with primary care about making wound care referral for decubiti  Gatha Mayer, MD, [facility]

## 2016-11-27 ENCOUNTER — Telehealth: Payer: Self-pay

## 2016-11-27 ENCOUNTER — Telehealth: Payer: Self-pay | Admitting: Internal Medicine

## 2016-11-27 DIAGNOSIS — I130 Hypertensive heart and chronic kidney disease with heart failure and stage 1 through stage 4 chronic kidney disease, or unspecified chronic kidney disease: Secondary | ICD-10-CM | POA: Diagnosis not present

## 2016-11-27 DIAGNOSIS — M6281 Muscle weakness (generalized): Secondary | ICD-10-CM | POA: Diagnosis not present

## 2016-11-27 DIAGNOSIS — Z8673 Personal history of transient ischemic attack (TIA), and cerebral infarction without residual deficits: Secondary | ICD-10-CM | POA: Diagnosis not present

## 2016-11-27 DIAGNOSIS — R2689 Other abnormalities of gait and mobility: Secondary | ICD-10-CM | POA: Diagnosis not present

## 2016-11-27 DIAGNOSIS — I5032 Chronic diastolic (congestive) heart failure: Secondary | ICD-10-CM | POA: Diagnosis not present

## 2016-11-27 DIAGNOSIS — N183 Chronic kidney disease, stage 3 (moderate): Secondary | ICD-10-CM | POA: Diagnosis not present

## 2016-11-27 NOTE — Telephone Encounter (Signed)
Erica Bridges with [facility] said that pt having diarrhea and wants to know what pt can take for diarrhea since on O2 continuously, CVA and CHF. Pt not on Coumadin due to rectal bleeding. Pt only taking ASA. Per Leafy Ro RN team lead since pt seen by GI on 11/26/16 and pt has rectal bleeding advised Erica Bridges to contact [facility]. Erica Bridges voiced understanding.

## 2016-11-27 NOTE — Telephone Encounter (Signed)
Agree. If this continues would order stool studies to include Cdiff and GI pathogen panel. Pt did just return home from nursing facility. She also has PCP appt Tuesday.  Thanks-JLL

## 2016-11-27 NOTE — Telephone Encounter (Signed)
Patient with new onset diarrhea today.  Home health nurse is asking what she can do.  She is advised to try imodium and have the patient or family call back if her symptoms worsen.

## 2016-11-30 DIAGNOSIS — N183 Chronic kidney disease, stage 3 (moderate): Secondary | ICD-10-CM | POA: Diagnosis not present

## 2016-11-30 DIAGNOSIS — I5032 Chronic diastolic (congestive) heart failure: Secondary | ICD-10-CM | POA: Diagnosis not present

## 2016-11-30 DIAGNOSIS — Z8673 Personal history of transient ischemic attack (TIA), and cerebral infarction without residual deficits: Secondary | ICD-10-CM | POA: Diagnosis not present

## 2016-11-30 DIAGNOSIS — R2689 Other abnormalities of gait and mobility: Secondary | ICD-10-CM | POA: Diagnosis not present

## 2016-11-30 DIAGNOSIS — M6281 Muscle weakness (generalized): Secondary | ICD-10-CM | POA: Diagnosis not present

## 2016-11-30 DIAGNOSIS — I130 Hypertensive heart and chronic kidney disease with heart failure and stage 1 through stage 4 chronic kidney disease, or unspecified chronic kidney disease: Secondary | ICD-10-CM | POA: Diagnosis not present

## 2016-12-01 ENCOUNTER — Telehealth: Payer: Self-pay | Admitting: Family Medicine

## 2016-12-01 DIAGNOSIS — I130 Hypertensive heart and chronic kidney disease with heart failure and stage 1 through stage 4 chronic kidney disease, or unspecified chronic kidney disease: Secondary | ICD-10-CM | POA: Diagnosis not present

## 2016-12-01 DIAGNOSIS — I5032 Chronic diastolic (congestive) heart failure: Secondary | ICD-10-CM | POA: Diagnosis not present

## 2016-12-01 DIAGNOSIS — N183 Chronic kidney disease, stage 3 (moderate): Secondary | ICD-10-CM | POA: Diagnosis not present

## 2016-12-01 DIAGNOSIS — Z8673 Personal history of transient ischemic attack (TIA), and cerebral infarction without residual deficits: Secondary | ICD-10-CM | POA: Diagnosis not present

## 2016-12-01 DIAGNOSIS — M6281 Muscle weakness (generalized): Secondary | ICD-10-CM | POA: Diagnosis not present

## 2016-12-01 DIAGNOSIS — R2689 Other abnormalities of gait and mobility: Secondary | ICD-10-CM | POA: Diagnosis not present

## 2016-12-01 NOTE — Telephone Encounter (Signed)
[facility] called with request for PT  1 time a week for first week 2 times a week for 2 weeks 1 time a week for last week  Thanks

## 2016-12-01 NOTE — Telephone Encounter (Signed)
No contact number for PT. Contacted [facility] directly and got # for [place], [place]. Message left okaying PT.

## 2016-12-02 ENCOUNTER — Ambulatory Visit (INDEPENDENT_AMBULATORY_CARE_PROVIDER_SITE_OTHER): Payer: Medicare Other | Admitting: Family Medicine

## 2016-12-02 VITALS — BP 118/60 | HR 68 | Temp 97.3°F | Wt 157.0 lb

## 2016-12-02 DIAGNOSIS — I7410 Embolism and thrombosis of unspecified parts of aorta: Secondary | ICD-10-CM

## 2016-12-02 DIAGNOSIS — I749 Embolism and thrombosis of unspecified artery: Secondary | ICD-10-CM

## 2016-12-02 DIAGNOSIS — G459 Transient cerebral ischemic attack, unspecified: Secondary | ICD-10-CM | POA: Diagnosis not present

## 2016-12-02 DIAGNOSIS — I482 Chronic atrial fibrillation, unspecified: Secondary | ICD-10-CM

## 2016-12-02 DIAGNOSIS — I5033 Acute on chronic diastolic (congestive) heart failure: Secondary | ICD-10-CM

## 2016-12-02 LAB — COMPREHENSIVE METABOLIC PANEL
ALT: 10 U/L (ref 0–35)
AST: 22 U/L (ref 0–37)
Albumin: 3.4 g/dL — ABNORMAL LOW (ref 3.5–5.2)
Alkaline Phosphatase: 82 U/L (ref 39–117)
BUN: 25 mg/dL — AB (ref 6–23)
CO2: 40 meq/L — AB (ref 19–32)
Calcium: 10.2 mg/dL (ref 8.4–10.5)
Chloride: 95 mEq/L — ABNORMAL LOW (ref 96–112)
Creatinine, Ser: 1.46 mg/dL — ABNORMAL HIGH (ref 0.40–1.20)
GFR: 35.96 mL/min — AB (ref >60.00)
Glucose, Bld: 91 mg/dL (ref 70–99)
POTASSIUM: 3.9 meq/L (ref 3.5–5.1)
SODIUM: 141 meq/L (ref 135–145)
Total Bilirubin: 0.6 mg/dL (ref 0.2–1.2)
Total Protein: 6.5 g/dL (ref 6.0–8.3)

## 2016-12-02 LAB — CBC WITH DIFFERENTIAL/PLATELET
Basophils Absolute: 0.1 10*3/uL (ref 0.0–0.1)
Basophils Relative: 1.6 % (ref 0.0–3.0)
EOS PCT: 8.4 % — AB (ref 0.0–5.0)
Eosinophils Absolute: 0.5 10*3/uL (ref 0.0–0.7)
HCT: 27 % — ABNORMAL LOW (ref 36.0–46.0)
Hemoglobin: 8.3 g/dL — ABNORMAL LOW (ref 12.0–15.0)
LYMPHS ABS: 0.8 10*3/uL (ref 0.7–4.0)
Lymphocytes Relative: 14.9 % (ref 12.0–46.0)
MCHC: 30.8 g/dL (ref 30.0–36.0)
MCV: 83.9 fl (ref 78.0–100.0)
MONO ABS: 1.2 10*3/uL — AB (ref 0.1–1.0)
NEUTROS ABS: 3.1 10*3/uL (ref 1.4–7.7)
NEUTROS PCT: 54.6 % (ref 43.0–77.0)
PLATELETS: 214 10*3/uL (ref 150.0–400.0)
RBC: 3.22 Mil/uL — ABNORMAL LOW (ref 3.87–5.11)
RDW: 16.5 % — AB (ref 11.5–15.5)
WBC: 5.7 10*3/uL (ref 4.0–10.5)

## 2016-12-02 NOTE — Telephone Encounter (Signed)
agree

## 2016-12-02 NOTE — Assessment & Plan Note (Signed)
Working with PT/OT at home now. Feels she is regaining some strength Follow up scheduled with neurology last week. Continue eliquis for secondary stroke prevention.

## 2016-12-02 NOTE — Assessment & Plan Note (Addendum)
Now on O2 with malfunctioning tank.  O2 sats at 70%.  We changed to one of our tanks and O2 sats went up to 99% on 2 L.  We helped the family to contact [facility] as well. Weight stable. Check CMET to evaluate electrolytes and renal function.

## 2016-12-02 NOTE — Progress Notes (Signed)
Pre visit review using our clinic review tool, if applicable. No additional management support is needed unless otherwise documented below in the visit note. 

## 2016-12-02 NOTE — Assessment & Plan Note (Signed)
She is not a surgical candidate. We did review her images today.

## 2016-12-02 NOTE — Progress Notes (Signed)
Subjective:   Patient ID: Erica Bridges, female    DOB: April 10, 1927, 83 y.o.   MRN: [id number]  Erica Bridges is a pleasant 83 y.o. year old female pt of Dr. Darnell Level, new to me, who presents to clinic today with her daughter for Nursing Home Discharge (D/C form [facility] 11-25-16 for TIA, AFib, CHF, Aortic Thrombsis)  on 12/02/2016  HPI:  Admitted to the hospital 10/21/16- 10/29/16 for progressive DOE, acute onset of focal neurological symptoms as well.  Notes reviewed.  TIA/aortic thrombus-   CT angio of neck showed an ascending filling defect- ? Thrombus, without mention of dissection. Echo showed severe TR with elevated righ heart pressures. Was seen by CVTS- deemed not a good surgical candidate.  Neuro recommended change from coumadin to eliquis for secondary stroke prevention. She had been on coumadin for chronic a fib.  Was sent home with lower dose of digoxin and lower dose of metoprolol as well.  Also found to be in acute on chronic CHF (diastolic)- was very sob with exertion and had edema and severe JVD on admission. She was treated with iv and then oral lasix and discharged 10 pounds lighter than she was on admission.  Treated for UTI with cipro.  Discharged to [facility] for rehab, discharged from [facility] on 11/25/16.    Current Outpatient Prescriptions on File Prior to Visit  Medication Sig Dispense Refill  . aspirin EC 81 MG EC tablet Take 1 tablet (81 mg total) by mouth daily. 30 tablet 1  . Calcium-Magnesium-Vitamin D (CALCIUM 1200+D3 PO) Take 1 tablet by mouth daily.    . cholecalciferol (VITAMIN D) 1000 UNITS tablet Take 1,000 Units by mouth daily.    . Coenzyme Q10 200 MG capsule Take 200 mg by mouth daily.    . digoxin (LANOXIN) 0.125 MG tablet Take 1 tablet (0.125 mg total) by mouth daily. 30 tablet 2  . hydrocortisone (ANUSOL-HC) 2.5 % rectal cream Place 1 application rectally 2 (two) times daily. Place inside rectum twice daily for 7 days 30 g 1  . Polyethyl Glycol-Propyl  Glycol (SYSTANE) 0.4-0.3 % SOLN Apply 1 drop to eye 4 (four) times daily as needed.     . potassium chloride SA (K-DUR,KLOR-CON) 10 MEQ tablet Take 1 tablet (10 mEq total) by mouth daily. 90 tablet 3  . pravastatin (PRAVACHOL) 40 MG tablet Take 1 tablet (40 mg total) by mouth daily. 90 tablet 3  . tiotropium (SPIRIVA) 18 MCG inhalation capsule Place 18 mcg into inhaler and inhale daily.    . furosemide (LASIX) 40 MG tablet Take 1 tablet (40 mg total) by mouth 2 (two) times daily. 60 tablet 1  . metoprolol tartrate (LOPRESSOR) 25 MG tablet Take 0.5 tablets (12.5 mg total) by mouth 2 (two) times daily. 30 tablet 3   No current facility-administered medications on file prior to visit.     Allergies  Allergen Reactions  . Amoxicillin     REACTION: rashj  . Penicillins     Has patient had a PCN reaction causing immediate rash, facial/tongue/throat swelling, SOB or lightheadedness with hypotension: no Has patient had a PCN reaction causing severe rash involving mucus membranes or skin necrosis: no Has patient had a PCN reaction that required hospitalization no Has patient had a PCN reaction occurring within the last 10 years: no If all of the above answers are "NO", then may proceed with Cephalosporin use.     Past Medical History:  Diagnosis Date  . Asthma   .  Atrial fibrillation ([place])   . Benign neoplasm of colon 10/22/2005   Hyperplastic  & Adenomatous polyps   . Cardiomegaly 2013   by xray  . Diverticulosis of colon (without mention of hemorrhage)   . External hemorrhoids without mention of complication   . HLD (hyperlipidemia)   . HTN (hypertension)   . Malignant neoplasm of corpus uteri, except isthmus ([place])    followed by [name] with yearly visits  . Onychia and paronychia of toe   . Osteopenia 2017   by xray, but DEXA WNL 2014  . Pneumonia   . Polycythemia 11/2011   normal EPO and periph smear    Past Surgical History:  Procedure Laterality Date  . CATARACT EXTRACTION  Bilateral   . COLONOSCOPY  10/22/05   polyps-bx negative; divertics, ext hemmorhoids  . COLONOSCOPY  03/2013   tubular adenomas, small angiodysplastic lesion at cecum, mod diverticulosis Carlean Purl)  . DEXA  2014   WNL  . GYNECOLOGIC CRYOSURGERY    . LUMBAR DISC SURGERY  1970s  . REFRACTIVE SURGERY Bilateral   . TONSILLECTOMY    . TOTAL ABDOMINAL HYSTERECTOMY W/ BILATERAL SALPINGOOPHORECTOMY  2005   uterine adenocarcinoma    Family History  Problem Relation Age of Onset  . Hypertension Father   . Stroke Father   . Diabetes Mother   . Coronary artery disease Mother   . Hypertension Mother   . Colon cancer Neg Hx     Social History   Social History  . Marital status: Widowed    Spouse name: N/A  . Number of children: 3  . Years of education: N/A   Occupational History  . Retired Radiation protection practitioner    Social History Main Topics  . Smoking status: Former Smoker    Types: Cigarettes    Quit date: 09/21/1982  . Smokeless tobacco: Never Used  . Alcohol use No  . Drug use: No  . Sexual activity: Not on file   Other Topics Concern  . Not on file   Social History Narrative   Retired, widow   One son Janeece Riggers - part time post office), one daughter ([place] Engineer, mining), other daughter Janeece Riggers)   Daily caffeine 1+   The PMH, PSH, Social History, Family History, Medications, and allergies have been reviewed in [facility], and have been updated if relevant.   Review of Systems  Gastrointestinal: Positive for diarrhea. Negative for abdominal distention, abdominal pain, anal bleeding, blood in stool, constipation, nausea, rectal pain and vomiting.  Neurological: Positive for weakness. Negative for dizziness, tremors, seizures, syncope, speech difficulty, light-headedness and numbness.  All other systems reviewed and are negative.      Objective:    BP 118/60 (BP Location: Left Arm, Patient Position: Sitting, Cuff Size: Normal)   Temp 97.3 F (36.3 C) (Oral)   Wt 157  lb (71.2 kg)   BMI 24.59 kg/m    Physical Exam  Constitutional: She is oriented to person, place, and time. She appears well-developed and well-nourished. No distress.  O2 per [place]  HENT:  Head: Normocephalic and atraumatic.  Eyes: Conjunctivae are normal.  Cardiovascular:  Irregular, 3/6 holosystolic murmur heard throughout the anterior precordium, no murmurs, rubs, or gallops.   Pulmonary/Chest: Effort normal.  Neurological: She is alert and oriented to person, place, and time. No cranial nerve deficit.  Skin: Skin is warm and dry. She is not diaphoretic.  Psychiatric: She has a normal mood and affect. Her behavior is normal. Judgment and thought content normal.  Nursing note  and vitals reviewed.         Assessment & Plan:   TIA due to embolism (HCC)  Acute on chronic diastolic CHF (congestive heart failure) (HCC)  Aortic thrombus (HCC)  Chronic atrial fibrillation (HCC) No Follow-up on file.

## 2016-12-03 DIAGNOSIS — I130 Hypertensive heart and chronic kidney disease with heart failure and stage 1 through stage 4 chronic kidney disease, or unspecified chronic kidney disease: Secondary | ICD-10-CM | POA: Diagnosis not present

## 2016-12-03 DIAGNOSIS — Z8673 Personal history of transient ischemic attack (TIA), and cerebral infarction without residual deficits: Secondary | ICD-10-CM | POA: Diagnosis not present

## 2016-12-03 DIAGNOSIS — I5032 Chronic diastolic (congestive) heart failure: Secondary | ICD-10-CM | POA: Diagnosis not present

## 2016-12-03 DIAGNOSIS — M6281 Muscle weakness (generalized): Secondary | ICD-10-CM | POA: Diagnosis not present

## 2016-12-03 DIAGNOSIS — N183 Chronic kidney disease, stage 3 (moderate): Secondary | ICD-10-CM | POA: Diagnosis not present

## 2016-12-03 DIAGNOSIS — R2689 Other abnormalities of gait and mobility: Secondary | ICD-10-CM | POA: Diagnosis not present

## 2016-12-03 NOTE — Telephone Encounter (Signed)
Error

## 2016-12-04 DIAGNOSIS — N183 Chronic kidney disease, stage 3 (moderate): Secondary | ICD-10-CM | POA: Diagnosis not present

## 2016-12-04 DIAGNOSIS — I130 Hypertensive heart and chronic kidney disease with heart failure and stage 1 through stage 4 chronic kidney disease, or unspecified chronic kidney disease: Secondary | ICD-10-CM | POA: Diagnosis not present

## 2016-12-04 DIAGNOSIS — R2689 Other abnormalities of gait and mobility: Secondary | ICD-10-CM | POA: Diagnosis not present

## 2016-12-04 DIAGNOSIS — M6281 Muscle weakness (generalized): Secondary | ICD-10-CM | POA: Diagnosis not present

## 2016-12-04 DIAGNOSIS — Z8673 Personal history of transient ischemic attack (TIA), and cerebral infarction without residual deficits: Secondary | ICD-10-CM | POA: Diagnosis not present

## 2016-12-04 DIAGNOSIS — I5032 Chronic diastolic (congestive) heart failure: Secondary | ICD-10-CM | POA: Diagnosis not present

## 2016-12-07 ENCOUNTER — Encounter: Payer: Self-pay | Admitting: Diagnostic Neuroimaging

## 2016-12-07 ENCOUNTER — Ambulatory Visit (INDEPENDENT_AMBULATORY_CARE_PROVIDER_SITE_OTHER): Payer: Medicare Other | Admitting: Diagnostic Neuroimaging

## 2016-12-07 VITALS — BP 104/60 | HR 58 | Wt 149.2 lb

## 2016-12-07 DIAGNOSIS — I771 Stricture of artery: Secondary | ICD-10-CM

## 2016-12-07 DIAGNOSIS — I7410 Embolism and thrombosis of unspecified parts of aorta: Secondary | ICD-10-CM

## 2016-12-07 DIAGNOSIS — I5032 Chronic diastolic (congestive) heart failure: Secondary | ICD-10-CM

## 2016-12-07 DIAGNOSIS — I482 Chronic atrial fibrillation, unspecified: Secondary | ICD-10-CM

## 2016-12-07 DIAGNOSIS — G459 Transient cerebral ischemic attack, unspecified: Secondary | ICD-10-CM

## 2016-12-07 DIAGNOSIS — I749 Embolism and thrombosis of unspecified artery: Secondary | ICD-10-CM

## 2016-12-07 NOTE — Progress Notes (Signed)
[facility]  PATIENT: Erica Bridges DOB: 15-Jan-1927  REFERRING CLINICIAN: Erlinda Hong / stroke team HISTORY FROM: patient  REASON FOR VISIT: new consult    HISTORICAL  CHIEF COMPLAINT:  Chief Complaint  Patient presents with  . Cerebrovascular Accident    rm 7, New Pt, hospital FU, dgtr- [place]    HISTORY OF PRESENT ILLNESS:   NEW HPI (VRP, 12/07/16): 83 year old female with hospital admission from 10/21/2016 until 10/29/2016 for transient left hand incoordination and numbness, slurred speech, diagnosed as TIA related to aortic thrombus, here for stroke/TIA follow-up. After hospital discharge patient lives at skilled nursing facility for rehabilitation. Now patient living back at home. She lives alone and is cared for by her daughter and an aide who visits her 3 times per week. Patient no longer drives. She continues to have balance and gait difficulty, insomnia, memory loss, confusion and dizziness. She needs help with many of her activities of daily living. There is some stress between patient and daughter and other family members related to discussions about the best method for her long-term care.  In addition when patient was at [facility] facility, she was having some bright red blood per rectum and therefore eliquis was was discontinued and she was maintained on aspirin only. However this was not apparently discussed with her PCP or GI doctor.   DISCHARGE SUMMARY (Dr. Allyson Sabal, 10/29/16): "TIA/Aorticthrombus: Noted on CTA neck 7 x 7 x 20 mm mural thrombuswith potential for embolism, severe stenosis left subclavian and left vertebral artery origins. Seen by CVTS and deemed not a surgical candiate. -Patient had presented with left hand numbness and slurred speech with rapid improvement with symptoms and a such TPA was not initiated.  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  -Carotid Doppler showed bilateral  ICA 40-59% stenosis. 2 DL coordinate had a EF of 55-60% with no source of emboli. Hemoglobin A1c was 5.7. LDL of 59.  -Neurology recommended patient be changed from Coumadin to eliquis for secondary stroke prevention.  Chronic atrial fibrillation CHA2DS2VASC score >3 She has been switched to Eliquis from coumadin given the finding of her thrombus. Switched from Coumadin to Eliquis 5mg  BID. Continued on ASA -Home atenolol held by Cardiology secondary to soft BP Dig was reduced to 0.125mg  and low dose metoprolol 12.5mg  BID added back. Blood pressure has tolerated, HR now in the 70s-80s  Ascending Aortic thrombus -Noted on angiogram of the neck.  -Patient has been seen in consultation by cardiothoracic surgery who feel that patient is not a candidate for open surgical repair. - Monitor for now"   REVIEW OF SYSTEMS: Full 14 system review of systems performed and negative with exception of: Memory loss confusion numbness weakness dizziness insomnia sleepiness cramps not asleep decreased energy Raynaud's frequent infections urination problems blood in urine diarrhea shortness of breath wheezing blurred vision loss of vision weight loss fatigue palpitations swelling legs hearing loss itching.  ALLERGIES: Allergies  Allergen Reactions  . Amoxicillin     REACTION: rashj  . Penicillins     Has patient had a PCN reaction causing immediate rash, facial/tongue/throat swelling, SOB or lightheadedness with hypotension: no Has patient had a PCN reaction causing severe rash involving mucus membranes or skin necrosis: no Has patient had a PCN reaction that required hospitalization no Has patient had a PCN reaction occurring within the last 10 years: no If all of the above answers are "NO", then may proceed with Cephalosporin  use.     HOME MEDICATIONS: Outpatient Medications Prior to Visit  Medication Sig Dispense Refill  . aspirin EC 81 MG EC tablet Take 1 tablet (81 mg total) by mouth daily. 30  tablet 1  . Calcium-Magnesium-Vitamin D (CALCIUM 1200+D3 PO) Take 1 tablet by mouth daily.    . cholecalciferol (VITAMIN D) 1000 UNITS tablet Take 1,000 Units by mouth daily.    . Coenzyme Q10 200 MG capsule Take 200 mg by mouth daily.    . digoxin (LANOXIN) 0.125 MG tablet Take 1 tablet (0.125 mg total) by mouth daily. 30 tablet 2  . magnesium oxide (MAG-OX) 400 MG tablet Take 400 mg by mouth daily.    Vladimir Faster Glycol-Propyl Glycol (SYSTANE) 0.4-0.3 % SOLN Apply 1 drop to eye 4 (four) times daily as needed.     . potassium chloride SA (K-DUR,KLOR-CON) 10 MEQ tablet Take 1 tablet (10 mEq total) by mouth daily. 90 tablet 3  . pravastatin (PRAVACHOL) 40 MG tablet Take 1 tablet (40 mg total) by mouth daily. 90 tablet 3  . spironolactone (ALDACTONE) 25 MG tablet Take 12.5 mg by mouth daily.    Marland Kitchen tiotropium (SPIRIVA) 18 MCG inhalation capsule Place 18 mcg into inhaler and inhale daily.    . furosemide (LASIX) 40 MG tablet Take 1 tablet (40 mg total) by mouth 2 (two) times daily. 60 tablet 1  . hydrocortisone (ANUSOL-HC) 2.5 % rectal cream Place 1 application rectally 2 (two) times daily. Place inside rectum twice daily for 7 days (Patient not taking: Reported on 12/07/2016) 30 g 1  . metoprolol tartrate (LOPRESSOR) 25 MG tablet Take 0.5 tablets (12.5 mg total) by mouth 2 (two) times daily. 30 tablet 3   No facility-administered medications prior to visit.     PAST MEDICAL HISTORY: Past Medical History:  Diagnosis Date  . Asthma   . Atrial fibrillation ([place])   . Benign neoplasm of colon 10/22/2005   Hyperplastic  & Adenomatous polyps   . Cardiomegaly 2013   by xray  . Diverticulosis of colon (without mention of hemorrhage)   . External hemorrhoids without mention of complication   . HLD (hyperlipidemia)   . HTN (hypertension)   . Malignant neoplasm of corpus uteri, except isthmus ([place])    followed by [name] with yearly visits  . Onychia and paronychia of toe   . Osteopenia 2017   by  xray, but DEXA WNL 2014  . Pneumonia   . Polycythemia 11/2011   normal EPO and periph smear  . Stroke [facility])     PAST SURGICAL HISTORY: Past Surgical History:  Procedure Laterality Date  . CATARACT EXTRACTION Bilateral   . COLONOSCOPY  10/22/05   polyps-bx negative; divertics, ext hemmorhoids  . COLONOSCOPY  03/2013   tubular adenomas, small angiodysplastic lesion at cecum, mod diverticulosis [facility])  . DEXA  2014   WNL  . GYNECOLOGIC CRYOSURGERY    . LUMBAR DISC SURGERY  1970s  . REFRACTIVE SURGERY Bilateral   . TONSILLECTOMY    . TOTAL ABDOMINAL HYSTERECTOMY W/ BILATERAL SALPINGOOPHORECTOMY  2005   uterine adenocarcinoma    FAMILY HISTORY: Family History  Problem Relation Age of Onset  . Hypertension Father   . Stroke Father   . Diabetes Mother   . Coronary artery disease Mother   . Hypertension Mother   . Colon cancer Neg Hx     SOCIAL HISTORY:  Social History   Social History  . Marital status: Widowed    Spouse name: N/A  .  Number of children: 3  . Years of education: N/A   Occupational History  . Retired Radiation protection practitioner    Social History Main Topics  . Smoking status: Former Smoker    Types: Cigarettes    Quit date: 09/21/1982  . Smokeless tobacco: Never Used  . Alcohol use No  . Drug use: No  . Sexual activity: Not on file   Other Topics Concern  . Not on file   Social History Narrative   Retired, widow, lives alone, caretakers in during day, family checks on her at night   One son Janeece Riggers - part time post office), one daughter ([place] Engineer, mining), other daughter Janeece Riggers)   Daily caffeine 1+     PHYSICAL EXAM  GENERAL EXAM/CONSTITUTIONAL: Vitals:  Vitals:   12/07/16 1304  BP: 104/60  Pulse: (!) 58  Weight: 149 lb 3.2 oz (67.7 kg)     Body mass index is 23.37 kg/m.  No exam data present  Patient is in no distress; well developed, nourished and groomed; neck is supple  FRAIL APPEARING  ON  OXYGEN  CARDIOVASCULAR:  Examination of carotid arteries is normal; no carotid bruits  Regular rate and rhythm, no murmurs  Examination of peripheral vascular system by observation and palpation is normal  EYES:  Ophthalmoscopic exam of optic discs and posterior segments is normal; no papilledema or hemorrhages  MUSCULOSKELETAL:  Gait, strength, tone, movements noted in Neurologic exam below  NEUROLOGIC: MENTAL STATUS:  No flowsheet data found.  awake, alert, oriented to person, place and time  [facility] memory   DECR attention and concentration  language fluent, comprehension intact, naming intact,   fund of knowledge appropriate  CRANIAL NERVE:   2nd - no papilledema on fundoscopic exam  2nd, 3rd, 4th, 6th - pupils equal and reactive to light, visual fields full to confrontation, extraocular muscles intact, no nystagmus  5th - facial sensation symmetric  7th - facial strength symmetric  8th - hearing intact  9th - palate elevates symmetrically, uvula midline  11th - shoulder shrug symmetric  12th - tongue protrusion midline  MILD DYSARTHRIA  MOTOR:   normal bulk and tone, full strength in the BUE, BLE  SENSORY:   normal and symmetric to light touch, temperature, vibration  COORDINATION:   finger-nose-finger, fine finger movements normal  REFLEXES:   deep tendon reflexes TRACE and symmetric  GAIT/STATION:   CAUTIOUS, SLOW GAIT; USES WALKER    DIAGNOSTIC DATA (LABS, IMAGING, TESTING) - I reviewed patient records, labs, notes, testing and imaging myself where available.  Lab Results  Component Value Date   WBC 5.7 12/02/2016   HGB 8.3 Repeated and verified X2. (L) 12/02/2016   HCT 27.0 (L) 12/02/2016   MCV 83.9 12/02/2016   PLT 214.0 12/02/2016      Component Value Date/Time   NA 141 12/02/2016 1128   K 3.9 12/02/2016 1128   CL 95 (L) 12/02/2016 1128   CO2 40 (H) 12/02/2016 1128   GLUCOSE 91 12/02/2016 1128   BUN 25 (H) 12/02/2016  1128   CREATININE 1.46 (H) 12/02/2016 1128   CALCIUM 10.2 12/02/2016 1128   PROT 6.5 12/02/2016 1128   ALBUMIN 3.4 (L) 12/02/2016 1128   AST 22 12/02/2016 1128   ALT 10 12/02/2016 1128   ALKPHOS 82 12/02/2016 1128   BILITOT 0.6 12/02/2016 1128   GFRNONAA 55 (L) 10/28/2016 0520   GFRAA >60 10/28/2016 0520   Lab Results  Component Value Date   CHOL 149 10/21/2016   HDL  78 10/21/2016   LDLCALC 59 10/21/2016   TRIG 62 10/21/2016   CHOLHDL 1.9 10/21/2016   Lab Results  Component Value Date   HGBA1C 5.7 (H) 10/21/2016   No results found for: VITAMINB12 Lab Results  Component Value Date   TSH 3.41 11/29/2012    10/23/16 CTA neck [I reviewed images myself and agree with interpretation. -VRP]  1. Ascending aortic 7 x 7 x 20 mm mural thrombus with potential for embolism. 2. Severe stenosis LEFT subclavian and LEFT vertebral artery origins. Mild stenosis RIGHT vertebral artery origin. 3. Severe atherosclerosis without hemodynamically significant stenosis of the internal carotid artery's. 4. Moderate canal stenosis C6-7. Severe C3-4 thru C6-7 neural foraminal narrowing.  10/21/16 MRI brain / MRA head [I reviewed images myself and agree with interpretation. -VRP]  1. No acute intracranial abnormality. 2. Mild chronic small vessel ischemic disease. 3. No major intracranial vessel occlusion or significant stenosis. 4. 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  10/22/16 TTE  - Normal LV systolic function; elevated LV filling pressure; D   shaped septum; biatrial enlargement; moderate RVE with moderately   reduced RV function; mild MR; severe TR with moderate to severe   elevation in pulmonary pressure.     ASSESSMENT AND PLAN  83 y.o. year old female here with suspected right brain TIA due to a ascending aortic mural thrombus, chronic atrial fibrillation, previously on coumadin (INR 2.15). Patient was discharged with eliquis anticoagulation to her skilled nursing facility, however  this was discontinued due to bright red blood per rectum. Now patient maintained on aspirin 81 mg daily.   Also with severe left subclavian stenosis.  Also with chronic congestive heart failure.  Also with oxygen dependence by nasal cannula.  Also with decreased mobility and high fall risk.   Dx:  1. TIA due to embolism ([place])   2. Chronic atrial fibrillation (HCC)   3. Thrombus of aorta ([place])   4. Chronic diastolic congestive heart failure (HCC)   5. Stenosis of left subclavian artery (HCC)      PLAN: I spent 45 minutes of face to face time with patient. Greater than 50% of time was spent in counseling and coordination of care with patient. In summary we discussed:  - safety and supervision issues reviewed: for example no driving and patient should not live alone - home palliative care consult (to establish goals of care, safety, supervision, MOST form, code status) - continue aspirin 81mg  daily  - we could consider restarting eliquis if cleared by GI (apparently eliquis was stopped due to GI bleeding at [facility]); however, this may not be safe to restart due to her currently living alone, her balance and cognitive issues, co-morbid medical issues  Orders Placed This Encounter  Procedures  . Amb Referral to Palliative Care   Return in about 3 months (around 03/09/2017), or if symptoms worsen or fail to improve, for return to PCP.    Penni Bombard, MD 1/91/4782, 9:56 PM Certified in Neurology, Neurophysiology and Neuroimaging  [facility] [address] [place], [place] [address] [phone]

## 2016-12-07 NOTE — Patient Instructions (Signed)
-   safety and supervision issues reviewed: for example no driving and patient should not live alone  - home palliative care consult (to establish goals of care, safety, supervision, MOST form, code status)  - continue aspirin 81mg  daily   - we could consider restarting eliquis (blood thinner) if cleared by gastroenterology (GI) (apparently eliquis was stopped due to GI bleeding at [facility]); however, this may not be safe to restart due to her currently living alone, her balance and cognitive issues, co-morbid medical issues

## 2016-12-08 ENCOUNTER — Telehealth: Payer: Self-pay | Admitting: *Deleted

## 2016-12-08 ENCOUNTER — Ambulatory Visit (INDEPENDENT_AMBULATORY_CARE_PROVIDER_SITE_OTHER): Payer: Medicare Other | Admitting: Family Medicine

## 2016-12-08 ENCOUNTER — Encounter: Payer: Self-pay | Admitting: Family Medicine

## 2016-12-08 VITALS — BP 112/62 | HR 93 | Temp 97.5°F | Wt 148.8 lb

## 2016-12-08 DIAGNOSIS — Z7901 Long term (current) use of anticoagulants: Secondary | ICD-10-CM

## 2016-12-08 DIAGNOSIS — L89322 Pressure ulcer of left buttock, stage 2: Secondary | ICD-10-CM | POA: Diagnosis not present

## 2016-12-08 DIAGNOSIS — D638 Anemia in other chronic diseases classified elsewhere: Secondary | ICD-10-CM

## 2016-12-08 DIAGNOSIS — I482 Chronic atrial fibrillation, unspecified: Secondary | ICD-10-CM

## 2016-12-08 DIAGNOSIS — N183 Chronic kidney disease, stage 3 unspecified: Secondary | ICD-10-CM

## 2016-12-08 DIAGNOSIS — G459 Transient cerebral ischemic attack, unspecified: Secondary | ICD-10-CM | POA: Diagnosis not present

## 2016-12-08 DIAGNOSIS — D62 Acute posthemorrhagic anemia: Secondary | ICD-10-CM | POA: Diagnosis not present

## 2016-12-08 DIAGNOSIS — Z8673 Personal history of transient ischemic attack (TIA), and cerebral infarction without residual deficits: Secondary | ICD-10-CM | POA: Diagnosis not present

## 2016-12-08 DIAGNOSIS — I7410 Embolism and thrombosis of unspecified parts of aorta: Secondary | ICD-10-CM

## 2016-12-08 DIAGNOSIS — M6281 Muscle weakness (generalized): Secondary | ICD-10-CM | POA: Diagnosis not present

## 2016-12-08 DIAGNOSIS — I749 Embolism and thrombosis of unspecified artery: Secondary | ICD-10-CM | POA: Diagnosis not present

## 2016-12-08 DIAGNOSIS — I5032 Chronic diastolic (congestive) heart failure: Secondary | ICD-10-CM | POA: Diagnosis not present

## 2016-12-08 DIAGNOSIS — R2689 Other abnormalities of gait and mobility: Secondary | ICD-10-CM | POA: Diagnosis not present

## 2016-12-08 DIAGNOSIS — I771 Stricture of artery: Secondary | ICD-10-CM

## 2016-12-08 DIAGNOSIS — I130 Hypertensive heart and chronic kidney disease with heart failure and stage 1 through stage 4 chronic kidney disease, or unspecified chronic kidney disease: Secondary | ICD-10-CM | POA: Diagnosis not present

## 2016-12-08 LAB — RENAL FUNCTION PANEL
Albumin: 3.6 g/dL (ref 3.5–5.2)
BUN: 22 mg/dL (ref 6–23)
CO2: 43 meq/L — AB (ref 19–32)
Calcium: 10.6 mg/dL — ABNORMAL HIGH (ref 8.4–10.5)
Chloride: 93 mEq/L — ABNORMAL LOW (ref 96–112)
Creatinine, Ser: 1.31 mg/dL — ABNORMAL HIGH (ref 0.40–1.20)
GFR: 40.75 mL/min — AB (ref >60.00)
Glucose, Bld: 90 mg/dL (ref 70–99)
PHOSPHORUS: 3.7 mg/dL (ref 2.3–4.6)
Potassium: 3.4 mEq/L — ABNORMAL LOW (ref 3.5–5.1)
Sodium: 140 mEq/L (ref 135–145)

## 2016-12-08 LAB — CBC WITH DIFFERENTIAL/PLATELET
BASOS ABS: 0.1 10*3/uL (ref 0.0–0.1)
BASOS PCT: 1.4 % (ref 0.0–3.0)
EOS PCT: 8.4 % — AB (ref 0.0–5.0)
Eosinophils Absolute: 0.5 10*3/uL (ref 0.0–0.7)
HEMATOCRIT: 27.8 % — AB (ref 36.0–46.0)
Hemoglobin: 8.5 g/dL — ABNORMAL LOW (ref 12.0–15.0)
LYMPHS PCT: 15.4 % (ref 12.0–46.0)
Lymphs Abs: 1 10*3/uL (ref 0.7–4.0)
MCHC: 30.8 g/dL (ref 30.0–36.0)
MCV: 81 fl (ref 78.0–100.0)
Monocytes Absolute: 1.3 10*3/uL — ABNORMAL HIGH (ref 0.1–1.0)
NEUTROS ABS: 3.4 10*3/uL (ref 1.4–7.7)
Neutrophils Relative %: 54.1 % (ref 43.0–77.0)
PLATELETS: 239 10*3/uL (ref 150.0–400.0)
RBC: 3.43 Mil/uL — ABNORMAL LOW (ref 3.87–5.11)
RDW: 17.3 % — AB (ref 11.5–15.5)
WBC: 6.2 10*3/uL (ref 4.0–10.5)

## 2016-12-08 MED ORDER — DIGOXIN 125 MCG PO TABS: 0.1250 mg | ORAL_TABLET | Freq: Every day | ORAL | 3 refills | Status: DC

## 2016-12-08 MED ORDER — GUAIFENESIN ER 600 MG PO TB12: 600.0000 mg | ORAL_TABLET | Freq: Two times a day (BID) | ORAL | 1 refills | Status: AC

## 2016-12-08 MED ORDER — TIOTROPIUM BROMIDE MONOHYDRATE 18 MCG IN CAPS: 18.0000 ug | ORAL_CAPSULE | Freq: Every day | RESPIRATORY_TRACT | 3 refills | Status: DC

## 2016-12-08 NOTE — Patient Instructions (Signed)
Repeat labs today.  We will order blood transfusion.  Stay off eliquis for now.  Return to see me in 2-3 weeks for follow up after blood transfusion.  I agree with palliative care evaluation.

## 2016-12-08 NOTE — Progress Notes (Signed)
Pre visit review using our clinic review tool, if applicable. No additional management support is needed unless otherwise documented below in the visit note. 

## 2016-12-08 NOTE — Telephone Encounter (Signed)
Order for transfusion in your IN box for completion.

## 2016-12-08 NOTE — Progress Notes (Signed)
BP 112/62   Pulse 93 Comment: irregular  Temp 97.5 F (36.4 C) (Oral)   Wt 148 lb 12 oz (67.5 kg)   SpO2 95% Comment: 2LPM O2  BMI 23.30 kg/m    CC: f/u visit Subjective:    Patient ID: Erica Bridges, female    DOB: 04/18/1927, 83 y.o.   MRN: [id number]  HPI: Erica Bridges is a 83 y.o. female presenting on 12/08/2016 for Follow-up (discuss anticoag)   Here with daughter Joelene Millin.   See recent office notes for details.  Saw Dr Deborra Medina last week for rehab f/u visit. Recent hospitalization 10/2016 for progressive dyspnea and TIA (L hand numbness and slurred speech) along with ascending aortic thrombus. Echo showed severe TR with elevated R heart pressures. CVTS recommended against surgery. Eliquis was started in place of coumadin for chronic afib and secondary stroke prevention. Discharged to [facility] re[facility] 10/29/2016. eliquis was stopped due to BRBPR, maintained solely on aspirin 81mg  daily. Has had UTI x2.   Discharged from [facility] re[facility] 11/25/2016. Living at home alone. Has caregiver 3 mornings a week. Daughter Maudie Mercury checks in on her every morning (works 2nd shift). [facility] involved - PT, OT, RN. No wound care involved yet.   Home palliative care referral placed yesterday by neuro Dr Leta Baptist.  Saw GI 11/26/2016 - noted buttock stage I/II decub ulcer. Treating with desitin. BRBPR thought due to hemorrhoids. Pt states she has not seen any recent bleeding.   Hgb 11.5 -> 10 -> 8.3 last week.   Denies chest pain, tightness, headaches. No syncope. Leg swelling has improved.  Ongoing dyspnea with exertion and dizziness with exertion.   COLONOSCOPY 03/2013 tubular adenomas, small angiodysplastic lesion at cecum, mod diverticulosis [facility])   Relevant past medical, surgical, family and social history reviewed and updated as indicated. Interim medical history since our last visit reviewed. Allergies and medications reviewed and updated. Outpatient Medications Prior to Visit  Medication Sig  Dispense Refill  . aspirin EC 81 MG EC tablet Take 1 tablet (81 mg total) by mouth daily. 30 tablet 1  . cholecalciferol (VITAMIN D) 1000 UNITS tablet Take 1,000 Units by mouth daily.    . Coenzyme Q10 200 MG capsule Take 200 mg by mouth daily.    . furosemide (LASIX) 40 MG tablet Take 1 tablet (40 mg total) by mouth 2 (two) times daily. 60 tablet 1  . magnesium oxide (MAG-OX) 400 MG tablet Take 400 mg by mouth daily.    . metoprolol tartrate (LOPRESSOR) 25 MG tablet Take 0.5 tablets (12.5 mg total) by mouth 2 (two) times daily. 30 tablet 3  . potassium chloride SA (K-DUR,KLOR-CON) 10 MEQ tablet Take 1 tablet (10 mEq total) by mouth daily. 90 tablet 3  . pravastatin (PRAVACHOL) 40 MG tablet Take 1 tablet (40 mg total) by mouth daily. 90 tablet 3  . spironolactone (ALDACTONE) 25 MG tablet Take 12.5 mg by mouth daily.    . digoxin (LANOXIN) 0.125 MG tablet Take 1 tablet (0.125 mg total) by mouth daily. 30 tablet 2  . senna (SENOKOT) 8.6 MG TABS tablet Take 1 tablet by mouth as needed for mild constipation.    Marland Kitchen tiotropium (SPIRIVA) 18 MCG inhalation capsule Place 18 mcg into inhaler and inhale daily.    . Calcium-Magnesium-Vitamin D (CALCIUM 1200+D3 PO) Take 1 tablet by mouth daily.    . hydrocortisone (ANUSOL-HC) 2.5 % rectal cream Place 1 application rectally 2 (two) times daily. Place inside rectum twice daily for 7 days (  Patient not taking: Reported on 12/07/2016) 30 g 1  . Polyethyl Glycol-Propyl Glycol (SYSTANE) 0.4-0.3 % SOLN Apply 1 drop to eye 4 (four) times daily as needed.      No facility-administered medications prior to visit.      Per HPI unless specifically indicated in ROS section below Review of Systems     Objective:    BP 112/62   Pulse 93 Comment: irregular  Temp 97.5 F (36.4 C) (Oral)   Wt 148 lb 12 oz (67.5 kg)   SpO2 95% Comment: 2LPM O2  BMI 23.30 kg/m   Wt Readings from Last 3 Encounters:  12/08/16 148 lb 12 oz (67.5 kg)  12/07/16 149 lb 3.2 oz (67.7 kg)    12/02/16 157 lb (71.2 kg)    Physical Exam  Constitutional: She appears well-developed and well-nourished. No distress.  Comes in using walker today  HENT:  Mouth/Throat: Oropharynx is clear and moist. No oropharyngeal exudate.  Eyes: Conjunctivae are normal. Pupils are equal, round, and reactive to light.  Cardiovascular: Normal rate, regular rhythm and intact distal pulses.   Murmur (flow systolic murmur) heard. Pulmonary/Chest: Effort normal and breath sounds normal. No respiratory distress. She has no wheezes. She has no rales.  Musculoskeletal: She exhibits edema (mild nonpitting).  Skin: There is pallor.  Perianal and medial buttock region with soft stool no obvious blood  L medial buttock <1cm small erythematous healing sacral ulcer without skin opening.   Nursing note and vitals reviewed.  Results for orders placed or performed in visit on 12/08/16  Renal function panel  Result Value Ref Range   Sodium 140 135 - 145 mEq/L   Potassium 3.4 (L) 3.5 - 5.1 mEq/L   Chloride 93 (L) 96 - 112 mEq/L   CO2 43 (H) 19 - 32 mEq/L   Calcium 10.6 (H) 8.4 - 10.5 mg/dL   Albumin 3.6 3.5 - 5.2 g/dL   BUN 22 6 - 23 mg/dL   Creatinine, Ser 1.31 (H) 0.40 - 1.20 mg/dL   Glucose, Bld 90 70 - 99 mg/dL   Phosphorus 3.7 2.3 - 4.6 mg/dL   GFR 40.75 (L) >60.00 mL/min  CBC with Differential/Platelet  Result Value Ref Range   WBC 6.2 4.0 - 10.5 K/uL   RBC 3.43 (L) 3.87 - 5.11 Mil/uL   Hemoglobin 8.5 Repeated and verified X2. (L) 12.0 - 15.0 g/dL   HCT 27.8 (L) 36.0 - 46.0 %   MCV 81.0 78.0 - 100.0 fl   MCHC 30.8 30.0 - 36.0 g/dL   RDW 17.3 (H) 11.5 - 15.5 %   Platelets 239.0 150.0 - 400.0 K/uL   Neutrophils Relative % 54.1 43.0 - 77.0 %   Lymphocytes Relative 15.4 12.0 - 46.0 %   Monocytes Relative 20.7 Repeated and verified X2. (H) 3.0 - 12.0 %   Eosinophils Relative 8.4 (H) 0.0 - 5.0 %   Basophils Relative 1.4 0.0 - 3.0 %   Neutro Abs 3.4 1.4 - 7.7 K/uL   Lymphs Abs 1.0 0.7 - 4.0 K/uL    Monocytes Absolute 1.3 (H) 0.1 - 1.0 K/uL   Eosinophils Absolute 0.5 0.0 - 0.7 K/uL   Basophils Absolute 0.1 0.0 - 0.1 K/uL      Assessment & Plan:   Problem List Items Addressed This Visit    Anemia    New anemia over the last few months in setting of BRBPR attributed by GI to hemorrhoidal bleeding. Hgb 13.3 (10/21/2016). Will update CBC today.  She does  endorse symptoms attributable to new anemia - dyspnea and orthostatic dizziness - so will order 1 u pRBC transfusion - advised to take lasix 40mg  right after transfusion given CHF history.  RTC 2-3 wks for f/u visit to recheck labs, consider addition of oral iron at that time.  Pt agrees with plan.       Relevant Orders   CBC with Differential/Platelet (Completed)   Aortic mural thrombus [facility])    Discussed with patient. Not a surgical candidate. Only on aspirin due to recent GI bleed. Will not restart due to ongoing anemia.       Relevant Medications   digoxin (LANOXIN) 0.125 MG tablet   Atrial fibrillation (HCC)    Chronic. Prior on coumadin, changed to eliquis in hospital due to atrial thrombus while therapeutic. Now only on aspirin after GI bleed in hospital.       Relevant Medications   digoxin (LANOXIN) 0.125 MG tablet   Chronic anticoagulation    Off AC at this time due to anemia with recent GI bleed.      Chronic diastolic CHF (congestive heart failure) (HCC)    New oxygen requirement. Seems relatively euvolemic today.  Overall 9 lbs down since last visit at our office. Continue lasix 40mg  bid and spironolactone 12.5mg  daily.       Relevant Medications   digoxin (LANOXIN) 0.125 MG tablet   CKD (chronic kidney disease), stage III    Chronic, but worse over the last month.  Update renal panel today.       Relevant Orders   Renal function panel (Completed)   Decubitus ulcer of buttock, stage 2 - Primary    New healing L buttock ulcer, in setting of new fecal incontinence. Will ask wound care nurse to come to  house for evaluation/management to ensure continues healing. Continue diaper use.       Stenosis of left subclavian artery (HCC)    Only on aspirin anticoagulation.       Relevant Medications   digoxin (LANOXIN) 0.125 MG tablet   TIA due to embolism ([place])    Saw neurology this week, note reviewed. Referred to in home palliative care for consult.       Relevant Medications   digoxin (LANOXIN) 0.125 MG tablet       Follow up plan: Return in about 2 weeks (around 12/22/2016) for follow up visit.  Ria Bush, MD

## 2016-12-09 NOTE — Telephone Encounter (Signed)
Erica Bridges called checking on information about transfusion  [phone]  Cell [phone]

## 2016-12-09 NOTE — Telephone Encounter (Signed)
Form faxed back to requested party

## 2016-12-09 NOTE — Telephone Encounter (Signed)
Filled and in Kim's box. 

## 2016-12-09 NOTE — Telephone Encounter (Signed)
Order isn't completed yet, so it hasn't been faxed. I will let her know when it's done.

## 2016-12-10 ENCOUNTER — Telehealth: Payer: Self-pay | Admitting: Family Medicine

## 2016-12-10 DIAGNOSIS — I5032 Chronic diastolic (congestive) heart failure: Secondary | ICD-10-CM | POA: Diagnosis not present

## 2016-12-10 DIAGNOSIS — R2689 Other abnormalities of gait and mobility: Secondary | ICD-10-CM | POA: Diagnosis not present

## 2016-12-10 DIAGNOSIS — Z8673 Personal history of transient ischemic attack (TIA), and cerebral infarction without residual deficits: Secondary | ICD-10-CM | POA: Diagnosis not present

## 2016-12-10 DIAGNOSIS — L89302 Pressure ulcer of unspecified buttock, stage 2: Secondary | ICD-10-CM | POA: Insufficient documentation

## 2016-12-10 DIAGNOSIS — D649 Anemia, unspecified: Secondary | ICD-10-CM | POA: Insufficient documentation

## 2016-12-10 DIAGNOSIS — M6281 Muscle weakness (generalized): Secondary | ICD-10-CM | POA: Diagnosis not present

## 2016-12-10 DIAGNOSIS — N183 Chronic kidney disease, stage 3 (moderate): Secondary | ICD-10-CM | POA: Diagnosis not present

## 2016-12-10 DIAGNOSIS — I130 Hypertensive heart and chronic kidney disease with heart failure and stage 1 through stage 4 chronic kidney disease, or unspecified chronic kidney disease: Secondary | ICD-10-CM | POA: Diagnosis not present

## 2016-12-10 NOTE — Assessment & Plan Note (Addendum)
New anemia over the last few months in setting of BRBPR attributed by GI to hemorrhoidal bleeding. Hgb 13.3 (10/21/2016). Will update CBC today.  She does endorse symptoms attributable to new anemia - dyspnea and orthostatic dizziness - so will order 1 u pRBC transfusion - advised to take lasix 40mg  right after transfusion given CHF history.  RTC 2-3 wks for f/u visit to recheck labs, consider addition of oral iron at that time.  Pt agrees with plan.

## 2016-12-10 NOTE — Assessment & Plan Note (Signed)
Off AC at this time due to anemia with recent GI bleed.

## 2016-12-10 NOTE — Assessment & Plan Note (Addendum)
Chronic, but worse over the last month.  Update renal panel today.

## 2016-12-10 NOTE — Assessment & Plan Note (Signed)
Only on aspirin anticoagulation.

## 2016-12-10 NOTE — Assessment & Plan Note (Signed)
Chronic. Prior on coumadin, changed to eliquis in hospital due to atrial thrombus while therapeutic. Now only on aspirin after GI bleed in hospital.

## 2016-12-10 NOTE — Telephone Encounter (Signed)
If pt calls asking for [facility] after she leaves today @ 3.  Tell her to call [facility] tomorrow 3/23 @ [phone]. Rosaria Ferries has all the information

## 2016-12-10 NOTE — Assessment & Plan Note (Addendum)
New healing L buttock ulcer, in setting of new fecal incontinence. Will ask wound care nurse to come to house for evaluation/management to ensure continues healing. Continue diaper use.

## 2016-12-10 NOTE — Assessment & Plan Note (Signed)
New oxygen requirement. Seems relatively euvolemic today.  Overall 9 lbs down since last visit at our office. Continue lasix 40mg  bid and spironolactone 12.5mg  daily.

## 2016-12-10 NOTE — Assessment & Plan Note (Signed)
Discussed with patient. Not a surgical candidate. Only on aspirin due to recent GI bleed. Will not restart due to ongoing anemia.

## 2016-12-10 NOTE — Assessment & Plan Note (Signed)
Saw neurology this week, note reviewed. Referred to in home palliative care for consult.

## 2016-12-10 NOTE — Telephone Encounter (Signed)
Message left advising patient's daughter to call short stay and schedule appt when convenient. Contact info provided.

## 2016-12-10 NOTE — Addendum Note (Signed)
Addended by: Ria Bush on: 12/10/2016 01:12 PM   Modules accepted: Orders

## 2016-12-11 DIAGNOSIS — R2689 Other abnormalities of gait and mobility: Secondary | ICD-10-CM | POA: Diagnosis not present

## 2016-12-11 DIAGNOSIS — N183 Chronic kidney disease, stage 3 (moderate): Secondary | ICD-10-CM | POA: Diagnosis not present

## 2016-12-11 DIAGNOSIS — I5032 Chronic diastolic (congestive) heart failure: Secondary | ICD-10-CM | POA: Diagnosis not present

## 2016-12-11 DIAGNOSIS — M6281 Muscle weakness (generalized): Secondary | ICD-10-CM | POA: Diagnosis not present

## 2016-12-11 DIAGNOSIS — I130 Hypertensive heart and chronic kidney disease with heart failure and stage 1 through stage 4 chronic kidney disease, or unspecified chronic kidney disease: Secondary | ICD-10-CM | POA: Diagnosis not present

## 2016-12-11 DIAGNOSIS — Z8673 Personal history of transient ischemic attack (TIA), and cerebral infarction without residual deficits: Secondary | ICD-10-CM | POA: Diagnosis not present

## 2016-12-12 ENCOUNTER — Other Ambulatory Visit: Payer: Self-pay | Admitting: Family Medicine

## 2016-12-12 MED ORDER — FERROUS SULFATE 325 (65 FE) MG PO TABS: 325.0000 mg | ORAL_TABLET | Freq: Every day | ORAL | Status: AC

## 2016-12-14 ENCOUNTER — Other Ambulatory Visit (HOSPITAL_COMMUNITY): Admission: RE | Admit: 2016-12-14 | Payer: Medicare Other | Source: Ambulatory Visit

## 2016-12-14 DIAGNOSIS — D649 Anemia, unspecified: Secondary | ICD-10-CM | POA: Diagnosis not present

## 2016-12-14 LAB — PREPARE RBC (CROSSMATCH)

## 2016-12-14 LAB — ABO/RH: ABO/RH(D): B POS

## 2016-12-15 ENCOUNTER — Ambulatory Visit (HOSPITAL_COMMUNITY)
Admission: RE | Admit: 2016-12-15 | Discharge: 2016-12-15 | Disposition: A | Discharge status: Home / Self Care | Payer: Medicare Other | Source: Ambulatory Visit | Attending: Family Medicine | Admitting: Family Medicine

## 2016-12-15 ENCOUNTER — Other Ambulatory Visit (HOSPITAL_COMMUNITY): Payer: Self-pay | Admitting: Family Medicine

## 2016-12-15 ENCOUNTER — Encounter (HOSPITAL_COMMUNITY): Payer: Self-pay

## 2016-12-15 DIAGNOSIS — D649 Anemia, unspecified: Secondary | ICD-10-CM | POA: Insufficient documentation

## 2016-12-15 MED ORDER — SODIUM CHLORIDE 0.9 % IV SOLN
Freq: Once | INTRAVENOUS | Status: AC
  Administered 2016-12-15: 08:00:00 via INTRAVENOUS

## 2016-12-15 MED ORDER — FUROSEMIDE 40 MG PO TABS
40.0000 mg | ORAL_TABLET | Freq: Once | ORAL | Status: AC
  Administered 2016-12-15: 40 mg via ORAL
  Filled 2016-12-15: qty 1

## 2016-12-15 NOTE — Progress Notes (Signed)
Patient receiving blood transfusion. Tolerating well. Patient took her home meds this am before coming (including 40mg  Lasix which she takes at 0700 and 1700). Order for her to take after the blood transfusion here. Call placed to Dr. Bosie Clos office and clarified order. Per Maudie Mercury (at MD office) she is to take 40mg  po lasix that is ordered when blood transfusion complete (approximately btw 11am-12noon). Instruct patient and family NOT to take her normal 1700 dose this evening at home.

## 2016-12-15 NOTE — Progress Notes (Signed)
Patient tolerated blood transfusion well. Home with daughter. Aware of d/c instructions.

## 2016-12-15 NOTE — Discharge Instructions (Signed)

## 2016-12-16 DIAGNOSIS — R531 Weakness: Secondary | ICD-10-CM | POA: Diagnosis not present

## 2016-12-16 DIAGNOSIS — Z8673 Personal history of transient ischemic attack (TIA), and cerebral infarction without residual deficits: Secondary | ICD-10-CM | POA: Diagnosis not present

## 2016-12-16 DIAGNOSIS — R2689 Other abnormalities of gait and mobility: Secondary | ICD-10-CM | POA: Diagnosis not present

## 2016-12-16 DIAGNOSIS — I5032 Chronic diastolic (congestive) heart failure: Secondary | ICD-10-CM | POA: Diagnosis not present

## 2016-12-16 DIAGNOSIS — I130 Hypertensive heart and chronic kidney disease with heart failure and stage 1 through stage 4 chronic kidney disease, or unspecified chronic kidney disease: Secondary | ICD-10-CM | POA: Diagnosis not present

## 2016-12-16 DIAGNOSIS — M6281 Muscle weakness (generalized): Secondary | ICD-10-CM | POA: Diagnosis not present

## 2016-12-16 DIAGNOSIS — N183 Chronic kidney disease, stage 3 (moderate): Secondary | ICD-10-CM | POA: Diagnosis not present

## 2016-12-16 LAB — TYPE AND SCREEN
ABO/RH(D): B POS
Antibody Screen: NEGATIVE
Unit division: 0

## 2016-12-16 LAB — BPAM RBC
BLOOD PRODUCT EXPIRATION DATE: 201804172359
ISSUE DATE / TIME: 201803270822
UNIT TYPE AND RH: 7300

## 2016-12-17 DIAGNOSIS — N183 Chronic kidney disease, stage 3 (moderate): Secondary | ICD-10-CM | POA: Diagnosis not present

## 2016-12-17 DIAGNOSIS — I130 Hypertensive heart and chronic kidney disease with heart failure and stage 1 through stage 4 chronic kidney disease, or unspecified chronic kidney disease: Secondary | ICD-10-CM | POA: Diagnosis not present

## 2016-12-17 DIAGNOSIS — M6281 Muscle weakness (generalized): Secondary | ICD-10-CM | POA: Diagnosis not present

## 2016-12-17 DIAGNOSIS — R2689 Other abnormalities of gait and mobility: Secondary | ICD-10-CM | POA: Diagnosis not present

## 2016-12-17 DIAGNOSIS — Z8673 Personal history of transient ischemic attack (TIA), and cerebral infarction without residual deficits: Secondary | ICD-10-CM | POA: Diagnosis not present

## 2016-12-17 DIAGNOSIS — I5032 Chronic diastolic (congestive) heart failure: Secondary | ICD-10-CM | POA: Diagnosis not present

## 2016-12-18 DIAGNOSIS — R2689 Other abnormalities of gait and mobility: Secondary | ICD-10-CM | POA: Diagnosis not present

## 2016-12-18 DIAGNOSIS — Z8673 Personal history of transient ischemic attack (TIA), and cerebral infarction without residual deficits: Secondary | ICD-10-CM | POA: Diagnosis not present

## 2016-12-18 DIAGNOSIS — M6281 Muscle weakness (generalized): Secondary | ICD-10-CM | POA: Diagnosis not present

## 2016-12-18 DIAGNOSIS — I130 Hypertensive heart and chronic kidney disease with heart failure and stage 1 through stage 4 chronic kidney disease, or unspecified chronic kidney disease: Secondary | ICD-10-CM | POA: Diagnosis not present

## 2016-12-18 DIAGNOSIS — I5032 Chronic diastolic (congestive) heart failure: Secondary | ICD-10-CM | POA: Diagnosis not present

## 2016-12-18 DIAGNOSIS — N183 Chronic kidney disease, stage 3 (moderate): Secondary | ICD-10-CM | POA: Diagnosis not present

## 2016-12-21 DIAGNOSIS — M6281 Muscle weakness (generalized): Secondary | ICD-10-CM | POA: Diagnosis not present

## 2016-12-21 DIAGNOSIS — R2689 Other abnormalities of gait and mobility: Secondary | ICD-10-CM | POA: Diagnosis not present

## 2016-12-21 DIAGNOSIS — Z8673 Personal history of transient ischemic attack (TIA), and cerebral infarction without residual deficits: Secondary | ICD-10-CM | POA: Diagnosis not present

## 2016-12-21 DIAGNOSIS — I130 Hypertensive heart and chronic kidney disease with heart failure and stage 1 through stage 4 chronic kidney disease, or unspecified chronic kidney disease: Secondary | ICD-10-CM | POA: Diagnosis not present

## 2016-12-21 DIAGNOSIS — N183 Chronic kidney disease, stage 3 (moderate): Secondary | ICD-10-CM | POA: Diagnosis not present

## 2016-12-21 DIAGNOSIS — I5032 Chronic diastolic (congestive) heart failure: Secondary | ICD-10-CM | POA: Diagnosis not present

## 2016-12-22 ENCOUNTER — Telehealth: Payer: Self-pay

## 2016-12-22 DIAGNOSIS — M6281 Muscle weakness (generalized): Secondary | ICD-10-CM | POA: Diagnosis not present

## 2016-12-22 DIAGNOSIS — N183 Chronic kidney disease, stage 3 (moderate): Secondary | ICD-10-CM | POA: Diagnosis not present

## 2016-12-22 DIAGNOSIS — I130 Hypertensive heart and chronic kidney disease with heart failure and stage 1 through stage 4 chronic kidney disease, or unspecified chronic kidney disease: Secondary | ICD-10-CM | POA: Diagnosis not present

## 2016-12-22 DIAGNOSIS — I5032 Chronic diastolic (congestive) heart failure: Secondary | ICD-10-CM | POA: Diagnosis not present

## 2016-12-22 DIAGNOSIS — R2689 Other abnormalities of gait and mobility: Secondary | ICD-10-CM | POA: Diagnosis not present

## 2016-12-22 DIAGNOSIS — Z8673 Personal history of transient ischemic attack (TIA), and cerebral infarction without residual deficits: Secondary | ICD-10-CM | POA: Diagnosis not present

## 2016-12-22 NOTE — Telephone Encounter (Signed)
Agree. Thanks

## 2016-12-22 NOTE — Telephone Encounter (Signed)
[place] with [facility] left v/m wanting to extend [facility] nursing for 3 more weeks. Pt had blood transfusion last week.Pt has appt with Dr Darnell Level on 01/04/17.

## 2016-12-22 NOTE — Telephone Encounter (Signed)
Verbal order given to [facility]

## 2016-12-23 DIAGNOSIS — I5032 Chronic diastolic (congestive) heart failure: Secondary | ICD-10-CM | POA: Diagnosis not present

## 2016-12-23 DIAGNOSIS — Z8673 Personal history of transient ischemic attack (TIA), and cerebral infarction without residual deficits: Secondary | ICD-10-CM | POA: Diagnosis not present

## 2016-12-23 DIAGNOSIS — I130 Hypertensive heart and chronic kidney disease with heart failure and stage 1 through stage 4 chronic kidney disease, or unspecified chronic kidney disease: Secondary | ICD-10-CM | POA: Diagnosis not present

## 2016-12-23 DIAGNOSIS — R2689 Other abnormalities of gait and mobility: Secondary | ICD-10-CM | POA: Diagnosis not present

## 2016-12-23 DIAGNOSIS — M6281 Muscle weakness (generalized): Secondary | ICD-10-CM | POA: Diagnosis not present

## 2016-12-23 DIAGNOSIS — N183 Chronic kidney disease, stage 3 (moderate): Secondary | ICD-10-CM | POA: Diagnosis not present

## 2016-12-24 DIAGNOSIS — I130 Hypertensive heart and chronic kidney disease with heart failure and stage 1 through stage 4 chronic kidney disease, or unspecified chronic kidney disease: Secondary | ICD-10-CM | POA: Diagnosis not present

## 2016-12-24 DIAGNOSIS — Z8673 Personal history of transient ischemic attack (TIA), and cerebral infarction without residual deficits: Secondary | ICD-10-CM | POA: Diagnosis not present

## 2016-12-24 DIAGNOSIS — M6281 Muscle weakness (generalized): Secondary | ICD-10-CM | POA: Diagnosis not present

## 2016-12-24 DIAGNOSIS — I5032 Chronic diastolic (congestive) heart failure: Secondary | ICD-10-CM | POA: Diagnosis not present

## 2016-12-24 DIAGNOSIS — N183 Chronic kidney disease, stage 3 (moderate): Secondary | ICD-10-CM | POA: Diagnosis not present

## 2016-12-24 DIAGNOSIS — R2689 Other abnormalities of gait and mobility: Secondary | ICD-10-CM | POA: Diagnosis not present

## 2016-12-25 ENCOUNTER — Ambulatory Visit (INDEPENDENT_AMBULATORY_CARE_PROVIDER_SITE_OTHER): Payer: Medicare Other | Admitting: Family Medicine

## 2016-12-25 ENCOUNTER — Encounter: Payer: Self-pay | Admitting: Family Medicine

## 2016-12-25 VITALS — BP 120/82 | Wt 138.0 lb

## 2016-12-25 DIAGNOSIS — Z7901 Long term (current) use of anticoagulants: Secondary | ICD-10-CM

## 2016-12-25 DIAGNOSIS — D638 Anemia in other chronic diseases classified elsewhere: Secondary | ICD-10-CM

## 2016-12-25 DIAGNOSIS — I5032 Chronic diastolic (congestive) heart failure: Secondary | ICD-10-CM | POA: Diagnosis not present

## 2016-12-25 DIAGNOSIS — L89322 Pressure ulcer of left buttock, stage 2: Secondary | ICD-10-CM | POA: Diagnosis not present

## 2016-12-25 DIAGNOSIS — I7410 Embolism and thrombosis of unspecified parts of aorta: Secondary | ICD-10-CM | POA: Diagnosis not present

## 2016-12-25 DIAGNOSIS — N183 Chronic kidney disease, stage 3 unspecified: Secondary | ICD-10-CM

## 2016-12-25 DIAGNOSIS — I482 Chronic atrial fibrillation, unspecified: Secondary | ICD-10-CM

## 2016-12-25 LAB — CBC WITH DIFFERENTIAL/PLATELET
BASOS PCT: 1 %
Basophils Absolute: 73 cells/uL (ref 0–200)
EOS ABS: 584 {cells}/uL — AB (ref 15–500)
Eosinophils Relative: 8 %
HEMATOCRIT: 34.9 % — AB (ref 35.0–45.0)
HEMOGLOBIN: 10.5 g/dL — AB (ref 11.7–15.5)
LYMPHS ABS: 1095 {cells}/uL (ref 850–3900)
LYMPHS PCT: 15 %
MCH: 24.6 pg — ABNORMAL LOW (ref 27.0–33.0)
MCHC: 30.1 g/dL — ABNORMAL LOW (ref 32.0–36.0)
MCV: 81.9 fL (ref 80.0–100.0)
MONO ABS: 1168 {cells}/uL — AB (ref 200–950)
MPV: 10 fL (ref 7.5–12.5)
Monocytes Relative: 16 %
Neutro Abs: 4380 cells/uL (ref 1500–7800)
Neutrophils Relative %: 60 %
Platelets: 227 10*3/uL (ref 140–400)
RBC: 4.26 MIL/uL (ref 3.80–5.10)
RDW: 19 % — ABNORMAL HIGH (ref 11.0–15.0)
WBC: 7.3 10*3/uL (ref 3.8–10.8)

## 2016-12-25 LAB — RENAL FUNCTION PANEL
Albumin: 3.5 g/dL — ABNORMAL LOW (ref 3.6–5.1)
BUN: 19 mg/dL (ref 7–25)
CALCIUM: 9.6 mg/dL (ref 8.6–10.4)
CHLORIDE: 91 mmol/L — AB (ref 98–110)
CO2: 37 mmol/L — AB (ref 20–31)
Creat: 1.13 mg/dL — ABNORMAL HIGH (ref 0.60–0.88)
GLUCOSE: 104 mg/dL — AB (ref 65–99)
PHOSPHORUS: 3.6 mg/dL (ref 2.1–4.3)
Potassium: 3.6 mmol/L (ref 3.5–5.3)
Sodium: 139 mmol/L (ref 135–146)

## 2016-12-25 MED ORDER — METOPROLOL TARTRATE 25 MG PO TABS: 12.5000 mg | ORAL_TABLET | Freq: Two times a day (BID) | ORAL | 3 refills | Status: AC

## 2016-12-25 MED ORDER — SPIRONOLACTONE 25 MG PO TABS: 12.5000 mg | ORAL_TABLET | Freq: Every day | ORAL | 3 refills | Status: DC

## 2016-12-25 MED ORDER — DIGOXIN 125 MCG PO TABS: 0.1250 mg | ORAL_TABLET | Freq: Every day | ORAL | 1 refills | Status: AC

## 2016-12-25 MED ORDER — TIOTROPIUM BROMIDE MONOHYDRATE 18 MCG IN CAPS: 18.0000 ug | ORAL_CAPSULE | Freq: Every day | RESPIRATORY_TRACT | 3 refills | Status: AC

## 2016-12-25 MED ORDER — FUROSEMIDE 40 MG PO TABS: 40.0000 mg | ORAL_TABLET | Freq: Two times a day (BID) | ORAL | 3 refills | Status: DC

## 2016-12-25 MED ORDER — PRAVASTATIN SODIUM 40 MG PO TABS: 40.0000 mg | ORAL_TABLET | Freq: Every day | ORAL | 3 refills | Status: AC

## 2016-12-25 MED ORDER — POTASSIUM CHLORIDE CRYS ER 10 MEQ PO TBCR: 10.0000 meq | EXTENDED_RELEASE_TABLET | Freq: Every day | ORAL | 3 refills | Status: AC

## 2016-12-25 NOTE — Patient Instructions (Signed)
Continue current medicines. Labwork today.  Return in 1 month for follow up visit.

## 2016-12-25 NOTE — Progress Notes (Signed)
Pre visit review using our clinic review tool, if applicable. No additional management support is needed unless otherwise documented below in the visit note. 

## 2016-12-25 NOTE — Progress Notes (Signed)
BP 120/82 (BP Location: Left Arm, Patient Position: Sitting, Cuff Size: Normal)   Wt 138 lb (62.6 kg)   SpO2 95% Comment: 2LNC  BMI 21.61 kg/m    CC: f/u anemia Subjective:    Patient ID: Erica Bridges, female    DOB: 22-Jun-1927, 83 y.o.   MRN: [id number]  HPI: Erica Bridges is a 83 y.o. female presenting on 12/25/2016 for Follow-up (Anemia)   See prior note for details. Recent hospitalization for progressive dyspnea and TIA, right heart failure with diastolic CHF and new oxygen requirement, found to have ascending aortic thrombus despite coumadin use. Started on eliquis. However due to progressive anemia and concern for GI bleed (thought hemorrhoidal), eliquis was held. Only on aspirin currently. She received 1u pRBC last week for symptomatic anemia (dyspnea and dizziness). Here for f/u after transfusion. Feels significantly better after transfusion - improved symptoms. Weight down 10 lbs. She is compliant with lasix 70m bid and spironolactone 12.563mdaily.  Daughter thinks appetite is down and has concerns with nutrition status.  Patient has started iron 32527maily. She has also started using cast iron frying pan.   HH involved. Released from OT and PT. Continues seeing RN for a few more week. Recommended nutrition classes, pending.   Relevant past medical, surgical, family and social history reviewed and updated as indicated. Interim medical history since our last visit reviewed. Allergies and medications reviewed and updated. Outpatient Medications Prior to Visit  Medication Sig Dispense Refill  . aspirin EC 81 MG EC tablet Take 1 tablet (81 mg total) by mouth daily. 30 tablet 1  . Calcium-Magnesium-Vitamin D (CALCIUM 1200+D3 PO) Take 1 tablet by mouth daily.    . cholecalciferol (VITAMIN D) 1000 UNITS tablet Take 1,000 Units by mouth daily.    . Coenzyme Q10 200 MG capsule Take 200 mg by mouth daily.    . ferrous sulfate 325 (65 FE) MG tablet Take 1 tablet (325 mg total) by mouth  daily with breakfast.    . guaiFENesin (MUCINEX) 600 MG 12 hr tablet Take 1 tablet (600 mg total) by mouth 2 (two) times daily. 60 tablet 1  . magnesium oxide (MAG-OX) 400 MG tablet Take 400 mg by mouth daily.    . digoxin (LANOXIN) 0.125 MG tablet Take 1 tablet (0.125 mg total) by mouth daily. 30 tablet 3  . furosemide (LASIX) 40 MG tablet Take 1 tablet (40 mg total) by mouth 2 (two) times daily. 60 tablet 1  . metoprolol tartrate (LOPRESSOR) 25 MG tablet Take 0.5 tablets (12.5 mg total) by mouth 2 (two) times daily. 30 tablet 3  . potassium chloride SA (K-DUR,KLOR-CON) 10 MEQ tablet Take 1 tablet (10 mEq total) by mouth daily. 90 tablet 3  . pravastatin (PRAVACHOL) 40 MG tablet Take 1 tablet (40 mg total) by mouth daily. 90 tablet 3  . spironolactone (ALDACTONE) 25 MG tablet Take 12.5 mg by mouth daily.    . t[place]land Kitchenotropium (SPIRIVA) 18 MCG inhalation capsule Place 1 capsule (18 mcg total) into inhaler and inhale daily. 30 capsule 3   No facility-administered medications prior to visit.      Per HPI unless specifically indicated in ROS section below Review of Systems     Objective:    BP 120/82 (BP Location: Left Arm, Patient Position: Sitting, Cuff Size: Normal)   Wt 138 lb (62.6 kg)   SpO2 95% Comment: 2LNC  BMI 21.61 kg/m   Wt Readings from Last 3 Encounters:  12/25/16 138 lb (  62.6 kg)  12/15/16 148 lb 12 oz (67.5 kg)  12/08/16 148 lb 12 oz (67.5 kg)    Physical Exam  Constitutional: She appears well-developed and well-nourished. No distress.  HENT:  Mouth/Throat: Oropharynx is clear and moist. No oropharyngeal exudate.  Cardiovascular: Normal rate, regular rhythm, normal heart sounds and intact distal pulses.   No murmur heard. Pulmonary/Chest: Effort normal and breath sounds normal. No respiratory distress. She has no wheezes. She has no rales.  Musculoskeletal: She exhibits no edema.  Skin: Skin is warm and dry. No rash noted.  Psychiatric: She has a normal mood and affect.    Nursing note and vitals reviewed.  Results for orders placed or performed in visit on 12/25/16  CBC with Differential/Platelet  Result Value Ref Range   WBC 7.3 3.8 - 10.8 K/uL   RBC 4.26 3.80 - 5.10 MIL/uL   Hemoglobin 10.5 (L) 11.7 - 15.5 g/dL   HCT 34.9 (L) 35.0 - 45.0 %   MCV 81.9 80.0 - 100.0 fL   MCH 24.6 (L) 27.0 - 33.0 pg   MCHC 30.1 (L) 32.0 - 36.0 g/dL   RDW 19.0 (H) 11.0 - 15.0 %   Platelets 227 140 - 400 K/uL   MPV 10.0 7.5 - 12.5 fL   Neutro Abs 4,380 1,500 - 7,800 cells/uL   Lymphs Abs 1,095 850 - 3,900 cells/uL   Monocytes Absolute 1,168 (H) 200 - 950 cells/uL   Eosinophils Absolute 584 (H) 15 - 500 cells/uL   Basophils Absolute 73 0 - 200 cells/uL   Neutrophils Relative % 60 %   Lymphocytes Relative 15 %   Monocytes Relative 16 %   Eosinophils Relative 8 %   Basophils Relative 1 %   Smear Review Criteria for review not met   Renal function panel  Result Value Ref Range   Sodium 139 135 - 146 mmol/L   Potassium 3.6 3.5 - 5.3 mmol/L   Chloride 91 (L) 98 - 110 mmol/L   CO2 37 (H) 20 - 31 mmol/L   Glucose, Bld 104 (H) 65 - 99 mg/dL   BUN 19 7 - 25 mg/dL   Creat 1.13 (H) 0.60 - 0.88 mg/dL   Albumin 3.5 (L) 3.6 - 5.1 g/dL   Calcium 9.6 8.6 - 10.4 mg/dL   Phosphorus 3.6 2.1 - 4.3 mg/dL      Assessment & Plan:   Problem List Items Addressed This Visit    Anemia - Primary    Hgb 13.3 (09/2016). s/p 1u pRBC last week with improved symptoms. Check CBC today to ensure stable readings. She has started oral iron therapy as well.       Relevant Orders   CBC with Differential/Platelet (Completed)   Aortic mural thrombus (HCC)   Relevant Medications   furosemide (LASIX) 40 MG tablet   metoprolol tartrate (LOPRESSOR) 25 MG tablet   digoxin (LANOXIN) 0.125 MG tablet   spironolactone (ALDACTONE) 25 MG tablet   pravastatin (PRAVACHOL) 40 MG tablet   Atrial fibrillation (HCC)    Chronic, only on aspirin currently after GI bleed and anemia. Check CBC today.        Relevant Medications   furosemide (LASIX) 40 MG tablet   metoprolol tartrate (LOPRESSOR) 25 MG tablet   digoxin (LANOXIN) 0.125 MG tablet   spironolactone (ALDACTONE) 25 MG tablet   pravastatin (PRAVACHOL) 40 MG tablet   Chronic anticoagulation    Only on aspirin - will need to discuss restarting eliquis at 1 mo f/u if  stable period, stable Hgb.       Chronic diastolic CHF (congestive heart failure) (HCC)    O2 stable on current supplemental 2L by [place]. Another 10 lb weight loss noted - concern for overdiuresis - check labs today and consider change in regimen.       Relevant Medications   furosemide (LASIX) 40 MG tablet   metoprolol tartrate (LOPRESSOR) 25 MG tablet   digoxin (LANOXIN) 0.125 MG tablet   spironolactone (ALDACTONE) 25 MG tablet   pravastatin (PRAVACHOL) 40 MG tablet   CKD (chronic kidney disease), stage III    Update labs.       Relevant Orders   CBC with Differential/Platelet (Completed)   Renal function panel (Completed)   RESOLVED: Decubitus ulcer of buttock, stage 2    Continues healing - with cleaning and desitin use after each bowel movement. Continues depends use.           Follow up plan: Return in about 4 weeks (around 01/22/2017) for follow up visit.  Ria Bush, MD

## 2016-12-26 NOTE — Assessment & Plan Note (Signed)
O2 stable on current supplemental 2L by [place]. Another 10 lb weight loss noted - concern for overdiuresis - check labs today and consider change in regimen.

## 2016-12-26 NOTE — Assessment & Plan Note (Signed)
Chronic, only on aspirin currently after GI bleed and anemia. Check CBC today.

## 2016-12-26 NOTE — Assessment & Plan Note (Addendum)
Hgb 13.3 (09/2016). s/p 1u pRBC last week with improved symptoms. Check CBC today to ensure stable readings. She has started oral iron therapy as well.

## 2016-12-26 NOTE — Assessment & Plan Note (Signed)
Update labs.  

## 2016-12-26 NOTE — Assessment & Plan Note (Signed)
Continues healing - with cleaning and desitin use after each bowel movement. Continues depends use.

## 2016-12-26 NOTE — Assessment & Plan Note (Signed)
Only on aspirin - will need to discuss restarting eliquis at 1 mo f/u if stable period, stable Hgb.

## 2016-12-27 ENCOUNTER — Telehealth: Payer: Self-pay | Admitting: Family Medicine

## 2016-12-27 ENCOUNTER — Other Ambulatory Visit: Payer: Self-pay | Admitting: Family Medicine

## 2016-12-27 MED ORDER — FUROSEMIDE 40 MG PO TABS: 40.0000 mg | ORAL_TABLET | Freq: Every day | ORAL | 3 refills | Status: DC

## 2016-12-27 MED ORDER — ASPIRIN EC 325 MG PO TBEC: 325.0000 mg | DELAYED_RELEASE_TABLET | Freq: Every day | ORAL | 0 refills | Status: AC

## 2016-12-27 NOTE — Telephone Encounter (Signed)
plz notify pt/daughter I touched base with neuro and GI - recommend increase dose of aspirin to 325mg  daily (instead of 81mg ). This is over the counter and will be new blood thinner dose for forseeable future.

## 2016-12-28 NOTE — Telephone Encounter (Signed)
Message left advising patient's daughter.  

## 2016-12-29 DIAGNOSIS — I5032 Chronic diastolic (congestive) heart failure: Secondary | ICD-10-CM | POA: Diagnosis not present

## 2016-12-29 DIAGNOSIS — R2689 Other abnormalities of gait and mobility: Secondary | ICD-10-CM | POA: Diagnosis not present

## 2016-12-29 DIAGNOSIS — N183 Chronic kidney disease, stage 3 (moderate): Secondary | ICD-10-CM | POA: Diagnosis not present

## 2016-12-29 DIAGNOSIS — M6281 Muscle weakness (generalized): Secondary | ICD-10-CM | POA: Diagnosis not present

## 2016-12-29 DIAGNOSIS — Z8673 Personal history of transient ischemic attack (TIA), and cerebral infarction without residual deficits: Secondary | ICD-10-CM | POA: Diagnosis not present

## 2016-12-29 DIAGNOSIS — I130 Hypertensive heart and chronic kidney disease with heart failure and stage 1 through stage 4 chronic kidney disease, or unspecified chronic kidney disease: Secondary | ICD-10-CM | POA: Diagnosis not present

## 2016-12-31 DIAGNOSIS — R2689 Other abnormalities of gait and mobility: Secondary | ICD-10-CM | POA: Diagnosis not present

## 2016-12-31 DIAGNOSIS — M6281 Muscle weakness (generalized): Secondary | ICD-10-CM | POA: Diagnosis not present

## 2016-12-31 DIAGNOSIS — I130 Hypertensive heart and chronic kidney disease with heart failure and stage 1 through stage 4 chronic kidney disease, or unspecified chronic kidney disease: Secondary | ICD-10-CM | POA: Diagnosis not present

## 2016-12-31 DIAGNOSIS — Z8673 Personal history of transient ischemic attack (TIA), and cerebral infarction without residual deficits: Secondary | ICD-10-CM | POA: Diagnosis not present

## 2016-12-31 DIAGNOSIS — I5032 Chronic diastolic (congestive) heart failure: Secondary | ICD-10-CM | POA: Diagnosis not present

## 2016-12-31 DIAGNOSIS — N183 Chronic kidney disease, stage 3 (moderate): Secondary | ICD-10-CM | POA: Diagnosis not present

## 2017-01-06 DIAGNOSIS — I130 Hypertensive heart and chronic kidney disease with heart failure and stage 1 through stage 4 chronic kidney disease, or unspecified chronic kidney disease: Secondary | ICD-10-CM | POA: Diagnosis not present

## 2017-01-06 DIAGNOSIS — N183 Chronic kidney disease, stage 3 (moderate): Secondary | ICD-10-CM | POA: Diagnosis not present

## 2017-01-06 DIAGNOSIS — M6281 Muscle weakness (generalized): Secondary | ICD-10-CM | POA: Diagnosis not present

## 2017-01-06 DIAGNOSIS — I5032 Chronic diastolic (congestive) heart failure: Secondary | ICD-10-CM | POA: Diagnosis not present

## 2017-01-06 DIAGNOSIS — R2689 Other abnormalities of gait and mobility: Secondary | ICD-10-CM | POA: Diagnosis not present

## 2017-01-06 DIAGNOSIS — Z8673 Personal history of transient ischemic attack (TIA), and cerebral infarction without residual deficits: Secondary | ICD-10-CM | POA: Diagnosis not present

## 2017-01-14 DIAGNOSIS — I5032 Chronic diastolic (congestive) heart failure: Secondary | ICD-10-CM | POA: Diagnosis not present

## 2017-01-14 DIAGNOSIS — Z8673 Personal history of transient ischemic attack (TIA), and cerebral infarction without residual deficits: Secondary | ICD-10-CM | POA: Diagnosis not present

## 2017-01-14 DIAGNOSIS — N183 Chronic kidney disease, stage 3 (moderate): Secondary | ICD-10-CM | POA: Diagnosis not present

## 2017-01-14 DIAGNOSIS — R2689 Other abnormalities of gait and mobility: Secondary | ICD-10-CM | POA: Diagnosis not present

## 2017-01-14 DIAGNOSIS — M6281 Muscle weakness (generalized): Secondary | ICD-10-CM | POA: Diagnosis not present

## 2017-01-14 DIAGNOSIS — I130 Hypertensive heart and chronic kidney disease with heart failure and stage 1 through stage 4 chronic kidney disease, or unspecified chronic kidney disease: Secondary | ICD-10-CM | POA: Diagnosis not present

## 2017-01-22 ENCOUNTER — Ambulatory Visit (INDEPENDENT_AMBULATORY_CARE_PROVIDER_SITE_OTHER): Payer: Medicare Other | Admitting: Family Medicine

## 2017-01-22 ENCOUNTER — Encounter: Payer: Self-pay | Admitting: Family Medicine

## 2017-01-22 VITALS — BP 126/68 | HR 76 | Temp 97.2°F | Ht 67.0 in | Wt 138.4 lb

## 2017-01-22 DIAGNOSIS — I482 Chronic atrial fibrillation, unspecified: Secondary | ICD-10-CM

## 2017-01-22 DIAGNOSIS — D638 Anemia in other chronic diseases classified elsewhere: Secondary | ICD-10-CM

## 2017-01-22 DIAGNOSIS — I7410 Embolism and thrombosis of unspecified parts of aorta: Secondary | ICD-10-CM | POA: Diagnosis not present

## 2017-01-22 DIAGNOSIS — I5032 Chronic diastolic (congestive) heart failure: Secondary | ICD-10-CM | POA: Diagnosis not present

## 2017-01-22 DIAGNOSIS — I10 Essential (primary) hypertension: Secondary | ICD-10-CM | POA: Diagnosis not present

## 2017-01-22 NOTE — Assessment & Plan Note (Signed)
Chronic, stable on current regimen - continue. 

## 2017-01-22 NOTE — Assessment & Plan Note (Signed)
Planned continue aspirin 325mg  daily.

## 2017-01-22 NOTE — Assessment & Plan Note (Signed)
Recent GI bleed - after discussion with neuro and GI, planned continue aspirin 325mg  daily.

## 2017-01-22 NOTE — Assessment & Plan Note (Addendum)
Seems euvolemic today, weight stable today. Continue current regimen. Continues supplemental 2L by [place].

## 2017-01-22 NOTE — Progress Notes (Signed)
BP 126/68 (BP Location: Left Arm, Cuff Size: Normal)   Pulse 76   Temp 97.2 F (36.2 C) (Oral)   Ht _0  (1.702 m)   Wt 138 lb 6.4 oz (62.8 kg)   SpO2 98%   BMI 21.68 kg/m      CC: 1 mo f/u visit Subjective:    Patient ID: Erica Bridges, female    DOB: 1927-04-24, 83 y.o.   MRN: [id number]  HPI: Erica Bridges is a 83 y.o. female presenting on 01/22/2017 for Follow-up (Anemia, CKD. Pt states she is feeling better.)   See prior note for details.  Overall feeling better.  After review with neuro and GI, rec only stay on aspirin 33m daily in place of eliquis despite known mural thrombus.   Denies significant dizziness or lightheadedness despite low blood pressure in office today. She has not been checking. Weights stable recently - 135 lbs at home.   HOH.  Some hemorrhoidal bleeding since colonoscopy. Requests proctomed refilled.   Relevant past medical, surgical, family and social history reviewed and updated as indicated. Interim medical history since our last visit reviewed. Allergies and medications reviewed and updated. Outpatient Medications Prior to Visit  Medication Sig Dispense Refill  . aspirin EC 325 MG tablet Take 1 tablet (325 mg total) by mouth daily. 30 tablet 0  . Calcium-Magnesium-Vitamin D (CALCIUM 1200+D3 PO) Take 1 tablet by mouth daily.    . cholecalciferol (VITAMIN D) 1000 UNITS tablet Take 1,000 Units by mouth daily.    . Coenzyme Q10 200 MG capsule Take 200 mg by mouth daily.    . digoxin (LANOXIN) 0.125 MG tablet Take 1 tablet (0.125 mg total) by mouth daily. 90 tablet 1  . ferrous sulfate 325 (65 FE) MG tablet Take 1 tablet (325 mg total) by mouth daily with breakfast.    . furosemide (LASIX) 40 MG tablet Take 1 tablet (40 mg total) by mouth daily. 90 tablet 3  . guaiFENesin (MUCINEX) 600 MG 12 hr tablet Take 1 tablet (600 mg total) by mouth 2 (two) times daily. 60 tablet 1  . magnesium oxide (MAG-OX) 400 MG tablet Take 400 mg by mouth daily.    .  metoprolol tartrate (LOPRESSOR) 25 MG tablet Take 0.5 tablets (12.5 mg total) by mouth 2 (two) times daily. 90 tablet 3  . potassium chloride (K-DUR,KLOR-CON) 10 MEQ tablet Take 1 tablet (10 mEq total) by mouth daily. 90 tablet 3  . pravastatin (PRAVACHOL) 40 MG tablet Take 1 tablet (40 mg total) by mouth daily. 90 tablet 3  . spironolactone (ALDACTONE) 25 MG tablet Take 0.5 tablets (12.5 mg total) by mouth daily. 45 tablet 3  . tiotropium (SPIRIVA) 18 MCG inhalation capsule Place 1 capsule (18 mcg total) into inhaler and inhale daily. 90 capsule 3   No facility-administered medications prior to visit.      Per HPI unless specifically indicated in ROS section below Review of Systems     Objective:    BP 126/68 (BP Location: Left Arm, Cuff Size: Normal)   Pulse 76   Temp 97.2 F (36.2 C) (Oral)   Ht _1  (1.702 m)   Wt 138 lb 6.4 oz (62.8 kg)   SpO2 98%   BMI 21.68 kg/m   Wt Readings from Last 3 Encounters:  01/22/17 138 lb 6.4 oz (62.8 kg)  12/25/16 138 lb (62.6 kg)  12/15/16 148 lb 12 oz (67.5 kg)    Physical Exam  Constitutional: She appears well-developed  and well-nourished. No distress.  HENT:  Mouth/Throat: Oropharynx is clear and moist. No oropharyngeal exudate.  HOH  Cardiovascular: Normal rate, regular rhythm and intact distal pulses.   Murmur (3/6 systolic) heard. Pulmonary/Chest: Effort normal and breath sounds normal. No respiratory distress. She has no wheezes. She has no rales.  Musculoskeletal: She exhibits edema (tight legs, trace pitting edema).  Skin: Skin is warm and dry. No rash noted.  Psychiatric: She has a normal mood and affect.  Nursing note and vitals reviewed.  Results for orders placed or performed in visit on 12/25/16  CBC with Differential/Platelet  Result Value Ref Range   WBC 7.3 3.8 - 10.8 K/uL   RBC 4.26 3.80 - 5.10 MIL/uL   Hemoglobin 10.5 (L) 11.7 - 15.5 g/dL   HCT 34.9 (L) 35.0 - 45.0 %   MCV 81.9 80.0 - 100.0 fL   MCH 24.6 (L)  27.0 - 33.0 pg   MCHC 30.1 (L) 32.0 - 36.0 g/dL   RDW 19.0 (H) 11.0 - 15.0 %   Platelets 227 140 - 400 K/uL   MPV 10.0 7.5 - 12.5 fL   Neutro Abs 4,380 1,500 - 7,800 cells/uL   Lymphs Abs 1,095 850 - 3,900 cells/uL   Monocytes Absolute 1,168 (H) 200 - 950 cells/uL   Eosinophils Absolute 584 (H) 15 - 500 cells/uL   Basophils Absolute 73 0 - 200 cells/uL   Neutrophils Relative % 60 %   Lymphocytes Relative 15 %   Monocytes Relative 16 %   Eosinophils Relative 8 %   Basophils Relative 1 %   Smear Review Criteria for review not met   Renal function panel  Result Value Ref Range   Sodium 139 135 - 146 mmol/L   Potassium 3.6 3.5 - 5.3 mmol/L   Chloride 91 (L) 98 - 110 mmol/L   CO2 37 (H) 20 - 31 mmol/L   Glucose, Bld 104 (H) 65 - 99 mg/dL   BUN 19 7 - 25 mg/dL   Creat 1.13 (H) 0.60 - 0.88 mg/dL   Albumin 3.5 (L) 3.6 - 5.1 g/dL   Calcium 9.6 8.6 - 10.4 mg/dL   Phosphorus 3.6 2.1 - 4.3 mg/dL      Assessment & Plan:   Problem List Items Addressed This Visit    Anemia    s/p 1u pRBC last month with improved symptoms. Stable period. Continues ferrous sulfate daily. Only on aspirin 394m daily.       Aortic mural thrombus (HCC)    Planned continue aspirin 3235mdaily.       Atrial fibrillation (HCC)    Recent GI bleed - after discussion with neuro and GI, planned continue aspirin 32520maily.       Chronic diastolic CHF (congestive heart failure) (HCC)    Seems euvolemic today, weight stable today. Continue current regimen. Continues supplemental 2L by [place].       Essential hypertension - Primary    Chronic, stable on current regimen - continue.           Follow up plan: Return in about 2 months (around 03/24/2017) for annual exam, prior fasting for blood work, medicare wellness visit.  JavRia BushD

## 2017-01-22 NOTE — Assessment & Plan Note (Signed)
s/p 1u pRBC last month with improved symptoms. Stable period. Continues ferrous sulfate daily. Only on aspirin 325mg  daily.

## 2017-01-22 NOTE — Patient Instructions (Signed)
You are doing well today. No changes to medicines. Return in 2 months for wellness visit with Katha Cabal and then follow up with me

## 2017-02-03 ENCOUNTER — Encounter: Payer: Self-pay | Admitting: Gynecology

## 2017-02-12 ENCOUNTER — Encounter: Payer: Self-pay | Admitting: Family Medicine

## 2017-02-12 ENCOUNTER — Ambulatory Visit (INDEPENDENT_AMBULATORY_CARE_PROVIDER_SITE_OTHER): Payer: Medicare Other | Admitting: Family Medicine

## 2017-02-12 VITALS — BP 120/70 | HR 61 | Temp 97.5°F | Resp 16 | Ht 67.0 in | Wt 147.2 lb

## 2017-02-12 DIAGNOSIS — I5033 Acute on chronic diastolic (congestive) heart failure: Secondary | ICD-10-CM | POA: Diagnosis not present

## 2017-02-12 DIAGNOSIS — N183 Chronic kidney disease, stage 3 unspecified: Secondary | ICD-10-CM

## 2017-02-12 DIAGNOSIS — I5032 Chronic diastolic (congestive) heart failure: Secondary | ICD-10-CM | POA: Diagnosis not present

## 2017-02-12 DIAGNOSIS — I7410 Embolism and thrombosis of unspecified parts of aorta: Secondary | ICD-10-CM

## 2017-02-12 DIAGNOSIS — H903 Sensorineural hearing loss, bilateral: Secondary | ICD-10-CM | POA: Diagnosis not present

## 2017-02-12 DIAGNOSIS — D638 Anemia in other chronic diseases classified elsewhere: Secondary | ICD-10-CM | POA: Diagnosis not present

## 2017-02-12 DIAGNOSIS — H3530 Unspecified macular degeneration: Secondary | ICD-10-CM | POA: Diagnosis not present

## 2017-02-12 DIAGNOSIS — H9190 Unspecified hearing loss, unspecified ear: Secondary | ICD-10-CM | POA: Insufficient documentation

## 2017-02-12 LAB — CBC WITH DIFFERENTIAL/PLATELET
Basophils Absolute: 61 cells/uL (ref 0–200)
Basophils Relative: 1 %
EOS PCT: 8 %
Eosinophils Absolute: 488 cells/uL (ref 15–500)
HCT: 38.4 % (ref 35.0–45.0)
Hemoglobin: 11.8 g/dL (ref 11.7–15.5)
LYMPHS PCT: 20 %
Lymphs Abs: 1220 cells/uL (ref 850–3900)
MCH: 26.5 pg — ABNORMAL LOW (ref 27.0–33.0)
MCHC: 30.7 g/dL — AB (ref 32.0–36.0)
MCV: 86.1 fL (ref 80.0–100.0)
MPV: 9.4 fL (ref 7.5–12.5)
Monocytes Absolute: 793 cells/uL (ref 200–950)
Monocytes Relative: 13 %
NEUTROS PCT: 58 %
Neutro Abs: 3538 cells/uL (ref 1500–7800)
Platelets: 188 10*3/uL (ref 140–400)
RBC: 4.46 MIL/uL (ref 3.80–5.10)
RDW: 20 % — AB (ref 11.0–15.0)
WBC: 6.1 10*3/uL (ref 3.8–10.8)

## 2017-02-12 MED ORDER — SPIRONOLACTONE 25 MG PO TABS: 25.0000 mg | ORAL_TABLET | Freq: Every day | ORAL | 3 refills | Status: AC

## 2017-02-12 NOTE — Patient Instructions (Addendum)
Labs today. Increase spironolactone to 25mg  daily, take with lasix 40mg  in the morning. Return in 1 month for follow up visit.  Good to see you today.  We will refer you to eye doctor.

## 2017-02-12 NOTE — Assessment & Plan Note (Addendum)
Weight gain, pedal edema, increased dyspnea /cough noted today. Anticipate CHF exacerbation - will increase spironolactone to 25mg  daily, continue lasix 40mg  daily as pt states she is voiding well.  Check renal panel, CBC, BNP today.

## 2017-02-12 NOTE — Assessment & Plan Note (Addendum)
Update CBC. Continues ferrous sulfate and ASA 325mg .

## 2017-02-12 NOTE — Assessment & Plan Note (Addendum)
Chronic hearing loss despite hearing aides, along with cerumen impaction today - will refer to ENT per pt and daughter request to Dr Cresenciano Lick.

## 2017-02-12 NOTE — Assessment & Plan Note (Addendum)
Patient states she was previously diagnosed with macular degeneration and is overdue for f/u with optho -requests referral to Dr Zigmund Daniel whom she has seen in the past.

## 2017-02-12 NOTE — Progress Notes (Signed)
BP 120/70 (BP Location: Right Arm, Cuff Size: Normal)   Pulse 61   Temp 97.5 F (36.4 C) (Oral)   Resp 16   Ht _0  (1.702 m)   Wt 147 lb 4 oz (66.8 kg)   SpO2 93%   BMI 23.06 kg/m    CC: ankle swelling, hearing loss Subjective:    Patient ID: Erica Bridges, female    DOB: 11-06-1926, 83 y.o.   MRN: [id number]  HPI: Erica Bridges is a 83 y.o. female presenting on 02/12/2017 for Joint Swelling   See prior note for details. Stays on aspirin 353m daily as only blood thinner.  2 wk h/o ankle swelling, increasing coughing and weight gain. More fatigued, more tired.   2 wk h/o hearing loss without ear pain. No fevers/chills. Requests ENT referral for audiology evaluation. Prior saw Dr KCresenciano Lick   [facility].   States she has diagnosis of macular degeneration. Would like to return to see Dr MZigmund Danielfor f/u visit who she prior saw, requests referral.   Relevant past medical, surgical, family and social history reviewed and updated as indicated. Interim medical history since our last visit reviewed. Allergies and medications reviewed and updated. Outpatient Medications Prior to Visit  Medication Sig Dispense Refill  . aspirin EC 325 MG tablet Take 1 tablet (325 mg total) by mouth daily. 30 tablet 0  . Calcium-Magnesium-Vitamin D (CALCIUM 1200+D3 PO) Take 1 tablet by mouth daily.    . cholecalciferol (VITAMIN D) 1000 UNITS tablet Take 1,000 Units by mouth daily.    . Coenzyme Q10 200 MG capsule Take 200 mg by mouth daily.    . digoxin (LANOXIN) 0.125 MG tablet Take 1 tablet (0.125 mg total) by mouth daily. 90 tablet 1  . ferrous sulfate 325 (65 FE) MG tablet Take 1 tablet (325 mg total) by mouth daily with breakfast.    . guaiFENesin (MUCINEX) 600 MG 12 hr tablet Take 1 tablet (600 mg total) by mouth 2 (two) times daily. 60 tablet 1  . hydrocortisone (PROCTO-MED HC) 2.5 % rectal cream Place 1 application rectally 2 (two) times daily. 30 g 0  . magnesium oxide (MAG-OX) 400 MG tablet Take 400  mg by mouth daily.    . potassium chloride (K-DUR,KLOR-CON) 10 MEQ tablet Take 1 tablet (10 mEq total) by mouth daily. 90 tablet 3  . pravastatin (PRAVACHOL) 40 MG tablet Take 1 tablet (40 mg total) by mouth daily. 90 tablet 3  . tiotropium (SPIRIVA) 18 MCG inhalation capsule Place 1 capsule (18 mcg total) into inhaler and inhale daily. 90 capsule 3  . spironolactone (ALDACTONE) 25 MG tablet Take 0.5 tablets (12.5 mg total) by mouth daily. 45 tablet 3  . furosemide (LASIX) 40 MG tablet Take 1 tablet (40 mg total) by mouth daily. 90 tablet 3  . metoprolol tartrate (LOPRESSOR) 25 MG tablet Take 0.5 tablets (12.5 mg total) by mouth 2 (two) times daily. 90 tablet 3   No facility-administered medications prior to visit.      Per HPI unless specifically indicated in ROS section below Review of Systems     Objective:    BP 120/70 (BP Location: Right Arm, Cuff Size: Normal)   Pulse 61   Temp 97.5 F (36.4 C) (Oral)   Resp 16   Ht _1  (1.702 m)   Wt 147 lb 4 oz (66.8 kg)   SpO2 93%   BMI 23.06 kg/m   Wt Readings from Last 3 Encounters:  02/12/17 147  lb 4 oz (66.8 kg)  01/22/17 138 lb 6.4 oz (62.8 kg)  12/25/16 138 lb (62.6 kg)    Physical Exam  Constitutional: She appears well-developed and well-nourished. No distress.  HENT:  Right Ear: Hearing, external ear and ear canal normal.  Left Ear: Hearing, external ear and ear canal normal.  Mouth/Throat: Oropharynx is clear and moist. No oropharyngeal exudate.  Cerumen bilateral canals  Eyes: Conjunctivae are normal. Pupils are equal, round, and reactive to light.  Neck: Normal range of motion. Neck supple.  Cardiovascular: Normal rate, regular rhythm and intact distal pulses.   Murmur (3/6 SEM USB) heard. Pulmonary/Chest: Effort normal. No respiratory distress. She has wheezes. She has no rales.  Crackles bibasilarly Exp wheezing upper lobes  Musculoskeletal: She exhibits edema (1+ pitting edema, tight BLE).  Skin: Skin is warm  and dry. No rash noted.  Psychiatric: She has a normal mood and affect.  Nursing note and vitals reviewed.  Results for orders placed or performed in visit on 12/25/16  CBC with Differential/Platelet  Result Value Ref Range   WBC 7.3 3.8 - 10.8 K/uL   RBC 4.26 3.80 - 5.10 MIL/uL   Hemoglobin 10.5 (L) 11.7 - 15.5 g/dL   HCT 34.9 (L) 35.0 - 45.0 %   MCV 81.9 80.0 - 100.0 fL   MCH 24.6 (L) 27.0 - 33.0 pg   MCHC 30.1 (L) 32.0 - 36.0 g/dL   RDW 19.0 (H) 11.0 - 15.0 %   Platelets 227 140 - 400 K/uL   MPV 10.0 7.5 - 12.5 fL   Neutro Abs 4,380 1,500 - 7,800 cells/uL   Lymphs Abs 1,095 850 - 3,900 cells/uL   Monocytes Absolute 1,168 (H) 200 - 950 cells/uL   Eosinophils Absolute 584 (H) 15 - 500 cells/uL   Basophils Absolute 73 0 - 200 cells/uL   Neutrophils Relative % 60 %   Lymphocytes Relative 15 %   Monocytes Relative 16 %   Eosinophils Relative 8 %   Basophils Relative 1 %   Smear Review Criteria for review not met   Renal function panel  Result Value Ref Range   Sodium 139 135 - 146 mmol/L   Potassium 3.6 3.5 - 5.3 mmol/L   Chloride 91 (L) 98 - 110 mmol/L   CO2 37 (H) 20 - 31 mmol/L   Glucose, Bld 104 (H) 65 - 99 mg/dL   BUN 19 7 - 25 mg/dL   Creat 1.13 (H) 0.60 - 0.88 mg/dL   Albumin 3.5 (L) 3.6 - 5.1 g/dL   Calcium 9.6 8.6 - 10.4 mg/dL   Phosphorus 3.6 2.1 - 4.3 mg/dL      Assessment & Plan:   Problem List Items Addressed This Visit    Anemia    Update CBC. Continues ferrous sulfate and ASA '325mg'$ .       Relevant Orders   CBC with Differential/Platelet   Chronic diastolic CHF (congestive heart failure) ([place]) - Primary    Weight gain, pedal edema, increased dyspnea /cough noted today. Anticipate CHF exacerbation - will increase spironolactone to '25mg'$  daily, continue lasix '40mg'$  daily as pt states she is voiding well.  Check renal panel, CBC, BNP today.       Relevant Medications   spironolactone (ALDACTONE) 25 MG tablet   Other Relevant Orders   Brain natriuretic  peptide   CKD (chronic kidney disease), stage III   Relevant Orders   Renal function panel   Hearing loss    Chronic hearing loss despite  hearing aides, along with cerumen impaction today - will refer to ENT per pt and daughter request to Dr Cresenciano Lick.       Relevant Orders   Ambulatory referral to ENT   Macular degeneration    Patient states she was previously diagnosed with macular degeneration and is overdue for f/u with optho -requests referral to Dr Zigmund Daniel whom she has seen in the past.       Relevant Orders   Ambulatory referral to Ophthalmology    Other Visit Diagnoses    Acute on chronic diastolic congestive heart failure (HCC)       Relevant Medications   spironolactone (ALDACTONE) 25 MG tablet   Other Relevant Orders   Brain natriuretic peptide       Follow up plan: Return in about 4 weeks (around 03/12/2017) for follow up visit.  Ria Bush, MD

## 2017-02-13 LAB — RENAL FUNCTION PANEL
ALBUMIN: 3.6 g/dL (ref 3.6–5.1)
BUN: 15 mg/dL (ref 7–25)
CALCIUM: 10.1 mg/dL (ref 8.6–10.4)
CO2: 33 mmol/L — ABNORMAL HIGH (ref 20–31)
Chloride: 95 mmol/L — ABNORMAL LOW (ref 98–110)
Creat: 0.89 mg/dL — ABNORMAL HIGH (ref 0.60–0.88)
GLUCOSE: 82 mg/dL (ref 65–99)
PHOSPHORUS: 3.8 mg/dL (ref 2.1–4.3)
Potassium: 4.7 mmol/L (ref 3.5–5.3)
SODIUM: 137 mmol/L (ref 135–146)

## 2017-02-13 LAB — BRAIN NATRIURETIC PEPTIDE: Brain Natriuretic Peptide: 351.3 pg/mL — ABNORMAL HIGH (ref <100)

## 2017-02-18 ENCOUNTER — Telehealth: Payer: Self-pay | Admitting: Family Medicine

## 2017-02-18 NOTE — Telephone Encounter (Signed)
Pt called to see what information you need. She did receive your msg and is requesting a cb. Her phone is working now.

## 2017-02-23 NOTE — Telephone Encounter (Signed)
[place] scheduled referrals

## 2017-03-03 ENCOUNTER — Ambulatory Visit (INDEPENDENT_AMBULATORY_CARE_PROVIDER_SITE_OTHER): Payer: Medicare Other | Admitting: Gynecology

## 2017-03-03 ENCOUNTER — Other Ambulatory Visit: Payer: Self-pay | Admitting: Gynecology

## 2017-03-03 ENCOUNTER — Encounter: Payer: Self-pay | Admitting: Gynecology

## 2017-03-03 VITALS — BP 100/61 | Ht 66.25 in | Wt 133.4 lb

## 2017-03-03 DIAGNOSIS — Z01419 Encounter for gynecological examination (general) (routine) without abnormal findings: Secondary | ICD-10-CM | POA: Diagnosis not present

## 2017-03-03 DIAGNOSIS — R300 Dysuria: Secondary | ICD-10-CM

## 2017-03-03 DIAGNOSIS — N3001 Acute cystitis with hematuria: Secondary | ICD-10-CM | POA: Diagnosis not present

## 2017-03-03 DIAGNOSIS — Z8542 Personal history of malignant neoplasm of other parts of uterus: Secondary | ICD-10-CM | POA: Diagnosis not present

## 2017-03-03 DIAGNOSIS — M8580 Other specified disorders of bone density and structure, unspecified site: Secondary | ICD-10-CM

## 2017-03-03 DIAGNOSIS — M8588 Other specified disorders of bone density and structure, other site: Secondary | ICD-10-CM

## 2017-03-03 DIAGNOSIS — I7410 Embolism and thrombosis of unspecified parts of aorta: Secondary | ICD-10-CM

## 2017-03-03 LAB — URINALYSIS W MICROSCOPIC + REFLEX CULTURE
Bilirubin Urine: NEGATIVE
CASTS: NONE SEEN [LPF]
CRYSTALS: NONE SEEN [HPF]
Glucose, UA: NEGATIVE
Ketones, ur: NEGATIVE
NITRITE: NEGATIVE
PROTEIN: NEGATIVE
SPECIFIC GRAVITY, URINE: 1.015 (ref 1.001–1.035)
Yeast: NONE SEEN [HPF]
pH: 7.5 (ref 5.0–8.0)

## 2017-03-03 MED ORDER — CIPROFLOXACIN HCL 250 MG PO TABS: 250.0000 mg | ORAL_TABLET | Freq: Two times a day (BID) | ORAL | 0 refills | Status: DC

## 2017-03-03 NOTE — Progress Notes (Signed)
Erica Bridges 05/12/1927 [id number]   History:    83 y.o.  for annual gyn exam with complaint of dysuria and frequency. She denies any fever, chills, nausea, vomiting or any back pain.Patient has a past history of a radical hysterectomy and radiation therapy for uterine cancer (stage?) She was being followed by medical oncology group at [facility] and was scheduled to followup in August of this year but her medical oncologist past moved. She had a normal Pap smear in June of this year and was referred to our GYN oncologist at [facility] Dr. Charlaine Dalton who saw her in 2014 and her Pap smear was normal in 2006 before her radical hysterectomy.Dr. Dyke Maes Pearson's assessment was:"Remote history of endometrial cancer status post surgery and radiation therapy. Patient's clinically free of disease"  Patient one time had been on Coumadin for atrial fibrillation.Patient had a a colonoscopy in 2014 were by benign polyps were removed. Dr. Ria Bush is her primary physician who is been monitoring and treating her for hyperlipidemia and hypertension.  Last bone density study was in 2017 the lowest T score was at the left femoral neck with a value of -1.6 and normal Frax analysis. She had a normal calcium vitamin D level within the past 12 months.  Past medical history,surgical history, family history and social history were all reviewed and documented in the EPIC chart.  Gynecologic History No LMP recorded. Patient has had a hysterectomy. Contraception: status post hysterectomy Last Pap: 2017. Results were: normal Last mammogram: 2015. Results were: normal  Obstetric History OB History  Gravida Para Term Preterm AB Living  4 3     1 3   SAB TAB Ectopic Multiple Live Births  1            # Outcome Date GA Lbr Len/2nd Weight Sex Delivery Anes PTL Lv  4 SAB           3 Para           2 Para           1 Para                ROS: A ROS was performed and pertinent positives  and negatives are included in the history.  GENERAL: No fevers or chills. HEENT: No change in vision, no earache, sore throat or sinus congestion. NECK: No pain or stiffness. CARDIOVASCULAR: No chest pain or pressure. No palpitations. PULMONARY: No shortness of breath, cough or wheeze. GASTROINTESTINAL: No abdominal pain, nausea, vomiting or diarrhea, melena or bright red blood per rectum. GENITOURINARY: No urinary frequency, urgency, hesitancy or dysuria. MUSCULOSKELETAL: No joint or muscle pain, no back pain, no recent trauma. DERMATOLOGIC: No rash, no itching, no lesions. ENDOCRINE: No polyuria, polydipsia, no heat or cold intolerance. No recent change in weight. HEMATOLOGICAL: No anemia or easy bruising or bleeding. NEUROLOGIC: No headache, seizures, numbness, tingling or weakness. PSYCHIATRIC: No depression, no loss of interest in normal activity or change in sleep pattern.     Exam: chaperone present  BP 100/61   Ht 5' 6.25" (1.683 m)   Wt 133 lb 6.4 oz (60.5 kg)   BMI 21.37 kg/m   Body mass index is 21.37 kg/m.  General appearance : Well developed well nourished female. No acute distress HEENT: Eyes: no retinal hemorrhage or exudates,  Neck supple, trachea midline, no carotid bruits, no thyroidmegaly Lungs: Clear to auscultation, no rhonchi or wheezes, or rib retractions  Heart: Regular rate  and rhythm, no murmurs or gallops Breast:Examined in sitting and supine position were symmetrical in appearance, no palpable masses or tenderness,  no skin retraction, no nipple inversion, no nipple discharge, no skin discoloration, no axillary or supraclavicular lymphadenopathy Abdomen: no palpable masses or tenderness, no rebound or guarding Extremities: no edema or skin discoloration or tenderness  Pelvic:  Bartholin, Urethra, Skene Glands: Within normal limits             Vagina: No gross lesions or discharge, short atrophic  Cervix: Absent  Uterus  absent  Adnexa  Without masses or  tenderness  Anus and perineum  normal   Rectovaginal  normal sphincter tone without palpated masses or tenderness             Hemoccult PCP provides   Urinalysis: 3+ leukocyte 1+ blood microscopic tumors to count white blood cells 3-10 RBC and moderate bacteria  Assessment/Plan:  83 y.o. female for annual exam with a remote history of endometrial cancer post surgery and radiation with no evidence of recurrent disease doing well. Pap smear was done today. PCP is been doing her blood work. Patient with clinical evidence of urinary tract infection will be treated with Cipro 250 mg one by mouth twice a day for 3 days   Terrance Mass MD, 11:15 AM 03/03/2017

## 2017-03-03 NOTE — Patient Instructions (Signed)
Ciprofloxacin tablets What is this medicine? CIPROFLOXACIN (sip roe FLOX a sin) is a quinolone antibiotic. It is used to treat certain kinds of bacterial infections. It will not work for colds, flu, or other viral infections. This medicine may be used for other purposes; ask your health care provider or pharmacist if you have questions. COMMON BRAND NAME(S): Cipro What should I tell my health care provider before I take this medicine? They need to know if you have any of these conditions: -bone problems -history of low levels of potassium in the blood -joint problems -irregular heartbeat -kidney disease -myasthenia gravis -seizures -tendon problems -tingling of the fingers or toes, or other nerve disorder -an unusual or allergic reaction to ciprofloxacin, other antibiotics or medicines, foods, dyes, or preservatives -pregnant or trying to get pregnant -breast-feeding How should I use this medicine? Take this medicine by mouth with a glass of water. Follow the directions on the prescription label. Take your medicine at regular intervals. Do not take your medicine more often than directed. Take all of your medicine as directed even if you think your are better. Do not skip doses or stop your medicine early. You can take this medicine with food or on an empty stomach. It can be taken with a meal that contains dairy or calcium, but do not take it alone with a dairy product, like milk or yogurt or calcium-fortified juice. A special MedGuide will be given to you by the pharmacist with each prescription and refill. Be sure to read this information carefully each time. Talk to your pediatrician regarding the use of this medicine in children. Special care may be needed. Overdosage: If you think you have taken too much of this medicine contact a poison control center or emergency room at once. NOTE: This medicine is only for you. Do not share this medicine with others. What if I miss a dose? If you  miss a dose, take it as soon as you can. If it is almost time for your next dose, take only that dose. Do not take double or extra doses. What may interact with this medicine? Do not take this medicine with any of the following medications: -cisapride -dofetilide -dronedarone -flibanserin -lomitapide -pimozide -thioridazine -tizanidine -ziprasidone This medicine may also interact with the following medications: -antacids -birth control pills -caffeine -certain medicines for diabetes, like glipizide or glyburide -certain medicines that treat or prevent blood clots like warfarin -clozapine -cyclosporine -didanosine (ddI) buffered tablets or powder -duloxetine -lanthanum carbonate -lidocaine -methotrexate -multivitamins -NSAIDS, medicines for pain and inflammation, like ibuprofen or naproxen -olanzapine -omeprazole -other medicines that prolong the QT interval (cause an abnormal heart rhythm) -phenytoin -probenecid -ropinirole -sevelamer -sildenafil -sucralfate -theophylline -zolpidem This list may not describe all possible interactions. Give your health care provider a list of all the medicines, herbs, non-prescription drugs, or dietary supplements you use. Also tell them if you smoke, drink alcohol, or use illegal drugs. Some items may interact with your medicine. What should I watch for while using this medicine? Tell your doctor or health care professional if your symptoms do not improve. Do not treat diarrhea with over the counter products. Contact your doctor if you have diarrhea that lasts more than 2 days or if it is severe and watery. You may get drowsy or dizzy. Do not drive, use machinery, or do anything that needs mental alertness until you know how this medicine affects you. Do not stand or sit up quickly, especially if you are an older patient. This reduces  the risk of dizzy or fainting spells. This medicine can make you more sensitive to the sun. Keep out of the  sun. If you cannot avoid being in the sun, wear protective clothing and use sunscreen. Do not use sun lamps or tanning beds/booths. Avoid antacids, aluminum, calcium, iron, magnesium, and zinc products for 6 hours before and 2 hours after taking a dose of this medicine. What side effects may I notice from receiving this medicine? Side effects that you should report to your doctor or health care professional as soon as possible: -allergic reactions like skin rash or hives, swelling of the face, lips, or tongue -anxious -confusion -depressed mood -diarrhea -fast, irregular heartbeat -hallucination, loss of contact with reality -joint, muscle, or tendon pain or swelling -pain, tingling, numbness in the hands or feet -suicidal thoughts or other mood changes -sunburn -unusually weak or tired Side effects that usually do not require medical attention (report to your doctor or health care professional if they continue or are bothersome): -dry mouth -headache -nausea -trouble sleeping This list may not describe all possible side effects. Call your doctor for medical advice about side effects. You may report side effects to FDA at 1-800-FDA-1088. Where should I keep my medicine? Keep out of the reach of children. Store at room temperature below 30 degrees C (86 degrees F). Keep container tightly closed. Throw away any unused medicine after the expiration date. NOTE: This sheet is a summary. It may not cover all possible information. If you have questions about this medicine, talk to your doctor, pharmacist, or health care provider.  2018 Elsevier/Gold Standard (2016-04-17 14:42:02) Urinary Tract Infection, Adult A urinary tract infection (UTI) is an infection of any part of the urinary tract, which includes the kidneys, ureters, bladder, and urethra. These organs make, store, and get rid of urine in the body. UTI can be a bladder infection (cystitis) or kidney infection (pyelonephritis). What  are the causes? This infection may be caused by fungi, viruses, or bacteria. Bacteria are the most common cause of UTIs. This condition can also be caused by repeated incomplete emptying of the bladder during urination. What increases the risk? This condition is more likely to develop if:  You ignore your need to urinate or hold urine for long periods of time.  You do not empty your bladder completely during urination.  You wipe back to front after urinating or having a bowel movement, if you are female.  You are uncircumcised, if you are female.  You are constipated.  You have a urinary catheter that stays in place (indwelling).  You have a weak defense (immune) system.  You have a medical condition that affects your bowels, kidneys, or bladder.  You have diabetes.  You take antibiotic medicines frequently or for long periods of time, and the antibiotics no longer work well against certain types of infections (antibiotic resistance).  You take medicines that irritate your urinary tract.  You are exposed to chemicals that irritate your urinary tract.  You are female.  What are the signs or symptoms? Symptoms of this condition include:  Fever.  Frequent urination or passing small amounts of urine frequently.  Needing to urinate urgently.  Pain or burning with urination.  Urine that smells bad or unusual.  Cloudy urine.  Pain in the lower abdomen or back.  Trouble urinating.  Blood in the urine.  Vomiting or being less hungry than normal.  Diarrhea or abdominal pain.  Vaginal discharge, if you are female.  How  is this diagnosed? This condition is diagnosed with a medical history and physical exam. You will also need to provide a urine sample to test your urine. Other tests may be done, including:  Blood tests.  Sexually transmitted disease (STD) testing.  If you have had more than one UTI, a cystoscopy or imaging studies may be done to determine the cause  of the infections. How is this treated? Treatment for this condition often includes a combination of two or more of the following:  Antibiotic medicine.  Other medicines to treat less common causes of UTI.  Over-the-counter medicines to treat pain.  Drinking enough water to stay hydrated.  Follow these instructions at home:  Take over-the-counter and prescription medicines only as told by your health care provider.  If you were prescribed an antibiotic, take it as told by your health care provider. Do not stop taking the antibiotic even if you start to feel better.  Avoid alcohol, caffeine, tea, and carbonated beverages. They can irritate your bladder.  Drink enough fluid to keep your urine clear or pale yellow.  Keep all follow-up visits as told by your health care provider. This is important.  Make sure to: ? Empty your bladder often and completely. Do not hold urine for long periods of time. ? Empty your bladder before and after sex. ? Wipe from front to back after a bowel movement if you are female. Use each tissue one time when you wipe. Contact a health care provider if:  You have back pain.  You have a fever.  You feel nauseous or vomit.  Your symptoms do not get better after 3 days.  Your symptoms go away and then return. Get help right away if:  You have severe back pain or lower abdominal pain.  You are vomiting and cannot keep down any medicines or water. This information is not intended to replace advice given to you by your health care provider. Make sure you discuss any questions you have with your health care provider. Document Released: 06/17/2005 Document Revised: 02/19/2016 Document Reviewed: 07/29/2015 Elsevier Interactive Patient Education  2017 [employer].

## 2017-03-04 ENCOUNTER — Encounter (INDEPENDENT_AMBULATORY_CARE_PROVIDER_SITE_OTHER): Payer: Medicare Other | Admitting: Ophthalmology

## 2017-03-04 DIAGNOSIS — H35033 Hypertensive retinopathy, bilateral: Secondary | ICD-10-CM

## 2017-03-04 DIAGNOSIS — H43813 Vitreous degeneration, bilateral: Secondary | ICD-10-CM

## 2017-03-04 DIAGNOSIS — I10 Essential (primary) hypertension: Secondary | ICD-10-CM

## 2017-03-05 LAB — URINE CULTURE

## 2017-03-08 ENCOUNTER — Telehealth: Payer: Self-pay

## 2017-03-08 LAB — PAP IG W/ RFLX HPV ASCU

## 2017-03-08 NOTE — Telephone Encounter (Signed)
Pt left v/m; pt last seen DR G on 02/12/17 and wt was 147; pt seen at GYN for UTI on 03/03/17 and wt was 133. Today at home pt wt is 125. Pt said was to cb if started losing wt. Pt has been eating and drinking.  Pt request cb. [place].

## 2017-03-10 ENCOUNTER — Ambulatory Visit: Payer: Medicare Other | Admitting: Diagnostic Neuroimaging

## 2017-03-10 NOTE — Telephone Encounter (Signed)
Patient having problems with her phone asked for me to call & leave a voice message (Done)

## 2017-03-10 NOTE — Telephone Encounter (Signed)
Thank you for the message. plz keep daily log of weights over the next week, and schedule f/u appt later this week or early next week to review. May need diuretics changed. How is she feeling otherwise?

## 2017-03-15 ENCOUNTER — Ambulatory Visit (INDEPENDENT_AMBULATORY_CARE_PROVIDER_SITE_OTHER): Payer: Medicare Other | Admitting: Diagnostic Neuroimaging

## 2017-03-15 ENCOUNTER — Encounter: Payer: Self-pay | Admitting: Diagnostic Neuroimaging

## 2017-03-15 ENCOUNTER — Encounter (INDEPENDENT_AMBULATORY_CARE_PROVIDER_SITE_OTHER): Payer: Self-pay

## 2017-03-15 VITALS — BP 119/58 | HR 69 | Wt 132.6 lb

## 2017-03-15 DIAGNOSIS — I749 Embolism and thrombosis of unspecified artery: Secondary | ICD-10-CM

## 2017-03-15 DIAGNOSIS — G459 Transient cerebral ischemic attack, unspecified: Secondary | ICD-10-CM | POA: Diagnosis not present

## 2017-03-15 DIAGNOSIS — I771 Stricture of artery: Secondary | ICD-10-CM

## 2017-03-15 DIAGNOSIS — I482 Chronic atrial fibrillation, unspecified: Secondary | ICD-10-CM

## 2017-03-15 DIAGNOSIS — I7410 Embolism and thrombosis of unspecified parts of aorta: Secondary | ICD-10-CM | POA: Diagnosis not present

## 2017-03-15 NOTE — Progress Notes (Addendum)
[facility]  PATIENT: Erica Bridges DOB: [birth year]  REFERRING CLINICIAN: Erlinda Hong / stroke team HISTORY FROM: patient and caregiver Lyndee Leo) REASON FOR VISIT: follow up    HISTORICAL  CHIEF COMPLAINT:  Chief Complaint  Patient presents with  . TIA due to embolism    rm 6, sitter/caregiver- Lyndee Leo, " doing well"  . Follow-up    3 month    HISTORY OF PRESENT ILLNESS:   UPDATE 03/15/17: Since last visit, no recurrent TIA/stroke/neuro issues. Patient on O2 via [place]. Continues on aspirin. Having some more intermittent issues with hearing loss.   NEW HPI (VRP, 12/07/16): 83 year old female with hospital admission from 10/21/2016 until 10/29/2016 for transient left hand incoordination and numbness, slurred speech, diagnosed as TIA related to aortic thrombus, here for stroke/TIA follow-up. After hospital discharge patient lives at skilled nursing facility for rehabilitation. Now patient living back at home. She lives alone and is cared for by her daughter and an aide who visits her 3 times per week. Patient no longer drives. She continues to have balance and gait difficulty, insomnia, memory loss, confusion and dizziness. She needs help with many of her activities of daily living. There is some stress between patient and daughter and other family members related to discussions about the best method for her long-term care. In addition when patient was at [facility] facility, she was having some bright red blood per rectum and therefore eliquis was was discontinued and she was maintained on aspirin only. However this was not apparently discussed with her PCP or GI doctor.  DISCHARGE SUMMARY (Dr. Allyson Sabal, 10/29/16): "TIA/Aorticthrombus: Noted on CTA neck 7 x 7 x 20 mm mural thrombuswith potential for embolism, severe stenosis left subclavian and left vertebral artery origins. Seen by CVTS and deemed not a surgical candiate. -Patient had presented with left hand numbness and  slurred speech with rapid improvement with symptoms and a such TPA was not initiated.  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  -Carotid Doppler showed bilateral ICA 40-59% stenosis. 2 DL coordinate had a EF of 55-60% with no source of emboli. Hemoglobin A1c was 5.7. LDL of 59.  -Neurology recommended patient be changed from Coumadin to eliquis for secondary stroke prevention.  Chronic atrial fibrillation CHA2DS2VASC score >3 She has been switched to Eliquis from coumadin given the finding of her thrombus. Switched from Coumadin to Eliquis 5mg  BID. Continued on ASA -Home atenolol held by Cardiology secondary to soft BP Dig was reduced to 0.125mg  and low dose metoprolol 12.5mg  BID added back. Blood pressure has tolerated, HR now in the 70s-80s  Ascending Aortic thrombus -Noted on angiogram of the neck.  -Patient has been seen in consultation by cardiothoracic surgery who feel that patient is not a candidate for open surgical repair. - Monitor for now"   REVIEW OF SYSTEMS: Full 14 system review of systems performed and negative with exception of: only as per HPI.    ALLERGIES: Allergies  Allergen Reactions  . Amoxicillin     REACTION: rashj  . Penicillins     Has patient had a PCN reaction causing immediate rash, facial/tongue/throat swelling, SOB or lightheadedness with hypotension: no Has patient had a PCN reaction causing severe rash involving mucus membranes or skin necrosis: no Has patient had a PCN reaction that required hospitalization no Has patient had a PCN reaction occurring within the last 10 years: no If all of the above answers are "NO", then may proceed  with Cephalosporin use.     HOME MEDICATIONS: Outpatient Medications Prior to Visit  Medication Sig Dispense Refill  . aspirin EC 325 MG tablet Take 1 tablet (325 mg total) by mouth daily. 30 tablet 0  . Calcium-Magnesium-Vitamin D (CALCIUM 1200+D3 PO) Take 1  tablet by mouth daily.    . cholecalciferol (VITAMIN D) 1000 UNITS tablet Take 1,000 Units by mouth daily.    . Coenzyme Q10 200 MG capsule Take 200 mg by mouth daily.    . digoxin (LANOXIN) 0.125 MG tablet Take 1 tablet (0.125 mg total) by mouth daily. 90 tablet 1  . ferrous sulfate 325 (65 FE) MG tablet Take 1 tablet (325 mg total) by mouth daily with breakfast.    . guaiFENesin (MUCINEX) 600 MG 12 hr tablet Take 1 tablet (600 mg total) by mouth 2 (two) times daily. 60 tablet 1  . magnesium oxide (MAG-OX) 400 MG tablet Take 400 mg by mouth daily.    . potassium chloride (K-DUR,KLOR-CON) 10 MEQ tablet Take 1 tablet (10 mEq total) by mouth daily. 90 tablet 3  . pravastatin (PRAVACHOL) 40 MG tablet Take 1 tablet (40 mg total) by mouth daily. 90 tablet 3  . spironolactone (ALDACTONE) 25 MG tablet Take 1 tablet (25 mg total) by mouth daily. 90 tablet 3  . tiotropium (SPIRIVA) 18 MCG inhalation capsule Place 1 capsule (18 mcg total) into inhaler and inhale daily. 90 capsule 3  . hydrocortisone (PROCTO-MED HC) 2.5 % rectal cream Place 1 application rectally 2 (two) times daily. 30 g 0  . metoprolol tartrate (LOPRESSOR) 25 MG tablet Take 0.5 tablets (12.5 mg total) by mouth 2 (two) times daily. 90 tablet 3  . ciprofloxacin (CIPRO) 250 MG tablet Take 1 tablet (250 mg total) by mouth 2 (two) times daily. 6 tablet 0  . furosemide (LASIX) 40 MG tablet Take 1 tablet (40 mg total) by mouth daily. 90 tablet 3   No facility-administered medications prior to visit.     PAST MEDICAL HISTORY: Past Medical History:  Diagnosis Date  . Asthma   . Atrial fibrillation ([place])   . Benign neoplasm of colon 10/22/2005   Hyperplastic  & Adenomatous polyps   . Cardiomegaly 2013   by xray  . Diverticulosis of colon (without mention of hemorrhage)   . External hemorrhoids without mention of complication   . HLD (hyperlipidemia)   . HTN (hypertension)   . Malignant neoplasm of corpus uteri, except isthmus ([place])     followed by [name] with yearly visits  . Onychia and paronychia of toe   . Osteopenia 2017   by xray, but DEXA WNL 2014  . Pneumonia   . Polycythemia 11/2011   normal EPO and periph smear  . Stroke [facility])     PAST SURGICAL HISTORY: Past Surgical History:  Procedure Laterality Date  . CATARACT EXTRACTION Bilateral   . COLONOSCOPY  10/22/05   polyps-bx negative; divertics, ext hemmorhoids  . COLONOSCOPY  03/2013   tubular adenomas, small angiodysplastic lesion at cecum, mod diverticulosis [facility]le[facility] Purl)  . DEXA  2014   WNL  . GYNECOLOGIC CRYOSURGERY    . LUMBAR DISC SURGERY  1970s  . REFRACTIVE SURGERY Bilateral   . TONSILLECTOMY    . TOTAL ABDOMINAL HYSTERECTOMY W/ BILATERAL SALPINGOOPHORECTOMY  2005   uterine adenocarcinoma    FAMILY HISTORY: Family History  Problem Relation Age of Onset  . Hypertension Father   . Stroke Father   . Diabetes Mother   . Coronary artery disease  Mother   . Hypertension Mother   . Colon cancer Neg Hx     SOCIAL HISTORY:  Social History   Social History  . Marital status: Widowed    Spouse name: N/A  . Number of children: 3  . Years of education: N/A   Occupational History  . Retired Radiation protection practitioner    Social History Main Topics  . Smoking status: Former Smoker    Types: Cigarettes    Quit date: 09/21/1982  . Smokeless tobacco: Never Used  . Alcohol use No  . Drug use: No  . Sexual activity: No   Other Topics Concern  . Not on file   Social History Narrative   Retired, widow, lives alone, caretakers in during day, family checks on her at night, stays with her at night.   One son Janeece Riggers - part time post office), one daughter (Quinn pastor), other daughter Janeece Riggers)   Daily caffeine 1+   03/15/17  Lyndee Leo is caregiver 3 days a week     PHYSICAL EXAM  GENERAL EXAM/CONSTITUTIONAL: Vitals:  Vitals:   03/15/17 1457  BP: (!) 119/58  Pulse: 69  Weight: 132 lb 9.6 oz (60.1 kg)   Body mass index is 21.24  kg/m. No exam data present  Patient is in no distress; well developed, nourished and groomed; neck is supple  FRAIL APPEARING  ON OXYGEN  CARDIOVASCULAR:  Examination of carotid arteries is normal; no carotid bruits  Regular rate and rhythm, no murmurs  Examination of peripheral vascular system by observation and palpation is normal  EYES:  Ophthalmoscopic exam of optic discs and posterior segments is normal; no papilledema or hemorrhages  MUSCULOSKELETAL:  Gait, strength, tone, movements noted in Neurologic exam below  NEUROLOGIC: MENTAL STATUS:  No flowsheet data found.  awake, alert, oriented to person, place and time  [facility] memory   DECR attention and concentration  language fluent, comprehension intact, naming intact,   fund of knowledge appropriate  CRANIAL NERVE:   2nd - no papilledema on fundoscopic exam  2nd, 3rd, 4th, 6th - pupils equal and reactive to light, visual fields full to confrontation, extraocular muscles intact, no nystagmus  5th - facial sensation symmetric  7th - facial strength symmetric  8th - hearing --> DECREASED  9th - palate elevates symmetrically, uvula midline  11th - shoulder shrug symmetric  12th - tongue protrusion midline  MILD DYSARTHRIA  MOTOR:   normal bulk and tone, full strength in the BUE, BLE  SENSORY:   normal and symmetric to light touch, temperature, vibration  COORDINATION:   finger-nose-finger, fine finger movements normal  REFLEXES:   deep tendon reflexes TRACE and symmetric  GAIT/STATION:   CAUTIOUS, SLOW GAIT; USES WALKER    DIAGNOSTIC DATA (LABS, IMAGING, TESTING) - I reviewed patient records, labs, notes, testing and imaging myself where available.  Lab Results  Component Value Date   WBC 6.1 02/12/2017   HGB 11.8 02/12/2017   HCT 38.4 02/12/2017   MCV 86.1 02/12/2017   PLT 188 02/12/2017      Component Value Date/Time   NA 137 02/12/2017 1732   K 4.7 02/12/2017 1732    CL 95 (L) 02/12/2017 1732   CO2 33 (H) 02/12/2017 1732   GLUCOSE 82 02/12/2017 1732   BUN 15 02/12/2017 1732   CREATININE 0.89 (H) 02/12/2017 1732   CALCIUM 10.1 02/12/2017 1732   PROT 6.5 12/02/2016 1128   ALBUMIN 3.6 02/12/2017 1732   AST 22 12/02/2016 1128   ALT 10  12/02/2016 1128   ALKPHOS 82 12/02/2016 1128   BILITOT 0.6 12/02/2016 1128   GFRNONAA 55 (L) 10/28/2016 0520   GFRAA >60 10/28/2016 0520   Lab Results  Component Value Date   CHOL 149 10/21/2016   HDL 78 10/21/2016   LDLCALC 59 10/21/2016   TRIG 62 10/21/2016   CHOLHDL 1.9 10/21/2016   Lab Results  Component Value Date   HGBA1C 5.7 (H) 10/21/2016   No results found for: VITAMINB12 Lab Results  Component Value Date   TSH 3.41 11/29/2012    10/23/16 CTA neck [I reviewed images myself and agree with interpretation. -VRP]  1. Ascending aortic 7 x 7 x 20 mm mural thrombus with potential for embolism. 2. Severe stenosis LEFT subclavian and LEFT vertebral artery origins. Mild stenosis RIGHT vertebral artery origin. 3. Severe atherosclerosis without hemodynamically significant stenosis of the internal carotid artery's. 4. Moderate canal stenosis C6-7. Severe C3-4 thru C6-7 neural foraminal narrowing.  10/21/16 MRI brain / MRA head [I reviewed images myself and agree with interpretation. -VRP]  1. No acute intracranial abnormality. 2. Mild chronic small vessel ischemic disease. 3. No major intracranial vessel occlusion or significant stenosis. 4. 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  10/22/16 TTE  - Normal LV systolic function; elevated LV filling pressure; D   shaped septum; biatrial enlargement; moderate RVE with moderately   reduced RV function; mild MR; severe TR with moderate to severe   elevation in pulmonary pressure.     ASSESSMENT AND PLAN  83 y.o. year old female here with suspected right brain TIA due to a ascending aortic mural thrombus, chronic atrial fibrillation, previously on coumadin (INR  2.15). Patient was discharged with eliquis anticoagulation to her skilled nursing facility, however this was discontinued due to bright red blood per rectum. Now patient maintained on aspirin 81 mg daily.   Also with severe left subclavian stenosis.  Also with chronic congestive heart failure.  Also with oxygen dependence by nasal cannula.  Also with decreased mobility and high fall risk.   Dx:  1. TIA due to embolism ([place])   2. Chronic atrial fibrillation (HCC)   3. Thrombus of aorta (HCC)   4. Stenosis of left subclavian artery (HCC)      PLAN:  TRANSIENT ISCHEMIC ATTACK (established problem, stable) - safety and supervision issues reviewed: no driving and patient should not live alone - continue aspirin 81mg  daily; we did consider restarting eliquis if cleared by GI (apparently eliquis was stopped due to GI bleeding at [facility]); however, this may not be safe to restart due to her her balance and cognitive issues, co-morbid medical issues  Return if symptoms worsen or fail to improve, for return to PCP.    Penni Bombard, MD 7/37/1062, 6:94 PM Certified in Neurology, Neurophysiology and Neuroimaging  [facility] [address] [place], [place] [address] [phone]

## 2017-03-17 DIAGNOSIS — H6062 Unspecified chronic otitis externa, left ear: Secondary | ICD-10-CM | POA: Diagnosis not present

## 2017-03-17 DIAGNOSIS — H90A31 Mixed conductive and sensorineural hearing loss, unilateral, right ear with restricted hearing on the contralateral side: Secondary | ICD-10-CM | POA: Diagnosis not present

## 2017-03-17 DIAGNOSIS — H6123 Impacted cerumen, bilateral: Secondary | ICD-10-CM | POA: Diagnosis not present

## 2017-03-25 DIAGNOSIS — H90A31 Mixed conductive and sensorineural hearing loss, unilateral, right ear with restricted hearing on the contralateral side: Secondary | ICD-10-CM | POA: Diagnosis not present

## 2017-05-01 ENCOUNTER — Other Ambulatory Visit: Payer: Self-pay | Admitting: Family Medicine

## 2017-05-01 DIAGNOSIS — N183 Chronic kidney disease, stage 3 unspecified: Secondary | ICD-10-CM

## 2017-05-01 DIAGNOSIS — D638 Anemia in other chronic diseases classified elsewhere: Secondary | ICD-10-CM

## 2017-05-01 DIAGNOSIS — I482 Chronic atrial fibrillation, unspecified: Secondary | ICD-10-CM

## 2017-05-01 DIAGNOSIS — E7800 Pure hypercholesterolemia, unspecified: Secondary | ICD-10-CM

## 2017-05-03 ENCOUNTER — Other Ambulatory Visit (INDEPENDENT_AMBULATORY_CARE_PROVIDER_SITE_OTHER): Payer: Medicare Other

## 2017-05-03 DIAGNOSIS — I482 Chronic atrial fibrillation, unspecified: Secondary | ICD-10-CM

## 2017-05-03 DIAGNOSIS — E7800 Pure hypercholesterolemia, unspecified: Secondary | ICD-10-CM | POA: Diagnosis not present

## 2017-05-03 DIAGNOSIS — N183 Chronic kidney disease, stage 3 unspecified: Secondary | ICD-10-CM

## 2017-05-03 DIAGNOSIS — D638 Anemia in other chronic diseases classified elsewhere: Secondary | ICD-10-CM

## 2017-05-03 LAB — CBC WITH DIFFERENTIAL/PLATELET
BASOS ABS: 0.1 10*3/uL (ref 0.0–0.1)
Basophils Relative: 1 % (ref 0.0–3.0)
EOS PCT: 8.1 % — AB (ref 0.0–5.0)
Eosinophils Absolute: 0.5 10*3/uL (ref 0.0–0.7)
HEMATOCRIT: 44.9 % (ref 36.0–46.0)
Hemoglobin: 14.5 g/dL (ref 12.0–15.0)
LYMPHS PCT: 20.6 % (ref 12.0–46.0)
Lymphs Abs: 1.3 10*3/uL (ref 0.7–4.0)
MCHC: 32.3 g/dL (ref 30.0–36.0)
MCV: 89.4 fl (ref 78.0–100.0)
MONOS PCT: 14.3 % — AB (ref 3.0–12.0)
Monocytes Absolute: 0.9 10*3/uL (ref 0.1–1.0)
Neutro Abs: 3.5 10*3/uL (ref 1.4–7.7)
Neutrophils Relative %: 56 % (ref 43.0–77.0)
Platelets: 162 10*3/uL (ref 150.0–400.0)
RBC: 5.02 Mil/uL (ref 3.87–5.11)
RDW: 14.8 % (ref 11.5–15.5)
WBC: 6.3 10*3/uL (ref 4.0–10.5)

## 2017-05-03 LAB — RENAL FUNCTION PANEL
Albumin: 4.2 g/dL (ref 3.5–5.2)
BUN: 27 mg/dL — ABNORMAL HIGH (ref 6–23)
CALCIUM: 10.5 mg/dL (ref 8.4–10.5)
CO2: 35 meq/L — AB (ref 19–32)
CREATININE: 1.02 mg/dL (ref 0.40–1.20)
Chloride: 96 mEq/L (ref 96–112)
GFR: 54.35 mL/min — AB (ref >60.00)
GLUCOSE: 86 mg/dL (ref 70–99)
PHOSPHORUS: 3.5 mg/dL (ref 2.3–4.6)
POTASSIUM: 3.7 meq/L (ref 3.5–5.1)
Sodium: 139 mEq/L (ref 135–145)

## 2017-05-03 LAB — LIPID PANEL
Cholesterol: 141 mg/dL (ref 0–200)
HDL: 64.7 mg/dL (ref >39.00)
LDL CALC: 60 mg/dL (ref 0–99)
NONHDL: 76.54
Total CHOL/HDL Ratio: 2
Triglycerides: 81 mg/dL (ref 0.0–149.0)
VLDL: 16.2 mg/dL (ref 0.0–40.0)

## 2017-05-03 LAB — VITAMIN D 25 HYDROXY (VIT D DEFICIENCY, FRACTURES): VITD: 64.53 ng/mL (ref 30.00–100.00)

## 2017-05-03 LAB — IBC PANEL
Iron: 51 ug/dL (ref 42–145)
SATURATION RATIOS: 11.2 % — AB (ref 20.0–50.0)
Transferrin: 324 mg/dL (ref 212.0–360.0)

## 2017-05-03 LAB — TSH: TSH: 3.15 u[IU]/mL (ref 0.35–4.50)

## 2017-05-03 LAB — FERRITIN: FERRITIN: 51.5 ng/mL (ref 10.0–291.0)

## 2017-05-04 LAB — DIGOXIN LEVEL: Digoxin Level: 0.7 ug/L — ABNORMAL LOW (ref 0.8–2.0)

## 2017-05-05 ENCOUNTER — Encounter: Payer: Medicare Other | Admitting: Family Medicine

## 2017-05-26 ENCOUNTER — Encounter: Payer: Medicare Other | Admitting: Family Medicine

## 2017-05-28 ENCOUNTER — Encounter: Payer: Self-pay | Admitting: Family Medicine

## 2017-05-28 ENCOUNTER — Ambulatory Visit (INDEPENDENT_AMBULATORY_CARE_PROVIDER_SITE_OTHER): Payer: Medicare Other | Admitting: Family Medicine

## 2017-05-28 VITALS — BP 132/62 | HR 78 | Temp 97.8°F | Ht 67.75 in | Wt 124.2 lb

## 2017-05-28 DIAGNOSIS — Z23 Encounter for immunization: Secondary | ICD-10-CM | POA: Diagnosis not present

## 2017-05-28 DIAGNOSIS — I482 Chronic atrial fibrillation, unspecified: Secondary | ICD-10-CM

## 2017-05-28 DIAGNOSIS — N183 Chronic kidney disease, stage 3 unspecified: Secondary | ICD-10-CM

## 2017-05-28 DIAGNOSIS — K5900 Constipation, unspecified: Secondary | ICD-10-CM | POA: Insufficient documentation

## 2017-05-28 DIAGNOSIS — G459 Transient cerebral ischemic attack, unspecified: Secondary | ICD-10-CM

## 2017-05-28 DIAGNOSIS — I5032 Chronic diastolic (congestive) heart failure: Secondary | ICD-10-CM

## 2017-05-28 DIAGNOSIS — D638 Anemia in other chronic diseases classified elsewhere: Secondary | ICD-10-CM

## 2017-05-28 DIAGNOSIS — E7800 Pure hypercholesterolemia, unspecified: Secondary | ICD-10-CM

## 2017-05-28 DIAGNOSIS — I749 Embolism and thrombosis of unspecified artery: Secondary | ICD-10-CM | POA: Diagnosis not present

## 2017-05-28 DIAGNOSIS — Z7189 Other specified counseling: Secondary | ICD-10-CM

## 2017-05-28 DIAGNOSIS — I7410 Embolism and thrombosis of unspecified parts of aorta: Secondary | ICD-10-CM | POA: Diagnosis not present

## 2017-05-28 NOTE — Assessment & Plan Note (Signed)
Seems euvolemic on lasix 40mg  daily and spironlactone 25mg  daily with Kdur 4mEq daily - continue this.

## 2017-05-28 NOTE — Assessment & Plan Note (Signed)
Stable period - continue pravastatin.

## 2017-05-28 NOTE — Assessment & Plan Note (Signed)
Intermittent diarrhea/constipation - possible related to iron intake but she has been taking both an anti-diarrheal as well as a laxative/stool softener, both OTC. I suggested she stop both and then reassess need for bowel regimen. She agrees.

## 2017-05-28 NOTE — Assessment & Plan Note (Signed)
Advanced directive planning: form scanned into chart (01/2015) - no prolonged life support if deemed terminal/incurable or persistent vegetative state. HCPOA - would be son Erica Bridges. Has updated this. Will bring [place] copy.

## 2017-05-28 NOTE — Patient Instructions (Addendum)
Flu shot today Let's hold the anti-diarrheal and the vegetables laxative and we will see how stools do. Update me with effect.  Continue current medicines. Labs were looking ok today. Return in 3 months for follow up visit.

## 2017-05-28 NOTE — Assessment & Plan Note (Signed)
Reviewed latest neurology note. Appreciate their care. She has been released.  She is only on aspirin 325mg  daily as blood thinner. Difficult situation - at risk for recurrent stroke (last stroke while on coumadin) but when on eliquis she had GI bleed (thought hemorrhoidal, but known colonic angiodysplasia). Reviewed with patient - she decides to stay on aspirin 325mg  at this time, open to discussion of replacement with eliquis at future visit.

## 2017-05-28 NOTE — Assessment & Plan Note (Signed)
See above. GI bleed earlier in 2018. Only on aspirin 325mg  daily.

## 2017-05-28 NOTE — Progress Notes (Signed)
BP 132/62   Pulse 78   Temp 97.8 F (36.6 C) (Oral)   Ht 5' 7.75" (1.721 m)   Wt 124 lb 4 oz (56.4 kg)   SpO2 93%   BMI 19.03 kg/m    CC: medicare wellness - pt declines and would like CPE Subjective:    Patient ID: Erica Bridges, female    DOB: 1926/09/30, 83 y.o.   MRN: [id number]  HPI: Erica Bridges is a 83 y.o. female presenting on 05/28/2017 for Annual Exam   Here with caregiver "sitter".    R TIA due to ascending aortic mural thrombus, chronic afib, previously on coumadin then eliquis now on only aspirin (eliquis stopped after BRBPR during SNF stay). She also has known severe L subclavian stenosis, chronic CHF, and O2 dependence.   Saw neurology 02/2017 - rec no driving and no living alone. Children stay with her at night. She is not driving. She continues taking iron tablet daily (may have been taking 2 iron tablets daily). She endorses intermittent diarrhea and constipation.   Denies pedal edema. Denies significant dyspnea. Denies chest or abdominal pain. Intermittent diarrhea and constipation.  She is using continuous pulse ox at home at 2L [place].   Preventative: COLONOSCOPY Date: 03/2013 tubular adenomas, small angiodysplastic lesion at cecum, mod diverticulosis Carlean Purl). H/o uterine cancer s/p hysterectomy 2005. Sees Dr Fernandez/forsythe yearly DEXA - 2014 WNL.  Mammogram 05/2014. Pt sets this up herself.  Flu shot - yearly  Td 2010  Pneumovax 2006. Prevnar 01/2014 zostavax - declines. Too expensive  Advanced directive planning: form scanned into chart (01/2015) - no prolonged life support if deemed terminal/incurable or persistent vegetative state. HCPOA - would be son Beverely Low. Has updated this. Will bring [place] copy. Seat belt use discussed Sunscreen use discussed. No changing moles on skin.  Husband lived at nursing home then deceased. Retired, widow  One son Sports administrator - part time post office), one daughter ([place] Engineer, mining), other daughter  Janeece Riggers) Activity: no regular exercise Diet: good water, fruits/vegetables daily  Relevant past medical, surgical, family and social history reviewed and updated as indicated. Interim medical history since our last visit reviewed. Allergies and medications reviewed and updated. Outpatient Medications Prior to Visit  Medication Sig Dispense Refill  . aspirin EC 325 MG tablet Take 1 tablet (325 mg total) by mouth daily. 30 tablet 0  . Calcium-Magnesium-Vitamin D (CALCIUM 1200+D3 PO) Take 1 tablet by mouth daily.    . cholecalciferol (VITAMIN D) 1000 UNITS tablet Take 1,000 Units by mouth daily.    . Coenzyme Q10 200 MG capsule Take 200 mg by mouth daily.    . digoxin (LANOXIN) 0.125 MG tablet Take 1 tablet (0.125 mg total) by mouth daily. 90 tablet 1  . ferrous sulfate 325 (65 FE) MG tablet Take 1 tablet (325 mg total) by mouth daily with breakfast.    . guaiFENesin (MUCINEX) 600 MG 12 hr tablet Take 1 tablet (600 mg total) by mouth 2 (two) times daily. 60 tablet 1  . hydrocortisone (PROCTO-MED HC) 2.5 % rectal cream Place 1 application rectally 2 (two) times daily. 30 g 0  . magnesium oxide (MAG-OX) 400 MG tablet Take 400 mg by mouth daily.    . potassium chloride (K-DUR,KLOR-CON) 10 MEQ tablet Take 1 tablet (10 mEq total) by mouth daily. 90 tablet 3  . pravastatin (PRAVACHOL) 40 MG tablet Take 1 tablet (40 mg total) by mouth daily. 90 tablet 3  . spironolactone (ALDACTONE) 25 MG tablet Take  1 tablet (25 mg total) by mouth daily. 90 tablet 3  . tiotropium (SPIRIVA) 18 MCG inhalation capsule Place 1 capsule (18 mcg total) into inhaler and inhale daily. 90 capsule 3  . metoprolol tartrate (LOPRESSOR) 25 MG tablet Take 0.5 tablets (12.5 mg total) by mouth 2 (two) times daily. 90 tablet 3   No facility-administered medications prior to visit.      Per HPI unless specifically indicated in ROS section below Review of Systems     Objective:    BP 132/62   Pulse 78   Temp 97.8 F (36.6 C)  (Oral)   Ht 5' 7.75" (1.721 m)   Wt 124 lb 4 oz (56.4 kg)   SpO2 93%   BMI 19.03 kg/m   Wt Readings from Last 3 Encounters:  05/28/17 124 lb 4 oz (56.4 kg)  03/15/17 132 lb 9.6 oz (60.1 kg)  03/03/17 133 lb 6.4 oz (60.5 kg)    Physical Exam  Constitutional: She appears well-developed and well-nourished. No distress.  2L continuous O2 by [place]  HENT:  Mouth/Throat: Oropharynx is clear and moist. No oropharyngeal exudate.  Cardiovascular: Normal rate and intact distal pulses.  An irregular rhythm present.  Murmur (3/6 SEM) heard. Pulmonary/Chest: Effort normal and breath sounds normal. No respiratory distress. She has no wheezes. She has no rales.  Abdominal: Soft. There is no tenderness.  Musculoskeletal: She exhibits no edema.  Skin: Skin is warm and dry. No rash noted. No erythema.  Psychiatric: She has a normal mood and affect.  Nursing note and vitals reviewed.  Results for orders placed or performed in visit on 05/03/17  Lipid panel  Result Value Ref Range   Cholesterol 141 0 - 200 mg/dL   Triglycerides 81.0 0.0 - 149.0 mg/dL   HDL 64.70 >39.00 mg/dL   VLDL 16.2 0.0 - 40.0 mg/dL   LDL Cholesterol 60 0 - 99 mg/dL   Total CHOL/HDL Ratio 2    NonHDL 76.54   TSH  Result Value Ref Range   TSH 3.15 0.35 - 4.50 uIU/mL  VITAMIN D 25 Hydroxy (Vit-D Deficiency, Fractures)  Result Value Ref Range   VITD 64.53 30.00 - 100.00 ng/mL  IBC panel  Result Value Ref Range   Iron 51 42 - 145 ug/dL   Transferrin 324.0 212.0 - 360.0 mg/dL   Saturation Ratios 11.2 (L) 20.0 - 50.0 %  Ferritin  Result Value Ref Range   Ferritin 51.5 10.0 - 291.0 ng/mL  Digoxin level  Result Value Ref Range   Digoxin Level 0.7 (L) 0.8 - 2.0 ug/L  Renal function panel  Result Value Ref Range   Sodium 139 135 - 145 mEq/L   Potassium 3.7 3.5 - 5.1 mEq/L   Chloride 96 96 - 112 mEq/L   CO2 35 (H) 19 - 32 mEq/L   Calcium 10.5 8.4 - 10.5 mg/dL   Albumin 4.2 3.5 - 5.2 g/dL   BUN 27 (H) 6 - 23 mg/dL    Creatinine, Ser 1.02 0.40 - 1.20 mg/dL   Glucose, Bld 86 70 - 99 mg/dL   Phosphorus 3.5 2.3 - 4.6 mg/dL   GFR 54.35 (L) >60.00 mL/min  CBC with Differential/Platelet  Result Value Ref Range   WBC 6.3 4.0 - 10.5 K/uL   RBC 5.02 3.87 - 5.11 Mil/uL   Hemoglobin 14.5 12.0 - 15.0 g/dL   HCT 44.9 36.0 - 46.0 %   MCV 89.4 78.0 - 100.0 fl   MCHC 32.3 30.0 - 36.0 g/dL  RDW 14.8 11.5 - 15.5 %   Platelets 162.0 150.0 - 400.0 K/uL   Neutrophils Relative % 56.0 43.0 - 77.0 %   Lymphocytes Relative 20.6 12.0 - 46.0 %   Monocytes Relative 14.3 (H) 3.0 - 12.0 %   Eosinophils Relative 8.1 (H) 0.0 - 5.0 %   Basophils Relative 1.0 0.0 - 3.0 %   Neutro Abs 3.5 1.4 - 7.7 K/uL   Lymphs Abs 1.3 0.7 - 4.0 K/uL   Monocytes Absolute 0.9 0.1 - 1.0 K/uL   Eosinophils Absolute 0.5 0.0 - 0.7 K/uL   Basophils Absolute 0.1 0.0 - 0.1 K/uL      Assessment & Plan:   Problem List Items Addressed This Visit    Advanced care planning/counseling discussion    Advanced directive planning: form scanned into chart (01/2015) - no prolonged life support if deemed terminal/incurable or persistent vegetative state. HCPOA - would be son Beverely Low. Has updated this. Will bring [place] copy.      Anemia    Anemia has normalized with daily iron - will continue iron for now.       Aortic mural thrombus (HCC)   Relevant Medications   furosemide (LASIX) 40 MG tablet   Atrial fibrillation (HCC)    See above. GI bleed earlier in 2018. Only on aspirin 325mg  daily.       Relevant Medications   furosemide (LASIX) 40 MG tablet   Chronic diastolic CHF (congestive heart failure) (HCC)    Seems euvolemic on lasix 40mg  daily and spironlactone 25mg  daily with Kdur 81mEq daily - continue this.       Relevant Medications   furosemide (LASIX) 40 MG tablet   CKD (chronic kidney disease), stage III    Stable period.       Constipation    Intermittent diarrhea/constipation - possible related to iron intake but she has been taking both  an anti-diarrheal as well as a laxative/stool softener, both OTC. I suggested she stop both and then reassess need for bowel regimen. She agrees.       HYPERCHOLESTEROLEMIA    Stable period - continue pravastatin.       Relevant Medications   furosemide (LASIX) 40 MG tablet   TIA due to embolism ([facility]) - Primary    Reviewed latest neurology note. Appreciate their care. She has been released.  She is only on aspirin 325mg  daily as blood thinner. Difficult situation - at risk for recurrent stroke (last stroke while on coumadin) but when on eliquis she had GI bleed (thought hemorrhoidal, but known colonic angiodysplasia). Reviewed with patient - she decides to stay on aspirin 325mg  at this time, open to discussion of replacement with eliquis at future visit.       Relevant Medications   furosemide (LASIX) 40 MG tablet    Other Visit Diagnoses    Need for influenza vaccination       Relevant Orders   Flu Vaccine QUAD 6+ mos PF IM (Fluarix Quad PF) (Completed)       Follow up plan: Return in about 3 months (around 08/27/2017), or if symptoms worsen or fail to improve, for follow up visit.  Ria Bush, MD

## 2017-05-28 NOTE — Assessment & Plan Note (Addendum)
Anemia has normalized with daily iron - will continue iron for now.

## 2017-05-28 NOTE — Assessment & Plan Note (Signed)
Stable period.  

## 2017-05-31 ENCOUNTER — Encounter: Payer: Medicare Other | Admitting: Family Medicine

## 2017-06-01 ENCOUNTER — Ambulatory Visit: Payer: Medicare Other | Admitting: Family Medicine

## 2017-06-01 ENCOUNTER — Ambulatory Visit (INDEPENDENT_AMBULATORY_CARE_PROVIDER_SITE_OTHER): Payer: Medicare Other | Admitting: Family Medicine

## 2017-06-01 ENCOUNTER — Encounter: Payer: Self-pay | Admitting: Family Medicine

## 2017-06-01 VITALS — BP 100/62 | HR 66 | Temp 97.5°F | Ht 66.25 in | Wt 127.5 lb

## 2017-06-01 DIAGNOSIS — I7410 Embolism and thrombosis of unspecified parts of aorta: Secondary | ICD-10-CM | POA: Diagnosis not present

## 2017-06-01 DIAGNOSIS — R350 Frequency of micturition: Secondary | ICD-10-CM

## 2017-06-01 DIAGNOSIS — N3000 Acute cystitis without hematuria: Secondary | ICD-10-CM | POA: Insufficient documentation

## 2017-06-01 LAB — POC URINALSYSI DIPSTICK (AUTOMATED)
Bilirubin, UA: NEGATIVE
Glucose, UA: NEGATIVE
KETONES UA: NEGATIVE
Nitrite, UA: NEGATIVE
PH UA: 6 (ref 5.0–8.0)
PROTEIN UA: NEGATIVE
SPEC GRAV UA: 1.025 (ref 1.010–1.025)
Urobilinogen, UA: 0.2 E.U./dL

## 2017-06-01 MED ORDER — SULFAMETHOXAZOLE-TRIMETHOPRIM 800-160 MG PO TABS: 1.0000 | ORAL_TABLET | Freq: Two times a day (BID) | ORAL | 0 refills | Status: DC

## 2017-06-01 NOTE — Progress Notes (Signed)
   Subjective:    Patient ID: Erica Bridges, female    DOB: 05-07-1927, 83 y.o.   MRN: [id number]  Urinary Frequency   This is a new problem. The current episode started in the past 7 days. The quality of the pain is described as burning. The pain is at a severity of 1/10. There has been no fever. She is not sexually active. There is no history of pyelonephritis. Associated symptoms include frequency and urgency. Pertinent negatives include no chills, discharge, flank pain, hematuria, hesitancy, nausea or vomiting. She has tried increased fluids for the symptoms. The treatment provided no relief. Her past medical history is significant for recurrent UTIs. There is no history of catheterization, kidney stones, a single kidney, urinary stasis or a urological procedure.    Treated for UTI, citrobacter  (res to amox) about 3 months ago by GYN with cipro per pt.  Saw PCP for   Annual exam on 05/28/17  hx CKD Hx pf TIA with embolism, hospitalized ion 09/2016  chronic afib, ascending aortic thrombus Review of Systems  Constitutional: Negative for chills.  Gastrointestinal: Negative for nausea and vomiting.  Genitourinary: Positive for frequency and urgency. Negative for flank pain, hematuria and hesitancy.       Objective:   Physical Exam  Constitutional: Vital signs are normal. She appears well-developed and well-nourished. She is cooperative.  Non-toxic appearance. She does not appear ill. No distress.  Elderly female in NAD.  HENT:  Head: Normocephalic.  Right Ear: Hearing, tympanic membrane, external ear and ear canal normal. Tympanic membrane is not erythematous, not retracted and not bulging.  Left Ear: Hearing, tympanic membrane, external ear and ear canal normal. Tympanic membrane is not erythematous, not retracted and not bulging.  Nose: No mucosal edema or rhinorrhea. Right sinus exhibits no maxillary sinus tenderness and no frontal sinus tenderness. Left sinus exhibits no maxillary sinus  tenderness and no frontal sinus tenderness.  Mouth/Throat: Uvula is midline, oropharynx is clear and moist and mucous membranes are normal.  Eyes: Pupils are equal, round, and reactive to light. Conjunctivae, EOM and lids are normal. Lids are everted and swept, no foreign bodies found.  Neck: Trachea normal and normal range of motion. Neck supple. Carotid bruit is not present. No thyroid mass and no thyromegaly present.  Cardiovascular: Normal rate, regular rhythm, S1 normal, S2 normal, normal heart sounds, intact distal pulses and normal pulses.  Exam reveals no gallop and no friction rub.   No murmur heard. Pulmonary/Chest: Effort normal and breath sounds normal. No tachypnea. No respiratory distress. She has no decreased breath sounds. She has no wheezes. She has no rhonchi. She has no rales.  Abdominal: Soft. Normal appearance and bowel sounds are normal. There is no tenderness.  Neurological: She is alert.  Skin: Skin is warm, dry and intact. No rash noted.  Psychiatric: Her speech is normal and behavior is normal. Judgment and thought content normal. Her mood appears not anxious. Cognition and memory are normal. She does not exhibit a depressed mood.          Assessment & Plan:

## 2017-06-01 NOTE — Assessment & Plan Note (Signed)
Push fluids, rest, treat with antibiotics. Will send culture.

## 2017-06-01 NOTE — Patient Instructions (Signed)
Complete antibiotics x 3 days. Push fluids, rest. We will call with culture results.  Urinary Tract Infection, Adult A urinary tract infection (UTI) is an infection of any part of the urinary tract, which includes the kidneys, ureters, bladder, and urethra. These organs make, store, and get rid of urine in the body. UTI can be a bladder infection (cystitis) or kidney infection (pyelonephritis). What are the causes? This infection may be caused by fungi, viruses, or bacteria. Bacteria are the most common cause of UTIs. This condition can also be caused by repeated incomplete emptying of the bladder during urination. What increases the risk? This condition is more likely to develop if:  You ignore your need to urinate or hold urine for long periods of time.  You do not empty your bladder completely during urination.  You wipe back to front after urinating or having a bowel movement, if you are female.  You are uncircumcised, if you are female.  You are constipated.  You have a urinary catheter that stays in place (indwelling).  You have a weak defense (immune) system.  You have a medical condition that affects your bowels, kidneys, or bladder.  You have diabetes.  You take antibiotic medicines frequently or for long periods of time, and the antibiotics no longer work well against certain types of infections (antibiotic resistance).  You take medicines that irritate your urinary tract.  You are exposed to chemicals that irritate your urinary tract.  You are female.  What are the signs or symptoms? Symptoms of this condition include:  Fever.  Frequent urination or passing small amounts of urine frequently.  Needing to urinate urgently.  Pain or burning with urination.  Urine that smells bad or unusual.  Cloudy urine.  Pain in the lower abdomen or back.  Trouble urinating.  Blood in the urine.  Vomiting or being less hungry than normal.  Diarrhea or abdominal  pain.  Vaginal discharge, if you are female.  How is this diagnosed? This condition is diagnosed with a medical history and physical exam. You will also need to provide a urine sample to test your urine. Other tests may be done, including:  Blood tests.  Sexually transmitted disease (STD) testing.  If you have had more than one UTI, a cystoscopy or imaging studies may be done to determine the cause of the infections. How is this treated? Treatment for this condition often includes a combination of two or more of the following:  Antibiotic medicine.  Other medicines to treat less common causes of UTI.  Over-the-counter medicines to treat pain.  Drinking enough water to stay hydrated.  Follow these instructions at home:  Take over-the-counter and prescription medicines only as told by your health care provider.  If you were prescribed an antibiotic, take it as told by your health care provider. Do not stop taking the antibiotic even if you start to feel better.  Avoid alcohol, caffeine, tea, and carbonated beverages. They can irritate your bladder.  Drink enough fluid to keep your urine clear or pale yellow.  Keep all follow-up visits as told by your health care provider. This is important.  Make sure to: ? Empty your bladder often and completely. Do not hold urine for long periods of time. ? Empty your bladder before and after sex. ? Wipe from front to back after a bowel movement if you are female. Use each tissue one time when you wipe. Contact a health care provider if:  You have back pain.  You have a fever.  You feel nauseous or vomit.  Your symptoms do not get better after 3 days.  Your symptoms go away and then return. Get help right away if:  You have severe back pain or lower abdominal pain.  You are vomiting and cannot keep down any medicines or water. This information is not intended to replace advice given to you by your health care provider. Make sure  you discuss any questions you have with your health care provider. Document Released: 06/17/2005 Document Revised: 02/19/2016 Document Reviewed: 07/29/2015 Elsevier Interactive Patient Education  2017 [employer].

## 2017-06-01 NOTE — Addendum Note (Signed)
Addended by: Carter Kitten on: 06/01/2017 01:16 PM   Modules accepted: Orders

## 2017-06-03 LAB — URINE CULTURE
MICRO NUMBER: 81000027
SPECIMEN QUALITY: ADEQUATE

## 2017-06-04 ENCOUNTER — Telehealth: Payer: Self-pay

## 2017-06-04 MED ORDER — FUROSEMIDE 40 MG PO TABS: 40.0000 mg | ORAL_TABLET | Freq: Every day | ORAL | 6 refills | Status: AC

## 2017-06-04 NOTE — Telephone Encounter (Signed)
Should be taking lasix 40mg  once daily. Sent this dose to [facility].  Thank you.

## 2017-06-04 NOTE — Telephone Encounter (Signed)
Patient calls with concerns over how she is supposed to take her furosemide.  Thinks it was left off of her medication list when she was last seen and she is confused.  She has always taken 40mg  1 pill daily but when picked up bottle this week it was sent in for 40mg  1 po bid.    According to 05/28/17 and 06/01/17 OV notes, there were no changes made to patient's furosemide and she was to continue once daily furosemide 40mg .  I informed her to keep taking 1 daily and I will inform MD.  Erica Bridges states that R/X refills continue to be sent in from our office for Furosemide 40mg  1 po bid #60 for patient for last several months. [facility] says patient has been getting this for a while and is not new. The r/x filled this week for patient was dated April 6th, 2018. So, it was an older r/x, obviously if she is only taking once daily, she isn't needing it as much.    Just wanted to clarify that patient is to take 1 daily and will update orders with pharmacy as needed.    Thanks.

## 2017-08-14 IMAGING — DX DG CHEST 2V
2 series · 2 of 2 positions shown · non-contrast
Comparison: 04/18/2011 and earlier.

CLINICAL DATA: 88-year-old female with polycythemia. Initial
encounter.

EXAM:
CHEST  2 VIEW

[chest pa]
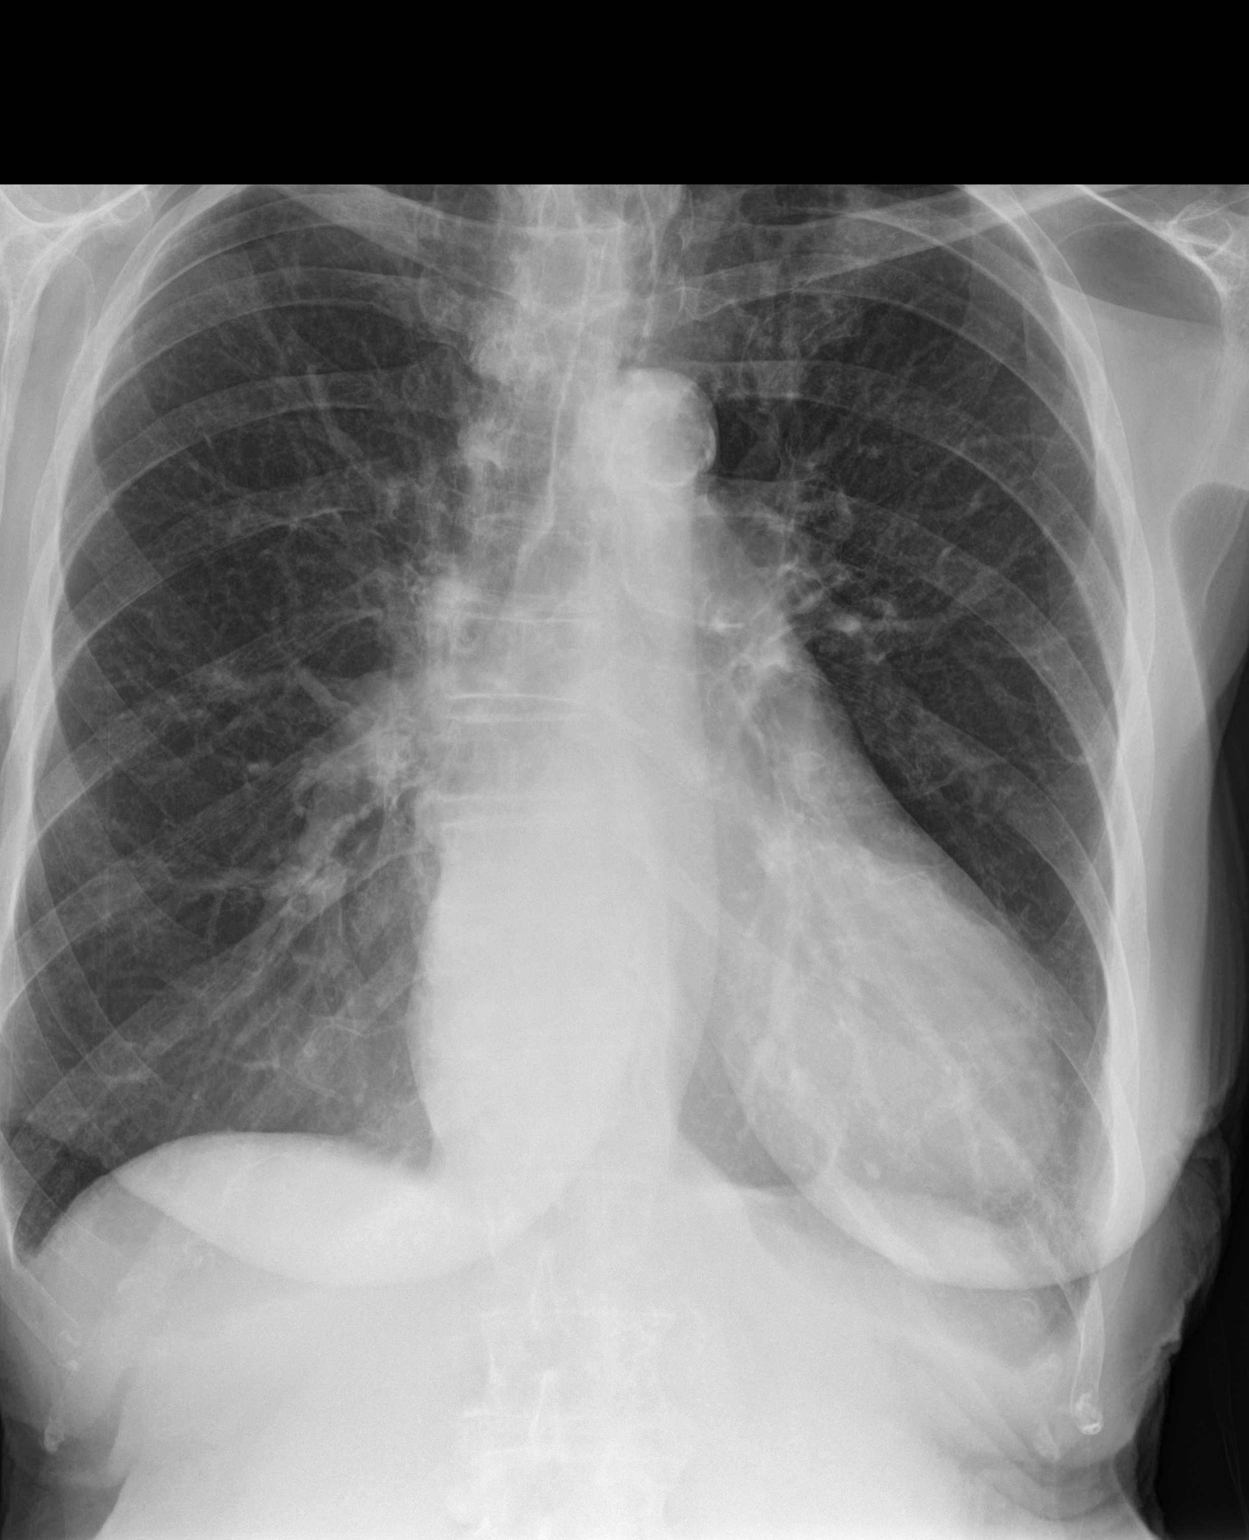

[chest lat]
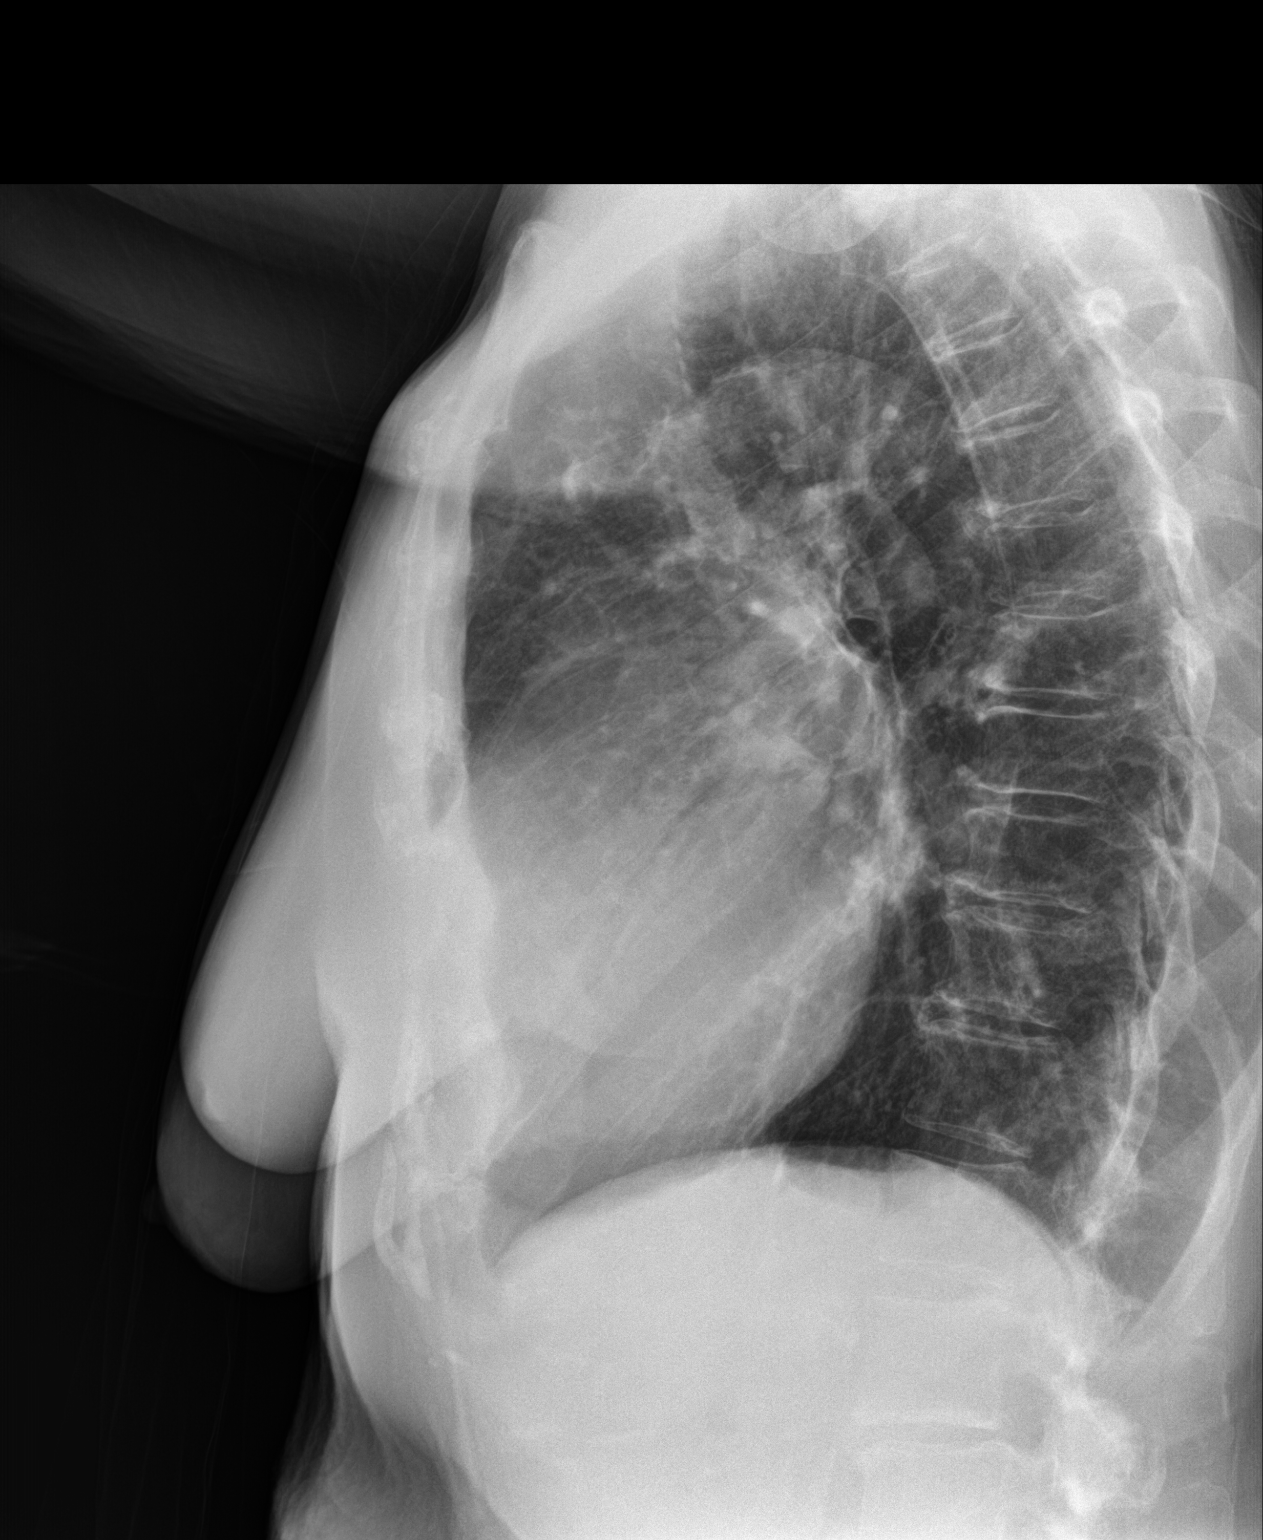

[2 of 2 positions shown; findings below may reference images not displayed]

FINDINGS: Mild cardiomegaly has increased since 4784. Other mediastinal
contours remain normal. Calcified aortic atherosclerosis. Visualized
tracheal air column is within normal limits. Upper limits of normal
to mildly hyperinflated lung volumes. No pneumothorax, pulmonary
edema, pleural effusion or confluent pulmonary opacity. Osteopenia.
Grade 1 chronic spondylolisthesis in the upper lumbar spine with
associated severe facet degeneration. No acute osseous abnormality
identified.
IMPRESSION: Mild cardiomegaly appears increased since 4784. No acute
cardiopulmonary abnormality.

## 2017-08-30 ENCOUNTER — Ambulatory Visit: Payer: Medicare Other | Admitting: Family Medicine

## 2017-09-10 ENCOUNTER — Ambulatory Visit (INDEPENDENT_AMBULATORY_CARE_PROVIDER_SITE_OTHER): Payer: Medicare Other | Admitting: Family Medicine

## 2017-09-10 ENCOUNTER — Encounter: Payer: Self-pay | Admitting: Family Medicine

## 2017-09-10 VITALS — BP 118/62 | HR 52 | Temp 97.9°F | Wt 140.0 lb

## 2017-09-10 DIAGNOSIS — I071 Rheumatic tricuspid insufficiency: Secondary | ICD-10-CM

## 2017-09-10 DIAGNOSIS — Z87440 Personal history of urinary (tract) infections: Secondary | ICD-10-CM

## 2017-09-10 DIAGNOSIS — I482 Chronic atrial fibrillation, unspecified: Secondary | ICD-10-CM

## 2017-09-10 DIAGNOSIS — D638 Anemia in other chronic diseases classified elsewhere: Secondary | ICD-10-CM

## 2017-09-10 DIAGNOSIS — I5032 Chronic diastolic (congestive) heart failure: Secondary | ICD-10-CM

## 2017-09-10 DIAGNOSIS — I420 Dilated cardiomyopathy: Secondary | ICD-10-CM

## 2017-09-10 DIAGNOSIS — I7410 Embolism and thrombosis of unspecified parts of aorta: Secondary | ICD-10-CM | POA: Diagnosis not present

## 2017-09-10 DIAGNOSIS — I272 Pulmonary hypertension, unspecified: Secondary | ICD-10-CM | POA: Diagnosis not present

## 2017-09-10 LAB — POC URINALSYSI DIPSTICK (AUTOMATED)
Glucose, UA: NEGATIVE
KETONES UA: NEGATIVE
Nitrite, UA: POSITIVE
PH UA: 6.5 (ref 5.0–8.0)
SPEC GRAV UA: 1.015 (ref 1.010–1.025)
UROBILINOGEN UA: 0.2 U/dL

## 2017-09-10 NOTE — Progress Notes (Signed)
BP 118/62 (BP Location: Left Arm, Patient Position: Sitting, Cuff Size: Normal)   Pulse (!) 52   Temp 97.9 F (36.6 C) (Oral)   Wt 140 lb (63.5 kg)   SpO2 97% Comment: 2 L  BMI 22.43 kg/m    CC: UTI f/u visit Subjective:    Patient ID: Erica Bridges, female    DOB: [birth year], 83 y.o.   MRN: [id number]  HPI: Erica Bridges is a 83 y.o. female presenting on 09/10/2017 for UTI follow-up (Denies any sxs.)   Here with caregiver.  See prior note for details.  Recent R TIA with AA mural thrombus, chronic afib now on aspirin 325mg  (prior BRBPR while on eliquis).   Denies UTI sxs of dysuria, urgency, hematuria, abd pain, flank pain, fevers/chills, nausea/vomiting.   O2 started during recent SNF rehab stay. Erica Bridges is currently using 24/7.   Recent UTI treatments: Citrobacter UTI by GYN with cipro 02/2017.  UCx 05/2017 - [facility] - received bactrim DS 3d course.   Relevant past medical, surgical, family and social history reviewed and updated as indicated. Interim medical history since our last visit reviewed. Allergies and medications reviewed and updated. Outpatient Medications Prior to Visit  Medication Sig Dispense Refill  . aspirin EC 325 MG tablet Take 1 tablet (325 mg total) by mouth daily. 30 tablet 0  . Calcium-Magnesium-Vitamin D (CALCIUM 1200+D3 PO) Take 1 tablet by mouth daily.    . cholecalciferol (VITAMIN D) 1000 UNITS tablet Take 1,000 Units by mouth daily.    . Coenzyme Q10 200 MG capsule Take 200 mg by mouth daily.    . digoxin (LANOXIN) 0.125 MG tablet Take 1 tablet (0.125 mg total) by mouth daily. 90 tablet 1  . ferrous sulfate 325 (65 FE) MG tablet Take 1 tablet (325 mg total) by mouth daily with breakfast.    . furosemide (LASIX) 40 MG tablet Take 1 tablet (40 mg total) by mouth daily. 30 tablet 6  . guaiFENesin (MUCINEX) 600 MG 12 hr tablet Take 1 tablet (600 mg total) by mouth 2 (two) times daily. 60 tablet 1  . hydrocortisone (PROCTO-MED HC) 2.5 % rectal cream Place 1  application rectally 2 (two) times daily. 30 g 0  . Loperamide HCl (ANTI-DIARRHEAL PO) Take 0.5 tablets by mouth daily.    . magnesium oxide (MAG-OX) 400 MG tablet Take 400 mg by mouth daily.    . metoprolol tartrate (LOPRESSOR) 25 MG tablet Take 0.5 tablets (12.5 mg total) by mouth 2 (two) times daily. 90 tablet 3  . potassium chloride (K-DUR,KLOR-CON) 10 MEQ tablet Take 1 tablet (10 mEq total) by mouth daily. 90 tablet 3  . pravastatin (PRAVACHOL) 40 MG tablet Take 1 tablet (40 mg total) by mouth daily. 90 tablet 3  . Sennosides-Docusate Sodium (TGT SENNA LAX/STOOL SOFTENER PO) Take 0.5 tablets by mouth daily.    Marland Kitchen spironolactone (ALDACTONE) 25 MG tablet Take 1 tablet (25 mg total) by mouth daily. 90 tablet 3  . tiotropium (SPIRIVA) 18 MCG inhalation capsule Place 1 capsule (18 mcg total) into inhaler and inhale daily. 90 capsule 3  . sulfamethoxazole-trimethoprim (BACTRIM DS,SEPTRA DS) 800-160 MG tablet Take 1 tablet by mouth 2 (two) times daily. 6 tablet 0   No facility-administered medications prior to visit.      Per HPI unless specifically indicated in ROS section below Review of Systems     Objective:    BP 118/62 (BP Location: Left Arm, Patient Position: Sitting, Cuff Size: Normal)  Pulse (!) 52   Temp 97.9 F (36.6 C) (Oral)   Wt 140 lb (63.5 kg)   SpO2 97% Comment: 2 L  BMI 22.43 kg/m   Wt Readings from Last 3 Encounters:  09/10/17 140 lb (63.5 kg)  06/01/17 127 lb 8 oz (57.8 kg)  05/28/17 124 lb 4 oz (56.4 kg)    Physical Exam  Constitutional: Erica Bridges appears well-developed and well-nourished. No distress.  HENT:  Head: Normocephalic and atraumatic.  Dry MM  Cardiovascular: Normal rate, regular rhythm and intact distal pulses.  Murmur (3/6 systolic) heard. Pulmonary/Chest: Effort normal and breath sounds normal. No respiratory distress. Erica Bridges has no wheezes. Erica Bridges has no rales.  Musculoskeletal: Erica Bridges exhibits no edema.  Skin: Skin is warm and dry. No rash noted.    Psychiatric: Erica Bridges has a normal mood and affect.  Nursing note and vitals reviewed.  Results for orders placed or performed in visit on 09/10/17  POCT Urinalysis Dipstick (Automated)  Result Value Ref Range   Color, UA yellow    Clarity, UA cloudy    Glucose, UA negative    Bilirubin, UA 1+    Ketones, UA negative    Spec Grav, UA 1.015 1.010 - 1.025   Blood, UA 1+    pH, UA 6.5 5.0 - 8.0   Protein, UA trace    Urobilinogen, UA 0.2 0.2 or 1.0 E.U./dL   Nitrite, UA positive    Leukocytes, UA Large (3+) (A) Negative   Lab Results  Component Value Date   CREATININE 1.02 05/03/2017       Assessment & Plan:   Problem List Items Addressed This Visit    Anemia    Will be due for f/u labs next visit - anemia panel with iron studies.      Aortic mural thrombus (HCC)    Reviewed AC options (eliquis vs aspirin). Will stay on aspirin at this time.       Atrial fibrillation ([place])    Reviewed AC options (eliquis vs aspirin). Will stay on aspirin at this time.       Chronic diastolic CHF (congestive heart failure) (HCC) - Primary    Weight gain noted however seems euvolemic, does not endorse dyspnea orthopnea or other CHF sxs. Anticipate weight gain partly attributable to improved nutrition status. dCHF with dilated cardiomyopathy contributing to hypoxia. Erica Bridges endorses periods of hypoxia at night when her nasal cannula falls off leading her to awaken shortwinded.  O2 sat checked at rest today off supplemental oxygen - maintains at >90%. I did advise ok to remain off supplemental oxygen while at rest, but to use with exertion and with sleep. Pt agrees with plan.       Dilated cardiomyopathy ([place])   History of UTI    H/o this, today UA abnormal however Erica Bridges denies any UTI sxs. Anticipate bacterial colonization not true infection therefore will not treat.       Relevant Orders   POCT Urinalysis Dipstick (Automated) (Completed)   Pulmonary hypertension (HCC)   Severe tricuspid valve  regurgitation       Follow up plan: Return in about 4 months (around 01/09/2018) for follow up visit.  Ria Bush, MD

## 2017-09-10 NOTE — Patient Instructions (Addendum)
Good to see you today. You are doing well today. Ok to change supplemental oxygen to 2L with exertion and at night, ok to stay off oxygen when at rest as long as you don't feel short winded.  Continue to drink plenty of water.   Return in 4 months for follow up visit.

## 2017-09-11 DIAGNOSIS — I272 Pulmonary hypertension, unspecified: Secondary | ICD-10-CM | POA: Insufficient documentation

## 2017-09-11 DIAGNOSIS — Z87440 Personal history of urinary (tract) infections: Secondary | ICD-10-CM | POA: Insufficient documentation

## 2017-09-11 DIAGNOSIS — I071 Rheumatic tricuspid insufficiency: Secondary | ICD-10-CM | POA: Insufficient documentation

## 2017-09-11 DIAGNOSIS — I420 Dilated cardiomyopathy: Secondary | ICD-10-CM | POA: Insufficient documentation

## 2017-09-11 NOTE — Assessment & Plan Note (Signed)
Will be due for f/u labs next visit - anemia panel with iron studies.

## 2017-09-11 NOTE — Assessment & Plan Note (Signed)
H/o this, today UA abnormal however she denies any UTI sxs. Anticipate bacterial colonization not true infection therefore will not treat.

## 2017-09-11 NOTE — Assessment & Plan Note (Signed)
Reviewed AC options (eliquis vs aspirin). Will stay on aspirin at this time.

## 2017-09-11 NOTE — Assessment & Plan Note (Addendum)
Weight gain noted however seems euvolemic, does not endorse dyspnea orthopnea or other CHF sxs. Anticipate weight gain partly attributable to improved nutrition status. dCHF with dilated cardiomyopathy contributing to hypoxia. She endorses periods of hypoxia at night when her nasal cannula falls off leading her to awaken shortwinded.  O2 sat checked at rest today off supplemental oxygen - maintains at >90%. I did advise ok to remain off supplemental oxygen while at rest, but to use with exertion and with sleep. Pt agrees with plan.

## 2017-09-29 DIAGNOSIS — R531 Weakness: Secondary | ICD-10-CM | POA: Diagnosis not present

## 2017-09-30 ENCOUNTER — Ambulatory Visit: Payer: Self-pay | Admitting: General Practice

## 2017-10-27 ENCOUNTER — Ambulatory Visit: Payer: Medicare Other | Admitting: Family Medicine

## 2017-10-27 ENCOUNTER — Telehealth: Payer: Self-pay | Admitting: Family Medicine

## 2017-10-28 NOTE — Telephone Encounter (Signed)
Called home. Spoke with son in law to express my condolences. Children are at funeral home now.  She passed away while getting into car with daughter to come to office visit. Had been having possible stroke symptoms.

## 2017-11-19 DIAGNOSIS — 419620001 Death: Secondary | SNOMED CT | POA: Diagnosis not present

## 2017-11-19 NOTE — Telephone Encounter (Signed)
EMS called after arriving at the pt's home and finding the pt deceased. Pt's PCP is Dr. Danise Mina, EMS needing verbal consent that death certificate was going to be signed by provider. Attempted to call office for approximately 15 min with no answer to the back door line or the flow coordinator.On call [facility] provider, Dr. Ronnald Ramp contacted and conference call initiated with EMS.

## 2017-11-19 DEATH — deceased

## 2018-01-10 ENCOUNTER — Ambulatory Visit: Payer: Medicare Other | Admitting: Family Medicine

## 2018-03-05 IMAGING — CR DG CHEST 2V
2 series · 2 of 2 positions shown · non-contrast
Comparison: 01/17/2016

CLINICAL DATA: Hypoxia.

EXAM:
CHEST  2 VIEW

[chest pa]
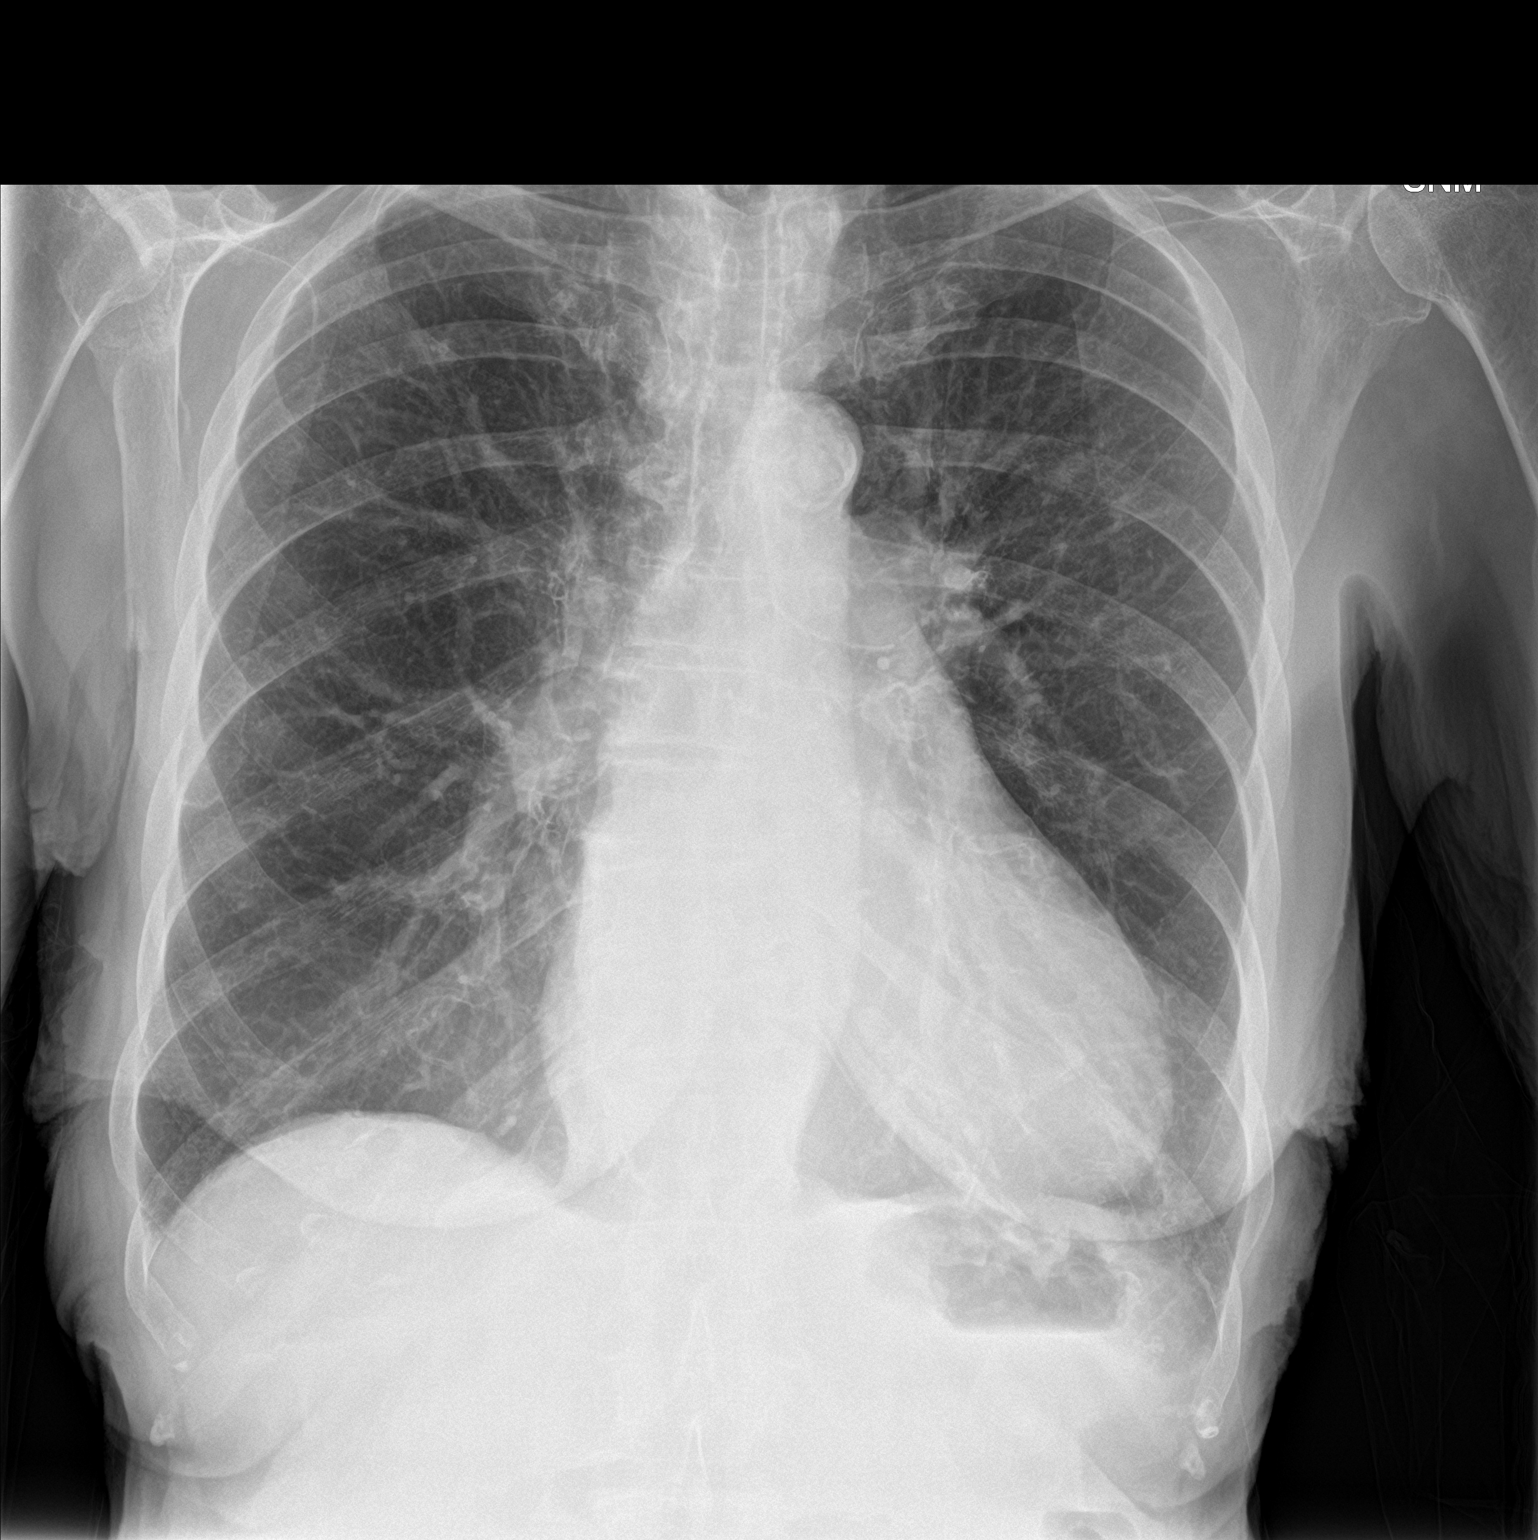

[chest lat]
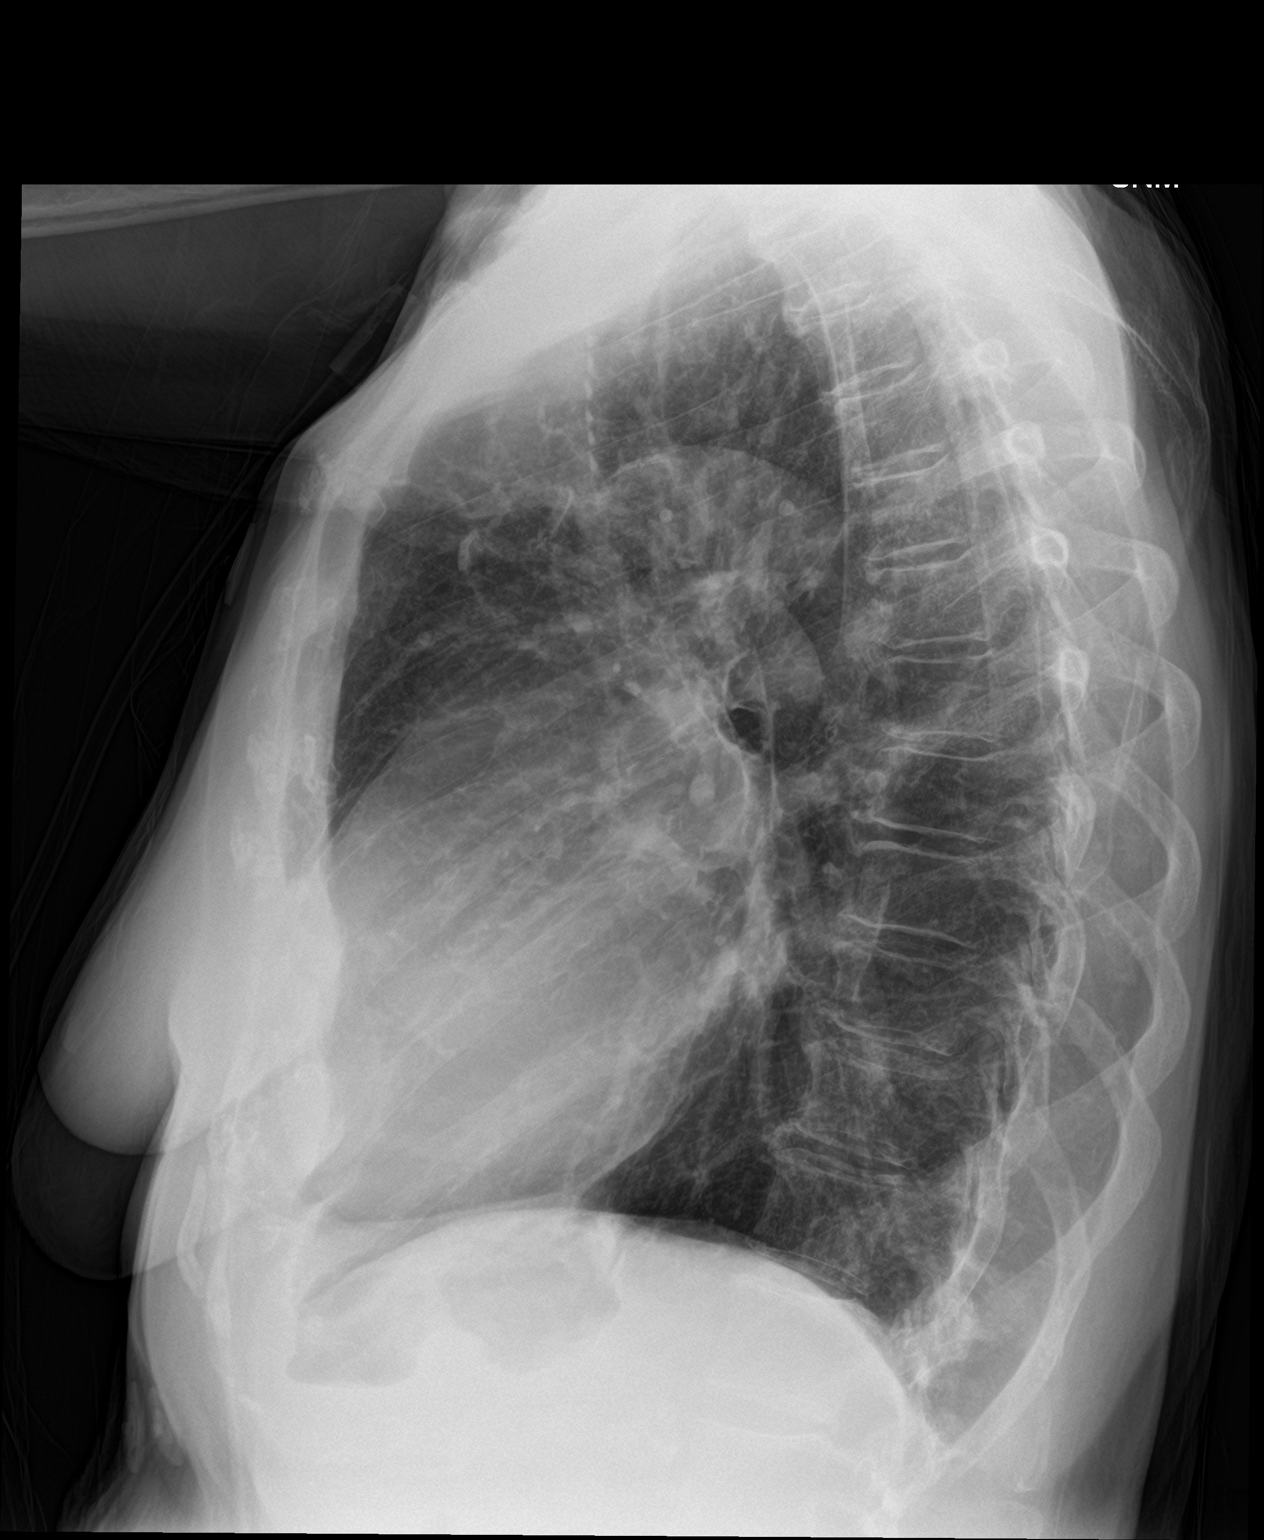

[2 of 2 positions shown; findings below may reference images not displayed]

FINDINGS: The heart is enlarged but stable. Stable tortuosity and
calcification of the thoracic aorta. Chronic emphysematous and
bronchitic type lung changes but no infiltrates, edema or effusions.
No worrisome pulmonary lesions. The bony thorax is intact.
IMPRESSION: Stable mild cardiac enlargement and aortic atherosclerosis.

Stable emphysematous changes and pulmonary scarring. No acute
overlying pulmonary process.

## 2018-05-21 IMAGING — CT CT ANGIO NECK
2 of 7 series · 8 of 33 positions shown · IV contrast (OMNI 350)
Comparison: CT and MRI head October 21, 2016, MRA head October 21, 2016

CLINICAL DATA: Transient ischemic attack. History of atrial
fibrillation, hypertension, hyperlipidemia.

EXAM:
CT ANGIOGRAPHY NECK
TECHNIQUE: Multidetector CT imaging of the neck was performed using the
standard protocol during bolus administration of intravenous
contrast. Multiplanar CT image reconstructions and MIPs were
obtained to evaluate the vascular anatomy. Carotid stenosis
measurements (when applicable) are obtained utilizing NASCET
criteria, using the distal internal carotid diameter as the
denominator.
CONTRAST:  50 cc Isovue 370

[Series 4: cta neck · axial · 0.39mm/px · z∈[-251,-153]mm · 2 of 148 slices shown]
[im 50/148  soft-tissue]
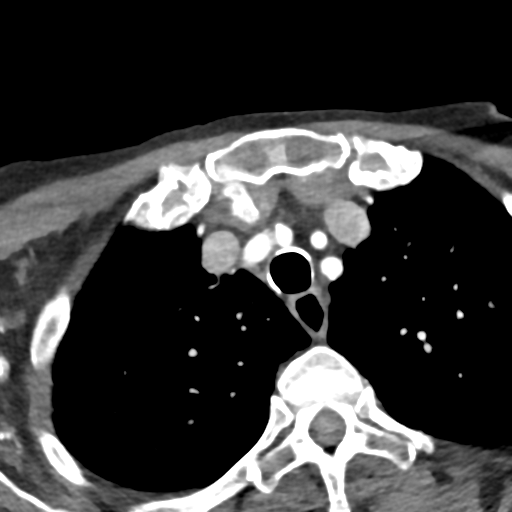
[im 99/148  soft-tissue]
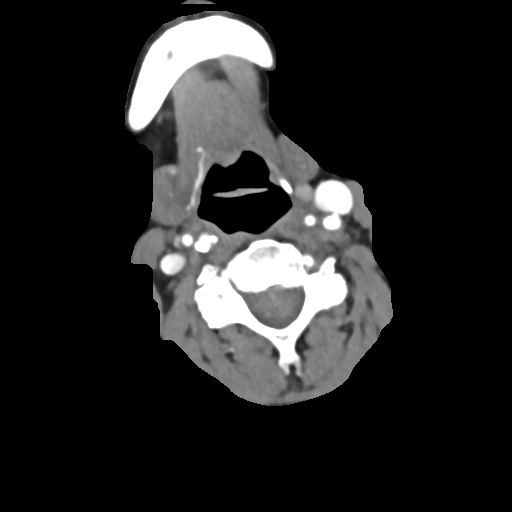

[Series 6: cta neck axial · axial · 0.39mm/px · z∈[-306,-96]mm · 6 of 295 slices shown]
[im 43/295  soft-tissue]
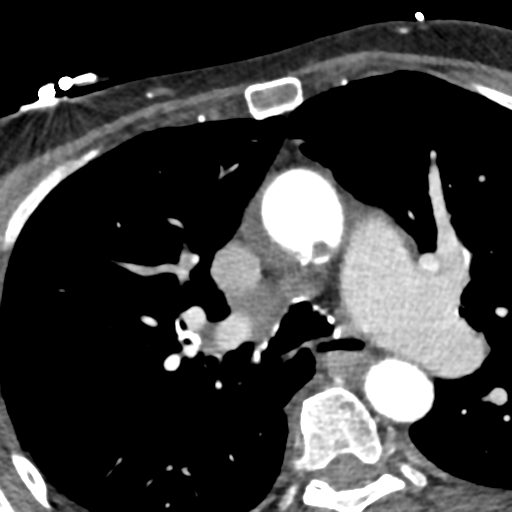
[im 85/295  bone]
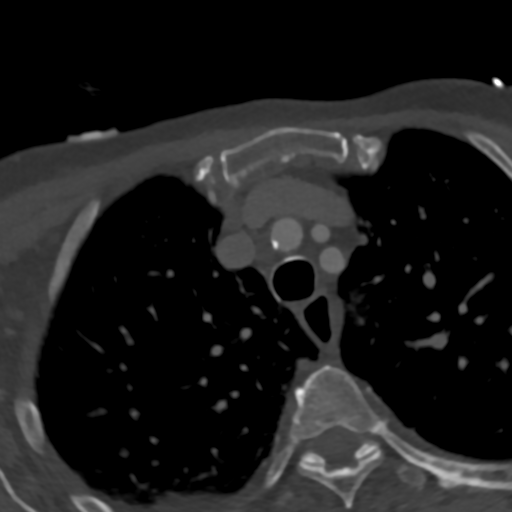
[im 127/295  soft-tissue]
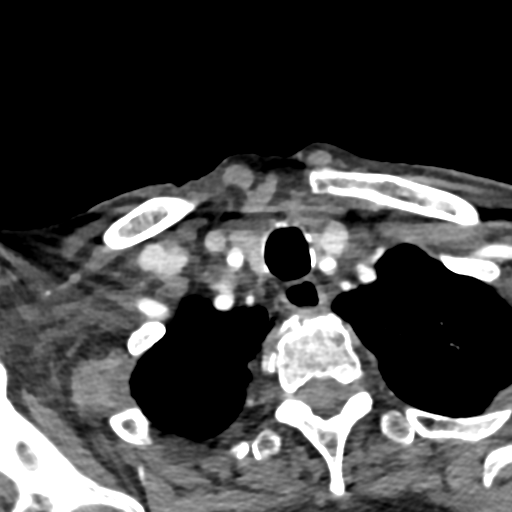
[im 169/295  bone]
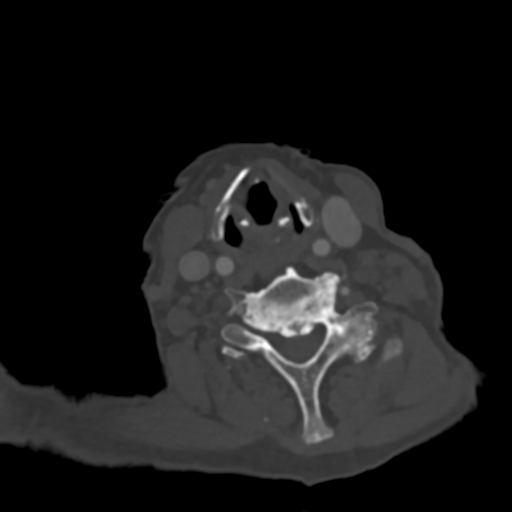
[im 211/295  soft-tissue]
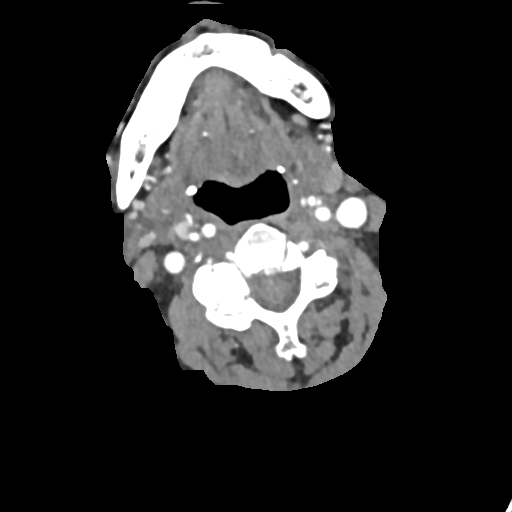
[im 253/295  bone]
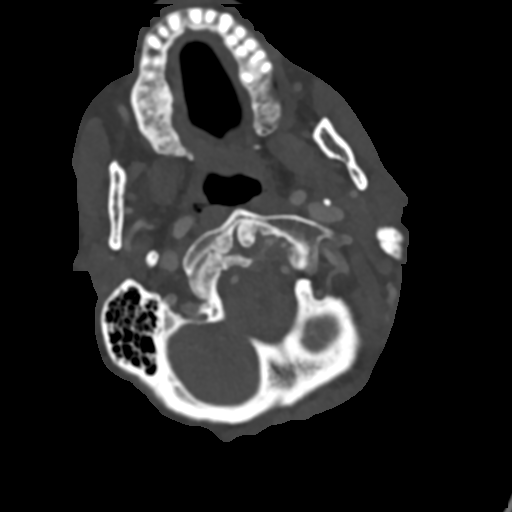

[8 of 33 positions shown; findings below may reference images not displayed]

FINDINGS: AORTIC ARCH: 7 x 7 mm posterior ascending thoracic aorta thrombus
extends 2 cm into the central lumen. Moderate calcific
atherosclerosis of the aortic arch. Severe stenosis LEFT subclavian
artery origin. Common origin of the innominate and LEFT Common
carotid artery.

RIGHT CAROTID SYSTEM: Common carotid artery is widely patent,
coursing in a straight line fashion. Severe eccentric calcific
atherosclerosis results in less than 50% stenosis. Mild luminal
irregularity attributable to atherosclerosis cervical internal
carotid artery.

LEFT CAROTID SYSTEM: Common carotid artery is widely patent,
coursing in a straight line fashion. Mild eccentric calcific
atherosclerosis. Normal appearance of the carotid bifurcation
without hemodynamically significant stenosis by NASCET criteria.
Patent internal carotid artery.

VERTEBRAL ARTERIES:Severe stenosis LEFT vertebral artery origin due
to the calcific atherosclerosis. Mild stenosis RIGHT vertebral
artery origin. Bilateral vertebral arteries are patent. Extrinsic
deformity due to degenerative cervical spine.

SKELETON: No acute osseous process though bone windows have not been
submitted. Cervicothoracic levoscoliosis and multi level severe
degenerative changes cervical spine. Moderate canal stenosis C6-7.
Severe C3-4 thru C6-7 neural foraminal narrowing.

OTHER NECK: Soft tissues of the neck are nonacute though, not
tailored for evaluation. Mild centrilobular emphysema in the
included lung apices. Main pulmonary artery is 3.4 cm in transaxial
dimension, associated with chronic pulmonary arterial hypertension .
IMPRESSION: 1. Ascending aortic 7 x 7 x 20 mm mural thrombus with potential for
embolism.
2. Severe stenosis LEFT subclavian and LEFT vertebral artery
origins. Mild stenosis RIGHT vertebral artery origin.
3. Severe atherosclerosis without hemodynamically significant
stenosis of the internal carotid artery's.
4. Moderate canal stenosis C6-7. Severe C3-4 thru C6-7 neural
foraminal narrowing.

These results will be called to the ordering clinician or
representative by the Radiologist Assistant, and communication
documented in the [facility] Dashboard
